# Patient Record
Sex: Male | Born: 1960
Health system: Southern US, Community
[De-identification: ages and names within clinical notes are randomized; demographics above are authoritative.]

## PROBLEM LIST (undated history)

## (undated) DIAGNOSIS — R200 Anesthesia of skin: Secondary | ICD-10-CM

## (undated) DIAGNOSIS — J45909 Unspecified asthma, uncomplicated: Secondary | ICD-10-CM

## (undated) DIAGNOSIS — R202 Paresthesia of skin: Secondary | ICD-10-CM

## (undated) DIAGNOSIS — K219 Gastro-esophageal reflux disease without esophagitis: Secondary | ICD-10-CM

## (undated) DIAGNOSIS — D229 Melanocytic nevi, unspecified: Secondary | ICD-10-CM

## (undated) DIAGNOSIS — K649 Unspecified hemorrhoids: Secondary | ICD-10-CM

## (undated) DIAGNOSIS — E229 Hyperfunction of pituitary gland, unspecified: Secondary | ICD-10-CM

## (undated) DIAGNOSIS — L309 Dermatitis, unspecified: Secondary | ICD-10-CM

## (undated) DIAGNOSIS — T8859XA Other complications of anesthesia, initial encounter: Secondary | ICD-10-CM

## (undated) DIAGNOSIS — G4730 Sleep apnea, unspecified: Secondary | ICD-10-CM

## (undated) DIAGNOSIS — C6290 Malignant neoplasm of unspecified testis, unspecified whether descended or undescended: Secondary | ICD-10-CM

## (undated) DIAGNOSIS — F411 Generalized anxiety disorder: Secondary | ICD-10-CM

## (undated) DIAGNOSIS — B009 Herpesviral infection, unspecified: Secondary | ICD-10-CM

## (undated) DIAGNOSIS — T4145XA Adverse effect of unspecified anesthetic, initial encounter: Secondary | ICD-10-CM

## (undated) HISTORY — DX: Unspecified hemorrhoids: K64.9

## (undated) HISTORY — DX: Malignant neoplasm of unspecified testis, unspecified whether descended or undescended: C62.90

## (undated) HISTORY — DX: Hyperfunction of pituitary gland, unspecified: E22.9

## (undated) HISTORY — PX: KNEE ARTHROPLASTY: SHX992

## (undated) HISTORY — DX: Melanocytic nevi, unspecified: D22.9

## (undated) HISTORY — DX: Dermatitis, unspecified: L30.9

## (undated) HISTORY — DX: Anesthesia of skin: R20.0

## (undated) HISTORY — DX: Unspecified asthma, uncomplicated: J45.909

## (undated) HISTORY — DX: Herpesviral infection, unspecified: B00.9

## (undated) HISTORY — PX: ORCHIECTOMY: SHX2116

## (undated) HISTORY — PX: LASIK: SHX215

## (undated) HISTORY — DX: Sleep apnea, unspecified: G47.30

## (undated) HISTORY — DX: Generalized anxiety disorder: F41.1

## (undated) HISTORY — DX: Paresthesia of skin: R20.2

## (undated) HISTORY — PX: APPENDECTOMY: SHX54

---

## 2003-07-01 ENCOUNTER — Ambulatory Visit (HOSPITAL_COMMUNITY): Admission: RE | Admit: 2003-07-01 | Discharge: 2003-07-01 | Payer: Self-pay | Admitting: Family Medicine

## 2003-07-01 ENCOUNTER — Encounter: Payer: Self-pay | Admitting: Family Medicine

## 2006-10-11 DIAGNOSIS — D229 Melanocytic nevi, unspecified: Secondary | ICD-10-CM

## 2006-10-11 HISTORY — DX: Melanocytic nevi, unspecified: D22.9

## 2007-03-07 ENCOUNTER — Encounter (INDEPENDENT_AMBULATORY_CARE_PROVIDER_SITE_OTHER): Payer: Self-pay | Admitting: *Deleted

## 2007-03-09 ENCOUNTER — Ambulatory Visit: Payer: Self-pay | Admitting: Internal Medicine

## 2008-04-25 ENCOUNTER — Ambulatory Visit: Payer: Self-pay | Admitting: Internal Medicine

## 2008-05-01 ENCOUNTER — Encounter (INDEPENDENT_AMBULATORY_CARE_PROVIDER_SITE_OTHER): Payer: Self-pay | Admitting: *Deleted

## 2008-06-12 ENCOUNTER — Encounter: Payer: Self-pay | Admitting: Internal Medicine

## 2009-02-07 ENCOUNTER — Encounter (INDEPENDENT_AMBULATORY_CARE_PROVIDER_SITE_OTHER): Payer: Self-pay | Admitting: *Deleted

## 2009-08-14 ENCOUNTER — Encounter: Admission: RE | Admit: 2009-08-14 | Discharge: 2009-08-14 | Payer: Self-pay | Admitting: Orthopedic Surgery

## 2009-08-20 ENCOUNTER — Ambulatory Visit (HOSPITAL_COMMUNITY): Admission: RE | Admit: 2009-08-20 | Discharge: 2009-08-20 | Payer: Self-pay | Admitting: Urology

## 2009-08-20 ENCOUNTER — Encounter (INDEPENDENT_AMBULATORY_CARE_PROVIDER_SITE_OTHER): Payer: Self-pay | Admitting: Urology

## 2009-08-25 ENCOUNTER — Ambulatory Visit: Payer: Self-pay | Admitting: Oncology

## 2009-08-26 ENCOUNTER — Ambulatory Visit: Admission: RE | Admit: 2009-08-26 | Discharge: 2009-10-01 | Payer: Self-pay | Admitting: Radiation Oncology

## 2009-12-11 ENCOUNTER — Ambulatory Visit: Payer: Self-pay | Admitting: Family Medicine

## 2009-12-11 DIAGNOSIS — C6290 Malignant neoplasm of unspecified testis, unspecified whether descended or undescended: Secondary | ICD-10-CM | POA: Insufficient documentation

## 2010-01-16 ENCOUNTER — Ambulatory Visit: Payer: Self-pay | Admitting: Internal Medicine

## 2010-01-16 DIAGNOSIS — E229 Hyperfunction of pituitary gland, unspecified: Secondary | ICD-10-CM | POA: Insufficient documentation

## 2010-01-16 LAB — CONVERTED CEMR LAB
FSH: 24.5 milliintl units/mL — ABNORMAL HIGH (ref 1.4–18.1)
TSH: 1.29 microintl units/mL (ref 0.35–5.50)
Testosterone: 346.66 ng/dL — ABNORMAL LOW (ref 350.00–890.00)

## 2010-02-04 ENCOUNTER — Encounter (INDEPENDENT_AMBULATORY_CARE_PROVIDER_SITE_OTHER): Payer: Self-pay | Admitting: *Deleted

## 2010-02-09 ENCOUNTER — Ambulatory Visit: Payer: Self-pay | Admitting: Internal Medicine

## 2010-02-12 ENCOUNTER — Encounter: Payer: Self-pay | Admitting: Family Medicine

## 2010-02-12 ENCOUNTER — Ambulatory Visit: Payer: Self-pay | Admitting: Internal Medicine

## 2010-02-27 ENCOUNTER — Ambulatory Visit: Payer: Self-pay | Admitting: Family Medicine

## 2010-02-27 LAB — CONVERTED CEMR LAB: Testosterone: 271.39 ng/dL — ABNORMAL LOW (ref 350.00–890.00)

## 2010-03-05 ENCOUNTER — Encounter: Payer: Self-pay | Admitting: Family Medicine

## 2010-03-23 ENCOUNTER — Encounter: Payer: Self-pay | Admitting: Family Medicine

## 2010-03-25 ENCOUNTER — Ambulatory Visit: Payer: Self-pay | Admitting: Cardiology

## 2010-04-28 ENCOUNTER — Ambulatory Visit: Payer: Self-pay | Admitting: Family Medicine

## 2010-04-29 LAB — CONVERTED CEMR LAB: Testosterone: 514.68 ng/dL (ref 350.00–890.00)

## 2010-08-11 ENCOUNTER — Telehealth (INDEPENDENT_AMBULATORY_CARE_PROVIDER_SITE_OTHER): Payer: Self-pay | Admitting: *Deleted

## 2010-09-10 ENCOUNTER — Encounter: Payer: Self-pay | Admitting: Family Medicine

## 2010-09-10 ENCOUNTER — Ambulatory Visit: Payer: Self-pay | Admitting: Internal Medicine

## 2010-09-18 ENCOUNTER — Ambulatory Visit: Payer: Self-pay | Admitting: Family Medicine

## 2010-09-21 ENCOUNTER — Encounter: Payer: Self-pay | Admitting: Family Medicine

## 2010-09-21 LAB — CONVERTED CEMR LAB: Testosterone: 554.87 ng/dL (ref 350.00–890.00)

## 2010-09-24 ENCOUNTER — Ambulatory Visit: Payer: Self-pay | Admitting: Internal Medicine

## 2010-10-13 ENCOUNTER — Telehealth (INDEPENDENT_AMBULATORY_CARE_PROVIDER_SITE_OTHER): Payer: Self-pay | Admitting: *Deleted

## 2010-11-01 ENCOUNTER — Encounter: Payer: Self-pay | Admitting: Urology

## 2010-11-10 NOTE — Miscellaneous (Signed)
  Clinical Lists Changes  Orders: Added new Referral order of Radiology Referral (Radiology) - Signed 

## 2010-11-10 NOTE — Miscellaneous (Signed)
Summary: PPD  Clinical Lists Changes  Orders: Added new Service order of TB Skin Test [phone]) - Signed Added new Service order of Admin 1st Vaccine (34742) - Signed Observations: Added new observation of TB PPDRESULT: negative (02/04/2010 9:56) Added new observation of PPD RESULT: < 5mm (02/04/2010 9:56) Added new observation of TB-PPD RDDTE: 02/06/2010 (02/04/2010 9:56) Added new observation of TB-PPD LOT#: [id number] (02/04/2010 9:56) Added new observation of TB-PPD EXP: 02/16/2012 (02/04/2010 9:56) Added new observation of TB-PPD BY: Shary Decamp (02/04/2010 9:56) Added new observation of TB-PPD RTE: ID (02/04/2010 9:56) Added new observation of TB-PPD DSE: 0.1 ml (02/04/2010 9:56) Added new observation of TB-PPD SITE: right forearm (02/04/2010 9:56) Added new observation of TB-PPD: PPD (02/04/2010 9:56)      Immunizations Administered:  PPD Skin Test:    Vaccine Type: PPD    Site: right forearm    Dose: 0.1 ml    Route: ID    Given by: Shary Decamp    Exp. Date: 02/16/2012    Lot #: [id number]  PPD Results    Date of reading: 02/06/2010    Results: < 5mm    Interpretation: negative

## 2010-11-10 NOTE — Miscellaneous (Signed)
Summary: Orders Update  Clinical Lists Changes  Orders: Added new Service order of Venipuncture (16109) - Signed Added new Test order of TLB-Testosterone, Total (84403-TESTO) - Signed

## 2010-11-10 NOTE — Letter (Signed)
Summary: [facility]  [facility]   Imported By: Lanelle Bal 03/16/2010 12:40:44  _____________________________________________________________________  External Attachment:    Type:   Image     Comment:   External Document

## 2010-11-10 NOTE — Assessment & Plan Note (Signed)
Summary: ppd/swh  Nurse Visit

## 2010-11-10 NOTE — Progress Notes (Signed)
Summary: RX Request  Phone Note Refill Request   Refills Requested: Medication #1:  Celebrex 100mg    Notes: 1 po prn #60 Per Dr.Hopper ok to fill med./Chrae [facility] CMA  August 11, 2010 4:13 PM      New/Updated Medications: CELEBREX 100 MG CAPS (CELECOXIB) 1 by mouth as needed Prescriptions: CELEBREX 100 MG CAPS (CELECOXIB) 1 by mouth as needed  #60 x 0   Entered by:   Shonna Chock CMA   Authorized by:   Marga Melnick MD   Signed by:   Shonna Chock CMA on 08/11/2010   Method used:   Electronically to        [facility] [address] (retail)       [address]       [place], [place]  [address]       Ph: [phone] or [phone]       Fax: [phone]   RxID:   [id number]

## 2010-11-10 NOTE — Miscellaneous (Signed)
Summary: Orders Update  Clinical Lists Changes  Orders: Added new Test order of T-2 View CXR (71020TC) - Signed 

## 2010-11-10 NOTE — Miscellaneous (Signed)
Summary: BONE DENSITY  Clinical Lists Changes  Orders: Added new Test order of T-Bone Densitometry (77080) - Signed Added new Test order of T-Lumbar Vertebral Assessment (77082) - Signed 

## 2010-11-10 NOTE — Miscellaneous (Signed)
  Clinical Lists Changes  Problems: Added new problem of HYPERPROLACTINEMIA (ICD-253.1) Orders: Added new Test order of TLB-Prolactin (84146-PROL) - Signed Added new Test order of TLB-TSH (Thyroid Stimulating Hormone) (84443-TSH) - Signed Added new Test order of TLB-FSH (Follicle Stimulating Hormone) (83001-FSH) - Signed Added new Test order of TLB-Testosterone, Total (84403-TESTO) - Signed

## 2010-11-10 NOTE — Miscellaneous (Signed)
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

## 2010-11-12 NOTE — Miscellaneous (Signed)
Summary: Orders Update   Clinical Lists Changes  Orders: Added new Referral order of Radiology Referral (Radiology) - Signed 

## 2010-11-12 NOTE — Letter (Signed)
Summary: [facility]  [facility]   Imported By: Lanelle Bal 09/21/2010 11:24:19  _____________________________________________________________________  External Attachment:    Type:   Image     Comment:   External Document

## 2010-11-12 NOTE — Progress Notes (Signed)
Summary: Refill Request  Phone Note Refill Request Call back at [phone] Message from:  Pharmacy on October 13, 2010 10:28 AM  Refills Requested: Medication #1:  CELEBREX 100 MG CAPS 1 by mouth as needed.   Dosage confirmed as above?Dosage Confirmed   Supply Requested: 60   Last Refilled: 08/11/2010 Karin Golden on [address]   Next Appointment Scheduled: none Initial call taken by: Harold Barban,  October 13, 2010 10:29 AM    Prescriptions: CELEBREX 100 MG CAPS (CELECOXIB) 1 by mouth as needed  #60 x 1   Entered by:   Shonna Chock CMA   Authorized by:   Marga Melnick MD   Signed by:   Shonna Chock CMA on 10/13/2010   Method used:   Electronically to        [facility] [address] (retail)       [address]       [place], [place]  [address]       Ph: [phone] or [phone]       Fax: [phone]   RxID:   [id number]

## 2010-12-11 ENCOUNTER — Encounter: Payer: Self-pay | Admitting: Pulmonary Disease

## 2010-12-11 ENCOUNTER — Institutional Professional Consult (permissible substitution) (INDEPENDENT_AMBULATORY_CARE_PROVIDER_SITE_OTHER): Payer: Commercial Managed Care - PPO | Admitting: Pulmonary Disease

## 2010-12-11 DIAGNOSIS — J45909 Unspecified asthma, uncomplicated: Secondary | ICD-10-CM | POA: Insufficient documentation

## 2010-12-11 DIAGNOSIS — R0609 Other forms of dyspnea: Secondary | ICD-10-CM

## 2010-12-11 DIAGNOSIS — F411 Generalized anxiety disorder: Secondary | ICD-10-CM | POA: Insufficient documentation

## 2010-12-11 DIAGNOSIS — R0989 Other specified symptoms and signs involving the circulatory and respiratory systems: Secondary | ICD-10-CM | POA: Insufficient documentation

## 2010-12-11 DIAGNOSIS — B009 Herpesviral infection, unspecified: Secondary | ICD-10-CM | POA: Insufficient documentation

## 2010-12-15 ENCOUNTER — Encounter: Payer: Self-pay | Admitting: Pulmonary Disease

## 2010-12-16 ENCOUNTER — Institutional Professional Consult (permissible substitution): Payer: Self-pay | Admitting: Pulmonary Disease

## 2010-12-17 NOTE — Assessment & Plan Note (Addendum)
Summary: consult for possible osa   Visit Type:  Initial Consult Copy to:  Loreen Freud Primary Provider/Referring Provider:  Loreen Freud DO  CC:  Sleep Consult.  History of Present Illness: the pt is a 49y/o male who comes in today for evaluation of possible osa.  He has been having worsening snoring according to his wife, has noted an abnormal breathing pattern during sleep.  Sleeps btw 11pm-6am, and is not completely rested in am upon arising.  He awakens a lot during the night primarily with his wife being concerned about his breathing.  He does note sleep pressure during the day on his afternoons off, and with tv/movies in the evening.  He denies any sleepiness with driving.  His weight is neutral over the last 2 years,and his epworth score today is 9.    Current Medications (verified): 1)  Celebrex 100 Mg Caps (Celecoxib) .Marland Kitchen.. 1 By Mouth As Needed 2)  Androgel Pump 20.25 Mg/act (1.62%) Gel (Testosterone) .... 4 Pumps Once Daily 3)  Symbicort 80-4.5 Mcg/act Aero (Budesonide-Formoterol Fumarate) .... 2 Puffs Once Daily 4)  Proair Hfa 108 (90 Base) Mcg/act Aers (Albuterol Sulfate) .... 2 Puffs Qid As Needed 5)  Prozac 20 Mg Caps (Fluoxetine Hcl) .... Take 1 Capsule By Mouth Once A Day 6)  Alprazolam 0.5 Mg Tabs (Alprazolam) .... Take 1 Tab By Mouth At Bedtime As Needed Insomnia  Allergies (verified): 1)  ! 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 no diabetes no coronary artery disease no colon cancer no prostate  cancer Sjogren, mother colon polyps, father dementia, father, onset age 75 laryngeal cancer, father, dx age  ~ 38  melanoma , brother , dx age  ~59  atypical nevus ,niece dx age  ~ 31  Social History: Reviewed history and no changes required. 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  The patient denies shortness of breath with activity, shortness of breath at rest, productive cough, non-productive cough, coughing up blood, chest pain, irregular heartbeats, indigestion, loss of appetite, weight change, abdominal pain, sore throat, tooth/dental problems, nasal congestion/difficulty breathing through nose, sneezing, itching, ear ache, anxiety, depression, hand/feet swelling, joint stiffness or pain, rash, change in color of mucus, and fever.    Vital Signs:  Patient profile:   50 year old male Height:      70 inches Weight:      225.13 pounds BMI:     32.42 O2 Sat:      96 % on Room air Temp:     98.1 degrees F oral Pulse rate:   70 / minute BP sitting:   116 / 78  (left arm) Cuff size:   large  Vitals Entered By: Arman Filter LPN (December 10, 1608 4:21 PM)  O2 Flow:  Room air CC: Sleep Consult Comments Medications reviewed with patient Arman Filter LPN  December 11, 9602 4:21 PM  Physical Exam  General:  ow male in nad  Eyes:  PERRLA and EOMI.   Nose:  narrowed bilat, but patent no purulence or discharge seen Mouth:  mild to moderate elongation of soft palate and uvula noted. Neck:  no LN, tmg Lungs:  clear to auscultation  Heart:  rrr Extremities:  no edema or cyanosis  Neurologic:  awake, but appears mildly sleepy moves all 4    Impression & Recommendations:  Problem # 1:  SNORING (ICD-786.09)  the pt has worsening  snoring and a hx that is suggestive of osa.  He is having some degree of sleep disruption, as well as some sleep pressure during the day with periods of inactivity.  At this point, he would benefit from home sleep testing given his lack of underlying cardiopulmonary disease.  Will call with results once available.    Medications Added to Medication List This Visit: 1)  Androgel Pump 20.25 Mg/act (1.62%) Gel (Testosterone) .... 4 pumps once daily 2)  Symbicort 80-4.5 Mcg/act Aero (Budesonide-formoterol fumarate) .... 2 puffs once daily 3)  Proair Hfa 108 (90 Base) Mcg/act Aers (Albuterol sulfate) .... 2 puffs qid as needed 4)  Prozac 20 Mg Caps (Fluoxetine hcl) .... Take 1 capsule by mouth once a day 5)  Alprazolam 0.5 Mg Tabs (Alprazolam) .... Take 1 tab by mouth at bedtime as needed insomnia  Other Orders: No Charge Patient Arrived (NCPA0) (NCPA0) Misc. Referral (Misc. Ref)  Patient Instructions: 1)  will do home sleep testing to evaluate for sleep disordered breathing. 2)  work on weight loss.  Appended Document: consult for possible osa home sleep testing reveals AHI 22/hr with desat to 77%.  Discussed results with pt, and he wishes to work on weight loss and possibly consider a dental appliance.  He has discussed with his dentist.  He is to call me if he wishes to consider cpap.

## 2010-12-29 NOTE — Procedures (Signed)
Summary: [facility]   Imported By: Lester [place] 12/21/2010 08:32:39  _____________________________________________________________________  External Attachment:    Type:   Image     Comment:   External Document

## 2011-02-02 ENCOUNTER — Other Ambulatory Visit (INDEPENDENT_AMBULATORY_CARE_PROVIDER_SITE_OTHER): Payer: Commercial Managed Care - PPO

## 2011-02-02 DIAGNOSIS — R7989 Other specified abnormal findings of blood chemistry: Secondary | ICD-10-CM

## 2011-02-02 LAB — HEPATIC FUNCTION PANEL
AST: 70 U/L — ABNORMAL HIGH (ref 0–37)
Total Bilirubin: 0.6 mg/dL (ref 0.3–1.2)

## 2011-02-02 LAB — PROLACTIN: Prolactin: 4.6 ng/mL (ref 2.1–17.1)

## 2011-03-18 ENCOUNTER — Ambulatory Visit (INDEPENDENT_AMBULATORY_CARE_PROVIDER_SITE_OTHER)
Admission: RE | Admit: 2011-03-18 | Discharge: 2011-03-18 | Disposition: A | Discharge status: Home / Self Care | Payer: Commercial Managed Care - PPO | Source: Ambulatory Visit | Attending: Internal Medicine | Admitting: Internal Medicine

## 2011-03-18 ENCOUNTER — Other Ambulatory Visit: Payer: Self-pay | Admitting: Internal Medicine

## 2011-03-18 DIAGNOSIS — C6290 Malignant neoplasm of unspecified testis, unspecified whether descended or undescended: Secondary | ICD-10-CM

## 2011-03-31 ENCOUNTER — Encounter: Payer: Self-pay | Admitting: Internal Medicine

## 2011-03-31 ENCOUNTER — Ambulatory Visit (INDEPENDENT_AMBULATORY_CARE_PROVIDER_SITE_OTHER): Payer: Commercial Managed Care - PPO | Admitting: Internal Medicine

## 2011-03-31 VITALS — BP 110/72 | HR 84 | Ht 70.0 in | Wt 223.0 lb

## 2011-03-31 DIAGNOSIS — B354 Tinea corporis: Secondary | ICD-10-CM

## 2011-03-31 DIAGNOSIS — Z8371 Family history of colonic polyps: Secondary | ICD-10-CM

## 2011-03-31 DIAGNOSIS — K602 Anal fissure, unspecified: Secondary | ICD-10-CM

## 2011-03-31 DIAGNOSIS — K219 Gastro-esophageal reflux disease without esophagitis: Secondary | ICD-10-CM

## 2011-03-31 MED ORDER — AMBULATORY NON FORMULARY MEDICATION: Status: DC

## 2011-03-31 NOTE — Progress Notes (Signed)
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 He presents today regarding intermittent rectal discomfort and pruritus. Other issues identified include chronic GERD and elevated hepatic transaminases. First, he reports 3 episodes, over the past several years, of recurrent sharp rectal pain exacerbated by bowel movements. The problem generally lasts 4 weeks, then resolves. Topical steroid and analgesic agents have been used with some relief. He does report chronic constipation. No bleeding with pain. Next, problems with perianal itching and irritation of the skin with some cutaneous bleeding. He has seen a dermatologist for this issue. Different therapies applied, including steroids and antifungals, for variable durations, with uncertain results. He has not noticed that this problem is affected by dietary manipulation. Next, problems with GERD for years. Occasionally severe and requiring on demand PPI use (most recently Dexilant for 4 weeks). No dysphagia. No family history of GI malignancy, though his father had a history of gastric ulcer as well as colon polyps at an advanced age. Finally, laboratories from 02/02/2011 showed elevated hepatic transaminases with an ALT level of 105 and an AST level of 70. The remainder of the liver tests as well as protein and albumin were normal. He has addressed this with his endocrinologist, both thinking that testosterone replacement therapy may be effecting the liver tests. This has been stopped and there are plans for followup blood work in the near future. Of interest, he has had a prior history of milder elevation of liver tests for which he underwent a workup, about 10 years ago. He reports that this included viral studies, and testing for iron and copper storage disorders. These were reported to be unremarkable. Further review of the record, finds a CT scan from December 2011  revealing a normal liver. His weight has fluctuated from a maximum of 235 pounds. Currently 223 pounds and down 8 pounds over the past 2 months- due to diet. No family history of liver disease or excessive alcohol use.  REVIEW OF SYSTEMS:  All non-GI ROS negative.  Past Medical History  Diagnosis Date  . Herpes simplex without mention of complication   . Unspecified asthma   . Anxiety state, unspecified   . Other and unspecified anterior pituitary hyperfunction   . 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     Past Surgical History  Procedure Date  . Lasik   . Appendectomy   . Orchiectomy     Lt. 08/2009    Social History Daniel Lam  reports that he has quit smoking. He has never used smokeless tobacco. He reports that he drinks alcohol. He reports that he does not use illicit drugs.  family history includes Colon polyps in his father; Dementia in his father; Melanoma in his brother; Sjogren's syndrome in his mother; and Throat cancer in an unspecified family member.  There is no history of Colon cancer.  Allergies  Allergen Reactions  . Latex     REACTION: SOB, rash, itching       PHYSICAL EXAMINATION: Vital signs: BP 110/72  Pulse 84  Ht 5\' 10"  (1.778 m)  Wt 223 lb (101.152 kg)  BMI 32.00 kg/m2  Constitutional: generally well-appearing, no acute distress Psychiatric: alert and oriented x3, cooperative Eyes: extraocular movements intact, anicteric, conjunctiva pink Mouth: oral pharynx moist, no lesions Neck: supple no lymphadenopathy Cardiovascular: heart regular rate and rhythm, no murmur Lungs: clear to auscultation bilaterally Abdomen: soft, nontender, nondistended, no  obvious ascites, no peritoneal signs, normal bowel sounds, no organomegaly Rectal: Posterior fissure with some tenderness. Sentinel tag. Normal prostate. Hemoccult negative stool. Extremities: no lower extremity edema bilaterally Skin: . Skin breakdown in the intertriginous region of the  buttock inferiorly. no lesions on visible extremities Neuro: No obvious focal deficits.   ASSESSMENT:  #1. Recurrent problems with rectal pain due to posterior anal fissure. #2. Cutaneous skin breakdown in the intertriginous region of the buttock consistent with fungal infection #3. Chronic GERD without alarm features #4. Elevated hepatic transaminases, likely chronic and possibly due to fatty liver. #5. Family history of colon polyps #6. Chronic constipation reported   PLAN:  #1. 2% diltiazem cream applied to the anal sphincter 4-5 times daily as needed for recurrent fissure #2. Lamisil cream twice a day to affected skin until problem resolved #3. Reflux precautions with attention to weight loss. Continue with on demand PPI use #4. Consider screening upper endoscopy for chronic GERD. We discussed this. #5. Return for reevaluation of elevated hepatic transaminases, if this problem persists despite discontinuation of testosterone product. #6. Regular exercise and gradual sensible weight loss #7. Due for routine screening colonoscopy at age 13

## 2011-03-31 NOTE — Patient Instructions (Signed)
Diltiazem cream 2% 4-5 times to anus daily Use Lamisil cream twice daily to affected skin x 4 weeks. Pad dry area well Call if you have any problems.

## 2011-07-06 ENCOUNTER — Encounter: Payer: Self-pay | Admitting: Internal Medicine

## 2011-07-06 ENCOUNTER — Other Ambulatory Visit: Payer: Self-pay | Admitting: Internal Medicine

## 2011-07-06 ENCOUNTER — Other Ambulatory Visit (INDEPENDENT_AMBULATORY_CARE_PROVIDER_SITE_OTHER): Payer: Commercial Managed Care - PPO

## 2011-07-06 DIAGNOSIS — E291 Testicular hypofunction: Secondary | ICD-10-CM

## 2011-07-06 DIAGNOSIS — R7989 Other specified abnormal findings of blood chemistry: Secondary | ICD-10-CM

## 2011-07-06 DIAGNOSIS — R945 Abnormal results of liver function studies: Secondary | ICD-10-CM

## 2011-07-06 LAB — HEPATIC FUNCTION PANEL
ALT: 68 U/L — ABNORMAL HIGH (ref 0–53)
AST: 44 U/L — ABNORMAL HIGH (ref 0–37)
Albumin: 4.1 g/dL (ref 3.5–5.2)
Alkaline Phosphatase: 87 U/L (ref 39–117)
Total Protein: 6.9 g/dL (ref 6.0–8.3)

## 2011-07-06 LAB — TESTOSTERONE: Testosterone: 268.11 ng/dL — ABNORMAL LOW (ref 350.00–890.00)

## 2011-07-06 NOTE — Progress Notes (Signed)
Labs only

## 2011-07-11 ENCOUNTER — Other Ambulatory Visit: Payer: Self-pay | Admitting: Internal Medicine

## 2011-07-19 ENCOUNTER — Other Ambulatory Visit: Payer: Self-pay | Admitting: Internal Medicine

## 2011-07-19 DIAGNOSIS — C6290 Malignant neoplasm of unspecified testis, unspecified whether descended or undescended: Secondary | ICD-10-CM

## 2011-07-22 ENCOUNTER — Ambulatory Visit (INDEPENDENT_AMBULATORY_CARE_PROVIDER_SITE_OTHER)
Admission: RE | Admit: 2011-07-22 | Discharge: 2011-07-22 | Disposition: A | Discharge status: Home / Self Care | Payer: Commercial Managed Care - PPO | Source: Ambulatory Visit | Attending: Internal Medicine | Admitting: Internal Medicine

## 2011-07-22 DIAGNOSIS — C6290 Malignant neoplasm of unspecified testis, unspecified whether descended or undescended: Secondary | ICD-10-CM

## 2011-07-22 MED ORDER — IOHEXOL 300 MG/ML  SOLN
100.0000 mL | Freq: Once | INTRAMUSCULAR | Status: AC | PRN
  Administered 2011-07-22: 100 mL via INTRAVENOUS

## 2011-08-02 ENCOUNTER — Other Ambulatory Visit: Payer: Self-pay | Admitting: Orthopedic Surgery

## 2011-08-02 DIAGNOSIS — M25562 Pain in left knee: Secondary | ICD-10-CM

## 2011-08-05 ENCOUNTER — Ambulatory Visit
Admission: RE | Admit: 2011-08-05 | Discharge: 2011-08-05 | Disposition: A | Discharge status: Home / Self Care | Payer: Commercial Managed Care - PPO | Source: Ambulatory Visit | Attending: Orthopedic Surgery | Admitting: Orthopedic Surgery

## 2011-08-05 DIAGNOSIS — M25562 Pain in left knee: Secondary | ICD-10-CM

## 2011-08-27 ENCOUNTER — Encounter (HOSPITAL_BASED_OUTPATIENT_CLINIC_OR_DEPARTMENT_OTHER): Payer: Self-pay | Admitting: *Deleted

## 2011-08-27 NOTE — Progress Notes (Signed)
Pt [place] md-will get ekg in office-will be in epic

## 2011-08-30 ENCOUNTER — Other Ambulatory Visit: Payer: Self-pay | Admitting: Internal Medicine

## 2011-08-30 ENCOUNTER — Ambulatory Visit (INDEPENDENT_AMBULATORY_CARE_PROVIDER_SITE_OTHER): Payer: 59 | Admitting: Internal Medicine

## 2011-08-30 DIAGNOSIS — Z01818 Encounter for other preprocedural examination: Secondary | ICD-10-CM

## 2011-08-31 NOTE — H&P (Signed)
PREOPERATIVE H&P  Chief Complaint: l. kne pain w/ meniscal tear  HPI: Daniel Lam is a 50 y.o. male who presents for evaluation of l. Knee pain. It has been present for greater than 6 months and has been worsening. He has failed conservative measures. Pain is rated as moderate.  Past Medical History  Diagnosis Date  . Herpes simplex without mention of complication   . Anxiety state, unspecified   . Other and unspecified anterior pituitary hyperfunction   . Complication of anesthesia     had some difficulty voiding  . Unspecified asthma     controlled  . 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     testicular-radiation post op   Past Surgical History  Procedure Date  . Lasik   . Appendectomy   . Orchiectomy     Lt. 08/2009   History   Social History  . Marital Status: Married    Spouse Name: N/A    Number of Children: 3  . Years of Education: N/A   Occupational History  . physician     internal medicine   Social History Main Topics  . Smoking status: Former Games developer  . Smokeless tobacco: Never Used  . Alcohol Use: Yes  . Drug Use: No  . Sexually Active: None   Other Topics Concern  . None   Social History Narrative  . None   Family History  Problem Relation Age of Onset  . Colon cancer Neg Hx   . Sjogren's syndrome Mother   . Colon polyps Father   . Dementia Father   . Throat cancer      Larynx  . Melanoma Brother    Allergies  Allergen Reactions  . Demerol (Meperidine Hcl)     Difficulty voiding  . Latex     REACTION: SOB, rash, itching  . Morphine And Related     Difficulty voiding   Prior to Admission medications   Medication Sig Start Date End Date Taking? Authorizing Provider  albuterol (PROAIR HFA) 108 (90 BASE) MCG/ACT inhaler Inhale 2 puffs into the lungs every 4 (four) hours as needed.      Historical Provider, MD  ALPRAZolam Prudy Feeler) 0.5 MG tablet Take 0.5 mg by mouth at bedtime as needed.      Historical Provider, MD    AMBULATORY NON FORMULARY MEDICATION Medication Name: Diltiazem Cream 2% Apply 4-5 times daily to anus   [facility] 03/31/11   Yancey Flemings, MD  celecoxib (CELEBREX) 100 MG capsule Take 100 mg by mouth as needed.      Historical Provider, MD  FLUoxetine (PROZAC) 20 MG capsule Take 20 mg by mouth daily.      Historical Provider, MD  valACYclovir (VALTREX) 1000 MG tablet TAKE ONE TABLET BY MOUTH DAILY 07/11/11   Lelon Perla, DO     Positive ROS: none  All other systems have been reviewed and were otherwise negative with the exception of those mentioned in the HPI and as above.  Physical Exam: There were no vitals filed for this visit.  General: Alert, no acute distress Cardiovascular: No pedal edema Respiratory: No cyanosis, no use of accessory musculature GI: No organomegaly, abdomen is soft and non-tender Skin: No lesions in the area of chief complaint Neurologic: Sensation intact distally Psychiatric: Patient is competent for consent with normal mood and affect Lymphatic: No axillary or cervical lymphadenopathy  MUSCULOSKELETAL: l. Knee + mcmurray / neg lochman/ -pivot shift/ + med side knee pain  Assessment/Plan: mmt  lt knee Plan for Procedure(s): ARTHROSCOPY KNEE  The risks benefits and alternatives were discussed with the patient including but not limited to the risks of nonoperative treatment, versus surgical intervention including infection, bleeding, nerve injury, malunion, nonunion, hardware prominence, hardware failure, need for hardware removal, blood clots, cardiopulmonary complications, morbidity, mortality, among others, and they were willing to proceed.  Predicted outcome is good, although there will be at least a six to nine month expected recovery.  Daniel Lam L 08/31/2011 6:10 PM

## 2011-08-31 NOTE — Progress Notes (Signed)
  Subjective:    Patient ID: Daniel Lam, male    DOB: October 14, 1960, 50 y.o.   MRN: [id number]  HPI EKG needed for preop    Review of Systems     Objective:   Physical Exam        Assessment & Plan:  EKG performed

## 2011-09-01 ENCOUNTER — Ambulatory Visit (HOSPITAL_BASED_OUTPATIENT_CLINIC_OR_DEPARTMENT_OTHER)
Admission: RE | Admit: 2011-09-01 | Discharge: 2011-09-01 | Disposition: A | Discharge status: Home Health | Payer: 59 | Source: Ambulatory Visit | Attending: Orthopedic Surgery | Admitting: Orthopedic Surgery

## 2011-09-01 ENCOUNTER — Encounter (HOSPITAL_BASED_OUTPATIENT_CLINIC_OR_DEPARTMENT_OTHER): Payer: Self-pay | Admitting: Anesthesiology

## 2011-09-01 ENCOUNTER — Encounter (HOSPITAL_BASED_OUTPATIENT_CLINIC_OR_DEPARTMENT_OTHER): Payer: Self-pay

## 2011-09-01 ENCOUNTER — Encounter (HOSPITAL_BASED_OUTPATIENT_CLINIC_OR_DEPARTMENT_OTHER)
Admission: RE | Disposition: A | Discharge status: Home Health | Payer: Self-pay | Source: Ambulatory Visit | Attending: Orthopedic Surgery

## 2011-09-01 ENCOUNTER — Encounter (HOSPITAL_BASED_OUTPATIENT_CLINIC_OR_DEPARTMENT_OTHER): Payer: Self-pay | Admitting: *Deleted

## 2011-09-01 ENCOUNTER — Ambulatory Visit (HOSPITAL_BASED_OUTPATIENT_CLINIC_OR_DEPARTMENT_OTHER): Payer: 59 | Admitting: Anesthesiology

## 2011-09-01 DIAGNOSIS — Z01812 Encounter for preprocedural laboratory examination: Secondary | ICD-10-CM | POA: Insufficient documentation

## 2011-09-01 DIAGNOSIS — J45909 Unspecified asthma, uncomplicated: Secondary | ICD-10-CM | POA: Insufficient documentation

## 2011-09-01 DIAGNOSIS — M23305 Other meniscus derangements, unspecified medial meniscus, unspecified knee: Secondary | ICD-10-CM | POA: Insufficient documentation

## 2011-09-01 HISTORY — DX: Other complications of anesthesia, initial encounter: T88.59XA

## 2011-09-01 HISTORY — PX: KNEE ARTHROSCOPY: SHX127

## 2011-09-01 HISTORY — DX: Adverse effect of unspecified anesthetic, initial encounter: T41.45XA

## 2011-09-01 LAB — POCT HEMOGLOBIN-HEMACUE: Hemoglobin: 14.4 g/dL (ref 13.0–17.0)

## 2011-09-01 SURGERY — ARTHROSCOPY, KNEE
Anesthesia: General | Site: Knee | Laterality: Left | Wound class: Clean

## 2011-09-01 MED ORDER — IBUPROFEN 200 MG PO TABS: 200.0000 mg | ORAL_TABLET | Freq: Four times a day (QID) | ORAL | Status: DC | PRN

## 2011-09-01 MED ORDER — CEFAZOLIN SODIUM 1-5 GM-% IV SOLN
1.0000 g | INTRAVENOUS | Status: AC
  Administered 2011-09-01: 2 g via INTRAVENOUS

## 2011-09-01 MED ORDER — FENTANYL CITRATE 0.05 MG/ML IJ SOLN
INTRAMUSCULAR | Status: DC | PRN
  Administered 2011-09-01: 50 ug via INTRAVENOUS
  Administered 2011-09-01 (×2): 25 ug via INTRAVENOUS
  Administered 2011-09-01: 50 ug via INTRAVENOUS

## 2011-09-01 MED ORDER — MIDAZOLAM HCL 5 MG/5ML IJ SOLN
INTRAMUSCULAR | Status: DC | PRN
  Administered 2011-09-01: 1 mg via INTRAVENOUS

## 2011-09-01 MED ORDER — CHLORHEXIDINE GLUCONATE 4 % EX LIQD: 60.0000 mL | Freq: Once | CUTANEOUS | Status: DC

## 2011-09-01 MED ORDER — LACTATED RINGERS IV SOLN: 500.0000 mL | INTRAVENOUS | Status: DC

## 2011-09-01 MED ORDER — OXYMETAZOLINE HCL 0.05 % NA SOLN
2.0000 | Freq: Once | NASAL | Status: DC
Start: 2011-09-01 — End: 2011-09-01

## 2011-09-01 MED ORDER — ONDANSETRON HCL 4 MG/2ML IJ SOLN
INTRAMUSCULAR | Status: DC | PRN
  Administered 2011-09-01: 4 mg via INTRAVENOUS

## 2011-09-01 MED ORDER — METOCLOPRAMIDE HCL 5 MG/ML IJ SOLN
INTRAMUSCULAR | Status: DC | PRN
  Administered 2011-09-01: 10 mg via INTRAVENOUS

## 2011-09-01 MED ORDER — OXYCODONE-ACETAMINOPHEN 5-325 MG PO TABS: 1.0000 | ORAL_TABLET | Freq: Four times a day (QID) | ORAL | Status: AC | PRN

## 2011-09-01 MED ORDER — LACTATED RINGERS IV SOLN: INTRAVENOUS | Status: DC

## 2011-09-01 MED ORDER — SODIUM CHLORIDE 0.9 % IR SOLN
Status: DC | PRN
  Administered 2011-09-01: 3000 mL

## 2011-09-01 MED ORDER — MIDAZOLAM HCL 2 MG/2ML IJ SOLN: 0.5000 mg | INTRAMUSCULAR | Status: DC | PRN

## 2011-09-01 MED ORDER — MIDAZOLAM HCL 2 MG/2ML IJ SOLN: 1.0000 mg | INTRAMUSCULAR | Status: DC | PRN

## 2011-09-01 MED ORDER — DEXAMETHASONE SODIUM PHOSPHATE 4 MG/ML IJ SOLN
INTRAMUSCULAR | Status: DC | PRN
  Administered 2011-09-01: 10 mg via INTRAVENOUS

## 2011-09-01 MED ORDER — LIDOCAINE HCL (CARDIAC) 20 MG/ML IV SOLN
INTRAVENOUS | Status: DC | PRN
  Administered 2011-09-01: 75 mg via INTRAVENOUS

## 2011-09-01 MED ORDER — KETOROLAC TROMETHAMINE 30 MG/ML IJ SOLN
15.0000 mg | Freq: Once | INTRAMUSCULAR | Status: AC | PRN
  Administered 2011-09-01: 30 mg via INTRAVENOUS

## 2011-09-01 MED ORDER — BUPIVACAINE HCL (PF) 0.5 % IJ SOLN
INTRAMUSCULAR | Status: DC | PRN
  Administered 2011-09-01: 20 mL

## 2011-09-01 MED ORDER — OXYCODONE-ACETAMINOPHEN 5-325 MG PO TABS
1.0000 | ORAL_TABLET | Freq: Once | ORAL | Status: AC
  Administered 2011-09-01: 1 via ORAL

## 2011-09-01 MED ORDER — POVIDONE-IODINE 7.5 % EX SOLN: Freq: Once | CUTANEOUS | Status: DC

## 2011-09-01 MED ORDER — LACTATED RINGERS IV SOLN
INTRAVENOUS | Status: DC
  Administered 2011-09-01 (×2): via INTRAVENOUS

## 2011-09-01 MED ORDER — METOCLOPRAMIDE HCL 5 MG/ML IJ SOLN: 10.0000 mg | Freq: Once | INTRAMUSCULAR | Status: DC | PRN

## 2011-09-01 MED ORDER — LIDOCAINE-PRILOCAINE 2.5-2.5 % EX CREA: 1.0000 "application " | TOPICAL_CREAM | Freq: Once | CUTANEOUS | Status: DC

## 2011-09-01 MED ORDER — PROPOFOL 10 MG/ML IV EMUL
INTRAVENOUS | Status: DC | PRN
  Administered 2011-09-01: 300 mg via INTRAVENOUS

## 2011-09-01 SURGICAL SUPPLY — 42 items
BANDAGE ELASTIC 6 VELCRO ST LF (GAUZE/BANDAGES/DRESSINGS) ×2 IMPLANT
BLADE 4.2CUDA (BLADE) IMPLANT
BLADE GREAT WHITE 4.2 (BLADE) ×2 IMPLANT
CANISTER OMNI JUG 16 LITER (MISCELLANEOUS) IMPLANT
CANISTER SUCTION 2500CC (MISCELLANEOUS) IMPLANT
CLOTH BEACON ORANGE TIMEOUT ST (SAFETY) ×2 IMPLANT
CUTTER MENISCUS  4.2MM (BLADE)
CUTTER MENISCUS 4.2MM (BLADE) IMPLANT
DRAPE ARTHROSCOPY W/POUCH 114 (DRAPES) ×2 IMPLANT
DRSG EMULSION OIL 3X3 NADH (GAUZE/BANDAGES/DRESSINGS) ×2 IMPLANT
DURAPREP 26ML APPLICATOR (WOUND CARE) ×2 IMPLANT
ELECT MENISCUS 165MM 90D (ELECTRODE) IMPLANT
ELECT REM PT RETURN 9FT ADLT (ELECTROSURGICAL)
ELECTRODE REM PT RTRN 9FT ADLT (ELECTROSURGICAL) IMPLANT
GLOVE BIOGEL PI IND STRL 7.0 (GLOVE) IMPLANT
GLOVE BIOGEL PI IND STRL 8 (GLOVE) ×2 IMPLANT
GLOVE BIOGEL PI INDICATOR 7.0 (GLOVE) ×1
GLOVE BIOGEL PI INDICATOR 8 (GLOVE) ×2
GLOVE ECLIPSE 7.5 STRL STRAW (GLOVE) ×4 IMPLANT
GLOVE SKINSENSE NS SZ7.5 (GLOVE) ×2
GLOVE SKINSENSE STRL SZ7.5 (GLOVE) IMPLANT
GOWN BRE IMP PREV XXLGXLNG (GOWN DISPOSABLE) ×2 IMPLANT
GOWN PREVENTION PLUS XLARGE (GOWN DISPOSABLE) ×2 IMPLANT
GOWN PREVENTION PLUS XXLARGE (GOWN DISPOSABLE) ×2 IMPLANT
HOLDER KNEE FOAM BLUE (MISCELLANEOUS) ×2 IMPLANT
KNEE WRAP E Z 3 GEL PACK (MISCELLANEOUS) ×2 IMPLANT
NDL SAFETY ECLIPSE 18X1.5 (NEEDLE) ×1 IMPLANT
NEEDLE HYPO 18GX1.5 SHARP (NEEDLE) ×2
PACK ARTHROSCOPY DSU (CUSTOM PROCEDURE TRAY) ×2 IMPLANT
PACK BASIN DAY SURGERY FS (CUSTOM PROCEDURE TRAY) ×2 IMPLANT
PAD CAST 4YDX4 CTTN HI CHSV (CAST SUPPLIES) ×1 IMPLANT
PADDING CAST COTTON 4X4 STRL (CAST SUPPLIES) ×2
PADDING WEBRIL 4 STERILE (GAUZE/BANDAGES/DRESSINGS) ×1 IMPLANT
PENCIL BUTTON HOLSTER BLD 10FT (ELECTRODE) IMPLANT
SET ARTHROSCOPY TUBING (MISCELLANEOUS) ×2
SET ARTHROSCOPY TUBING LN (MISCELLANEOUS) ×1 IMPLANT
SPONGE GAUZE 4X4 12PLY (GAUZE/BANDAGES/DRESSINGS) ×2 IMPLANT
SUT ETHILON 4 0 PS 2 18 (SUTURE) IMPLANT
SYR 5ML LL (SYRINGE) ×2 IMPLANT
TOWEL OR 17X24 6PK STRL BLUE (TOWEL DISPOSABLE) ×2 IMPLANT
TOWEL OR NON WOVEN STRL DISP B (DISPOSABLE) ×2 IMPLANT
WATER STERILE IRR 1000ML POUR (IV SOLUTION) ×2 IMPLANT

## 2011-09-01 NOTE — Transfer of Care (Signed)
Immediate Anesthesia Transfer of Care Note  Patient: Daniel Lam  Procedure(s) Performed:  ARTHROSCOPY KNEE - medial and lateral meniscus tear debridment  and chondroplasty  Patient Location: PACU  Anesthesia Type: General  Level of Consciousness: awake, oriented and patient cooperative  Airway & Oxygen Therapy: Patient Spontanous Breathing and Patient connected to face mask oxygen  Post-op Assessment: Report given to PACU RN, Post -op Vital signs reviewed and stable and Patient moving all extremities X 4  Post vital signs: Reviewed and stable  Complications: No apparent anesthesia complications

## 2011-09-01 NOTE — Op Note (Signed)
Dictated # [id number]

## 2011-09-01 NOTE — Anesthesia Postprocedure Evaluation (Signed)
Anesthesia Post Note  Patient: Daniel Lam  Procedure(s) Performed:  ARTHROSCOPY KNEE - medial and lateral meniscus tear debridment  and chondroplasty  Anesthesia type: General  Patient location: PACU  Post pain: Pain level controlled  Post assessment: Patient's Cardiovascular Status Stable  Last Vitals:  Filed Vitals:   09/01/11 1101  BP: 113/69  Pulse: 73  Temp: 36.7 C  Resp: 16    Post vital signs: Reviewed and stable  Level of consciousness: sedated  Complications: No apparent anesthesia complications

## 2011-09-01 NOTE — Op Note (Signed)
NAME:  Berkheimer, Monty                         ACCOUNT NO.:  MEDICAL RECORD NO.:  [id number]  LOCATION:                                 FACILITY:  PHYSICIAN:  Harvie Junior, M.D.        DATE OF BIRTH:  DATE OF PROCEDURE:  09/01/2011 DATE OF DISCHARGE:                              OPERATIVE REPORT   PREOPERATIVE DIAGNOSIS:  Medial and lateral meniscal tear.  POSTOPERATIVE DIAGNOSES: 1. Medial and lateral meniscal tear. 2. Chondromalacia patella.  PROCEDURES: 1. Partial medial and partial posterior horn medial meniscectomy with     partial anterior horn and lateral meniscectomy corresponding     debridement in the medial compartment. 2. Debridement of chondromalacia patella.  SURGEON:  Harvie Junior, MD  ASSISTANT:  Marshia Ly, PA  ANESTHESIA:  General.  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  We treated him conservatively for almost a year.  He was continued to have pain, catching grabbing pain. MRI was obtained, showed he had a complex posterior horn medial meniscal tear, showed that he had a complex anterior horn lateral meniscal tear. We talked about treatment options and felt that arthroscopy is the most appropriate course of action.  He again attempted conservative care, but after failure of conservative care called and requested surgical intervention.  He was brought to the operating room for this intervention.  PROCEDURE:  The patient was taken to the operating room.  After adequate anesthesia was obtained under general anesthetic, the patient was placed supine on the operating table.  Left leg was prepped and draped in the usual sterile fashion.  Following routine arthroscopic examination, the knee revealed there was an obvious posterior horn medial meniscal tear. This was complex in nature going back to the rim.  This was debrided with a combination of straight-biting forceps and upbiting forceps  on the left-hand side.  Bony portions of remaining meniscal rim was contoured down to a smooth and non-catching section.  Following this, attention was turned to the medial femoral condyle which had minimal degenerative change.  Attention was turned to the ACL, normal. Attention was turned to the lateral side where he was known to have an anterior horn lateral meniscal tear and was hoping to leave this alone, but it really was a complex tear with obvious instability.  This was debrided.  As it was began to be debrided, there was obviously clear indication that the meniscus was diseased and damaged.  This was debrided back to a smooth and stable rim on the anterolateral side. Lateral femoral condyle had minimal degenerative change as well as a lateral tibial plateau.  Attention was turned to the patellofemoral joint with some grade 2 change which was debrided.  At this point, the knee was copiously and thoroughly lavaged and suctioned dry.  The arthroscopic portals were closed with bandage. Sterile compression dressing was applied.  The patient was taken to the recovery room where she was noted to be in satisfactory condition. Estimated blood loss for the procedure was none.     Harvie Junior,  M.D.     JLG/MEDQ  D:  09/01/2011  T:  09/01/2011  Job:  [id number]

## 2011-09-01 NOTE — Anesthesia Procedure Notes (Addendum)
Procedure Name: LMA Insertion Date/Time: 09/01/2011 8:41 AM Performed by: Meyer Russel Pre-anesthesia Checklist: Patient identified, Emergency Drugs available, Suction available, Patient being monitored and Timeout performed Patient Re-evaluated:Patient Re-evaluated prior to inductionOxygen Delivery Method: Circle System Utilized Preoxygenation: Pre-oxygenation with 100% oxygen Intubation Type: IV induction Ventilation: Mask ventilation without difficulty LMA: LMA inserted LMA Size: 5.0 Number of attempts: 1 Airway Equipment and Method: bite block Placement Confirmation: positive ETCO2 and breath sounds checked- equal and bilateral Tube secured with: Tape Dental Injury: Teeth and Oropharynx as per pre-operative assessment

## 2011-09-01 NOTE — Brief Op Note (Signed)
09/01/2011  9:43 AM  PATIENT:  Daniel Lam  50 y.o. male  PRE-OPERATIVE DIAGNOSIS:  medial meniscus tear left knee  POST-OPERATIVE DIAGNOSIS:  medial meniscus tear left knee  PROCEDURE:  Procedure(s): ARTHROSCOPY KNEE  SURGEON:  Surgeon(s): Harvie Junior  PHYSICIAN ASSISTANT:   ASSISTANTS: bethune   ANESTHESIA:   general  EBL:  Total I/O In: 1600 [I.V.:1600] Out: -   BLOOD ADMINISTERED:none  DRAINS: none   LOCAL MEDICATIONS USED:  MARCAINE 30 CC  SPECIMEN:  No Specimen  DISPOSITION OF SPECIMEN:  N/A  COUNTS:  YES  TOURNIQUET:  * No tourniquets in log *  DICTATION: .Other Dictation: Dictation Number [id number]  PLAN OF CARE: Discharge to home after PACU  PATIENT DISPOSITION:  PACU - hemodynamically stable.

## 2011-09-01 NOTE — Anesthesia Preprocedure Evaluation (Addendum)
Anesthesia Evaluation  Patient identified by MRN, date of birth, ID band Patient awake    Reviewed: Allergy & Precautions, H&P , NPO status , Patient's Chart, lab work & pertinent test results, reviewed documented beta blocker date and time   History of Anesthesia Complications (+) Family history of anesthesia reaction  Airway Mallampati: II TM Distance: >3 FB Neck ROM: full    Dental   Pulmonary asthma ,    Pulmonary exam normal       Cardiovascular neg cardio ROS     Neuro/Psych Negative Neurological ROS  Negative Psych ROS   GI/Hepatic negative GI ROS, Neg liver ROS,   Endo/Other  Negative Endocrine ROS  Renal/GU negative Renal ROS  Genitourinary negative   Musculoskeletal   Abdominal   Peds  Hematology negative hematology ROS (+)   Anesthesia Other Findings See surgeon's H&P   Reproductive/Obstetrics negative OB ROS                           Anesthesia Physical Anesthesia Plan  ASA: II  Anesthesia Plan: General   Post-op Pain Management:    Induction:   Airway Management Planned: LMA  Additional Equipment:   Intra-op Plan:   Post-operative Plan: Extubation in OR  Informed Consent: I have reviewed the patients History and Physical, chart, labs and discussed the procedure including the risks, benefits and alternatives for the proposed anesthesia with the patient or authorized representative who has indicated his/her understanding and acceptance.   Dental Advisory Given  Plan Discussed with: CRNA and Surgeon  Anesthesia Plan Comments:       Anesthesia Quick Evaluation

## 2011-09-01 NOTE — Interval H&P Note (Signed)
History and Physical Interval Note:   09/01/2011   8:22 AM   Daniel Lam  has presented today for surgery, with the diagnosis of mmt lt knee  The various methods of treatment have been discussed with the patient and family. After consideration of risks, benefits and other options for treatment, the patient has consented to  Procedure(s): ARTHROSCOPY KNEE as a surgical intervention .  The patients' history has been reviewed, patient examined, no change in status, stable for surgery.  I have reviewed the patients' chart and labs.  Questions were answered to the patient's satisfaction.     Harvie Junior  MD

## 2011-09-10 ENCOUNTER — Encounter (HOSPITAL_BASED_OUTPATIENT_CLINIC_OR_DEPARTMENT_OTHER): Payer: Self-pay | Admitting: Orthopedic Surgery

## 2011-09-23 ENCOUNTER — Telehealth: Payer: Self-pay | Admitting: Pulmonary Disease

## 2011-09-23 NOTE — Telephone Encounter (Signed)
Called and spoke with pt. Offered him other appt dates and time.  Pt stated he would just keep this appt for next Thursday at 11am . Nothing further needed.

## 2011-09-30 ENCOUNTER — Encounter: Payer: Self-pay | Admitting: Pulmonary Disease

## 2011-09-30 ENCOUNTER — Ambulatory Visit (INDEPENDENT_AMBULATORY_CARE_PROVIDER_SITE_OTHER): Payer: 59 | Admitting: Pulmonary Disease

## 2011-09-30 VITALS — BP 110/72 | HR 91 | Temp 97.1°F | Ht 70.0 in | Wt 228.2 lb

## 2011-09-30 DIAGNOSIS — G4733 Obstructive sleep apnea (adult) (pediatric): Secondary | ICD-10-CM | POA: Insufficient documentation

## 2011-09-30 NOTE — Patient Instructions (Signed)
Will start on cpap at moderate level.  Please call if having tolerance issues. Work on weight reduction  followup with me in 6weeks.

## 2011-09-30 NOTE — Assessment & Plan Note (Signed)
The patient has been unable to lose sufficient weight, and has continued to have significant symptoms related to his moderate sleep apnea.  I have recommended more aggressive treatment such as dental appliance or CPAP.  The patient wishes to try CPAP, and will start him on a moderate pressure level to allow for tolerance.  If he does well with this, we can optimize his pressure for him at a later date.

## 2011-09-30 NOTE — Progress Notes (Signed)
  Subjective:    Patient ID: Daniel Lam, male    DOB: 07-22-1961, 50 y.o.   MRN: [id number]  HPI Patient comes in today for follow up of his known moderate sleep apnea.  He had initially tried to work on weight loss alone, but has not been successful since last visit.  He has continued to have loud snoring, nonrestorative sleep, and daytime sleepiness in the late afternoons and early evenings.   Review of Systems  Constitutional: Negative for fever and unexpected weight change.  HENT: Negative for ear pain, nosebleeds, congestion, sore throat, rhinorrhea, sneezing, trouble swallowing, dental problem, postnasal drip and sinus pressure.   Eyes: Negative for redness and itching.  Respiratory: Negative for cough, chest tightness, shortness of breath and wheezing.   Cardiovascular: Negative for palpitations and leg swelling.  Gastrointestinal: Negative for nausea and vomiting.  Genitourinary: Negative for dysuria.  Musculoskeletal: Negative for joint swelling.  Skin: Negative for rash.  Neurological: Negative for headaches.  Hematological: Does not bruise/bleed easily.  Psychiatric/Behavioral: Negative for dysphoric mood. The patient is not nervous/anxious.        Objective:   Physical Exam Well-developed male in no acute distress No significant lower extremity edema or cyanosis Alert, does not appear overly sleepy, moves all 4 extremities       Assessment & Plan:

## 2011-10-06 ENCOUNTER — Encounter: Payer: Self-pay | Admitting: Pulmonary Disease

## 2011-10-13 ENCOUNTER — Other Ambulatory Visit: Payer: Self-pay | Admitting: Internal Medicine

## 2011-10-13 DIAGNOSIS — R401 Stupor: Secondary | ICD-10-CM

## 2011-10-14 ENCOUNTER — Ambulatory Visit (HOSPITAL_BASED_OUTPATIENT_CLINIC_OR_DEPARTMENT_OTHER)
Admission: RE | Admit: 2011-10-14 | Discharge: 2011-10-14 | Disposition: A | Discharge status: Home / Self Care | Payer: 59 | Source: Ambulatory Visit | Attending: Internal Medicine | Admitting: Internal Medicine

## 2011-10-14 DIAGNOSIS — R404 Transient alteration of awareness: Secondary | ICD-10-CM | POA: Insufficient documentation

## 2011-10-14 DIAGNOSIS — R401 Stupor: Secondary | ICD-10-CM

## 2011-10-14 DIAGNOSIS — C6290 Malignant neoplasm of unspecified testis, unspecified whether descended or undescended: Secondary | ICD-10-CM | POA: Insufficient documentation

## 2011-11-11 ENCOUNTER — Encounter: Payer: Self-pay | Admitting: Pulmonary Disease

## 2011-11-11 ENCOUNTER — Ambulatory Visit (INDEPENDENT_AMBULATORY_CARE_PROVIDER_SITE_OTHER): Payer: 59 | Admitting: Pulmonary Disease

## 2011-11-11 VITALS — BP 114/76 | HR 64 | Temp 98.0°F | Ht 70.0 in | Wt 231.4 lb

## 2011-11-11 DIAGNOSIS — G4733 Obstructive sleep apnea (adult) (pediatric): Secondary | ICD-10-CM

## 2011-11-11 NOTE — Assessment & Plan Note (Signed)
The patient is wearing CPAP compliantly, and feels that he is sleeping a little better with increased daytime energy and alertness.  He is having a few mask issues that are fairly minor, and is working through this on his own.  He is having the suggestion of eustachian tube dysfunction with some mild dizziness.  I have asked him to try saline rinses and short-term Sudafed.  We have yet to optimize his CPAP pressure, and we'll use the automatic setting to do this over the next few weeks.  I have also encouraged him to work aggressively on weight loss.

## 2011-11-11 NOTE — Patient Instructions (Signed)
Can try sudafed, nasal saline rinses, and turning up heat on humidifier to help with eustachian tube dysfunction Will get pressure optimized for you on autoset.  Let us know if you think this is more comfortable. Work on weight loss followup with me in 6mos, but call if having issues with tolerance or have questions.

## 2011-11-11 NOTE — Progress Notes (Signed)
  Subjective:    Patient ID: Daniel Lam, male    DOB: 11/20/1960, 51 y.o.   MRN: [id number]  HPI Patient comes in today for followup of his obstructive sleep apnea.  He has been wearing CPAP compliantly, and does see some improvement in his sleep and daytime alertness.  He is having some minor mask issues, and also what sounds like eustachian tube dysfunction.  He has no issues with pressure tolerance.   Review of Systems  Constitutional: Negative for fever and unexpected weight change.  HENT: Negative for ear pain, nosebleeds, congestion, sore throat, rhinorrhea, sneezing, trouble swallowing, dental problem, postnasal drip and sinus pressure.   Eyes: Negative for redness and itching.  Respiratory: Negative for cough, chest tightness, shortness of breath and wheezing.   Cardiovascular: Negative for palpitations and leg swelling.  Gastrointestinal: Negative for nausea and vomiting.  Genitourinary: Negative for dysuria.  Musculoskeletal: Negative for joint swelling.  Skin: Negative for rash.  Neurological: Negative for headaches.  Hematological: Does not bruise/bleed easily.  Psychiatric/Behavioral: Negative for dysphoric mood. The patient is not nervous/anxious.        Objective:   Physical Exam Overweight male in no acute distress No skin breakdown or pressure necrosis from the CPAP mask  alert, does not appear to be sleepy, moves all 4 extremities.       Assessment & Plan:

## 2011-12-02 ENCOUNTER — Other Ambulatory Visit: Payer: Self-pay | Admitting: Pulmonary Disease

## 2011-12-02 DIAGNOSIS — G4733 Obstructive sleep apnea (adult) (pediatric): Secondary | ICD-10-CM

## 2011-12-03 ENCOUNTER — Other Ambulatory Visit: Payer: Self-pay | Admitting: Pulmonary Disease

## 2011-12-03 DIAGNOSIS — G4733 Obstructive sleep apnea (adult) (pediatric): Secondary | ICD-10-CM

## 2011-12-05 ENCOUNTER — Other Ambulatory Visit: Payer: Self-pay | Admitting: Pulmonary Disease

## 2011-12-05 DIAGNOSIS — G4733 Obstructive sleep apnea (adult) (pediatric): Secondary | ICD-10-CM

## 2011-12-09 ENCOUNTER — Telehealth: Payer: Self-pay | Admitting: Pulmonary Disease

## 2011-12-09 NOTE — Telephone Encounter (Signed)
Order was sent for this to [facility] on 12/05/11- nothing done with it yet Per Almyra Free she will go ahead and fax order to [facility] now.  I spoke with pt and notified him of this and he verbalized understanding. Would like this taken care of today, as he states has not been able to use his machine and has not slept in 5 days.  Called and spoke with [place] and notified her that someone needs to go fix this asap. She verbalized understanding and states will have pressure changed today.

## 2011-12-23 ENCOUNTER — Encounter: Payer: Self-pay | Admitting: Pulmonary Disease

## 2012-03-30 ENCOUNTER — Ambulatory Visit (INDEPENDENT_AMBULATORY_CARE_PROVIDER_SITE_OTHER)
Admission: RE | Admit: 2012-03-30 | Discharge: 2012-03-30 | Disposition: A | Discharge status: Home / Self Care | Payer: 59 | Source: Ambulatory Visit | Attending: Internal Medicine | Admitting: Internal Medicine

## 2012-03-30 ENCOUNTER — Other Ambulatory Visit: Payer: Self-pay | Admitting: *Deleted

## 2012-03-30 DIAGNOSIS — C6290 Malignant neoplasm of unspecified testis, unspecified whether descended or undescended: Secondary | ICD-10-CM

## 2012-03-30 MED ORDER — IOHEXOL 300 MG/ML  SOLN
100.0000 mL | Freq: Once | INTRAMUSCULAR | Status: AC | PRN
  Administered 2012-03-30: 100 mL via INTRAVENOUS

## 2012-05-08 ENCOUNTER — Other Ambulatory Visit: Payer: Self-pay | Admitting: *Deleted

## 2012-05-08 DIAGNOSIS — C6290 Malignant neoplasm of unspecified testis, unspecified whether descended or undescended: Secondary | ICD-10-CM

## 2012-05-08 MED ORDER — ALBUTEROL SULFATE HFA 108 (90 BASE) MCG/ACT IN AERS: 2.0000 | INHALATION_SPRAY | Freq: Four times a day (QID) | RESPIRATORY_TRACT | Status: DC

## 2012-05-09 ENCOUNTER — Other Ambulatory Visit: Payer: Self-pay | Admitting: Pulmonary Disease

## 2012-05-09 DIAGNOSIS — G4733 Obstructive sleep apnea (adult) (pediatric): Secondary | ICD-10-CM

## 2012-05-11 ENCOUNTER — Ambulatory Visit (INDEPENDENT_AMBULATORY_CARE_PROVIDER_SITE_OTHER)
Admission: RE | Admit: 2012-05-11 | Discharge: 2012-05-11 | Disposition: A | Discharge status: Home / Self Care | Payer: 59 | Source: Ambulatory Visit | Attending: Internal Medicine | Admitting: Internal Medicine

## 2012-05-11 DIAGNOSIS — C6290 Malignant neoplasm of unspecified testis, unspecified whether descended or undescended: Secondary | ICD-10-CM

## 2012-09-14 ENCOUNTER — Other Ambulatory Visit: Payer: Self-pay | Admitting: Internal Medicine

## 2012-09-14 ENCOUNTER — Telehealth: Payer: Self-pay

## 2012-09-14 MED ORDER — PEG-KCL-NACL-NASULF-NA ASC-C 100 G PO SOLR: 1.0000 | Freq: Once | ORAL | Status: DC

## 2012-09-14 NOTE — Telephone Encounter (Signed)
Pt instructed on EGD prep and paperwork signed.

## 2012-09-14 NOTE — Telephone Encounter (Signed)
Message copied by Chrystie Nose on Thu Sep 14, 2012  3:12 PM ------      Message from: [place], [place] R      Created: Tue Aug 22, 2012  4:58 PM      Regarding: Pre-visit       Pt to come in at 3pm for previsit for EGD

## 2012-09-18 ENCOUNTER — Encounter: Payer: Self-pay | Admitting: Internal Medicine

## 2012-09-18 ENCOUNTER — Ambulatory Visit (AMBULATORY_SURGERY_CENTER): Payer: 59 | Admitting: Internal Medicine

## 2012-09-18 VITALS — BP 125/85 | HR 67 | Temp 97.5°F | Resp 16 | Ht 68.0 in | Wt 210.0 lb

## 2012-09-18 DIAGNOSIS — R1310 Dysphagia, unspecified: Secondary | ICD-10-CM

## 2012-09-18 DIAGNOSIS — K219 Gastro-esophageal reflux disease without esophagitis: Secondary | ICD-10-CM

## 2012-09-18 MED ORDER — SODIUM CHLORIDE 0.9 % IV SOLN: 500.0000 mL | INTRAVENOUS | Status: DC

## 2012-09-18 NOTE — Patient Instructions (Addendum)
A handout was given to your care partner on GERD, anti-reflux regimen.  Per Dr. Marina Goodell continue your acid reducing medication , dexilant.  You may also resume your current medications today as well.  A biopsy was take to check for h. Pylori.  Please call if any questions or concerns.    YOU HAD AN ENDOSCOPIC PROCEDURE TODAY AT THE [facility]: Refer to the procedure report that was given to you for any specific questions about what was found during the examination.  If the procedure report does not answer your questions, please call your gastroenterologist to clarify.  If you requested that your care partner not be given the details of your procedure findings, then the procedure report has been included in a sealed envelope for you to review at your convenience later.  YOU SHOULD EXPECT: Some feelings of bloating in the abdomen. Passage of more gas than usual.  Walking can help get rid of the air that was put into your GI tract during the procedure and reduce the bloating. If you had a lower endoscopy (such as a colonoscopy or flexible sigmoidoscopy) you may notice spotting of blood in your stool or on the toilet paper. If you underwent a bowel prep for your procedure, then you may not have a normal bowel movement for a few days.  DIET: Your first meal following the procedure should be a light meal and then it is ok to progress to your normal diet.  A half-sandwich or bowl of soup is an example of a good first meal.  Heavy or fried foods are harder to digest and may make you feel nauseous or bloated.  Likewise meals heavy in dairy and vegetables can cause extra gas to form and this can also increase the bloating.  Drink plenty of fluids but you should avoid alcoholic beverages for 24 hours.  ACTIVITY: Your care partner should take you home directly after the procedure.  You should plan to take it easy, moving slowly for the rest of the day.  You can resume normal activity the day after the  procedure however you should NOT DRIVE or use heavy machinery for 24 hours (because of the sedation medicines used during the test).    SYMPTOMS TO REPORT IMMEDIATELY: A gastroenterologist can be reached at any hour.  During normal business hours, 8:30 AM to 5:00 PM Monday through Friday, call [phone].  After hours and on weekends, please call the GI answering service at [phone] who will take a message and have the physician on call contact you.     Following upper endoscopy (EGD)  Vomiting of blood or coffee ground material  New chest pain or pain under the shoulder blades  Painful or persistently difficult swallowing  New shortness of breath  Fever of 100F or higher  Black, tarry-looking stools  FOLLOW UP: If any biopsies were taken you will be contacted by phone or by letter within the next 1-3 weeks.  Call your gastroenterologist if you have not heard about the biopsies in 3 weeks.  Our staff will call the home number listed on your records the next business day following your procedure to check on you and address any questions or concerns that you may have at that time regarding the information given to you following your procedure. This is a courtesy call and so if there is no answer at the home number and we have not heard from you through the emergency physician on call, we will  assume that you have returned to your regular daily activities without incident.  SIGNATURES/CONFIDENTIALITY: You and/or your care partner have signed paperwork which will be entered into your electronic medical record.  These signatures attest to the fact that that the information above on your After Visit Summary has been reviewed and is understood.  Full responsibility of the confidentiality of this discharge information lies with you and/or your care-partner.

## 2012-09-18 NOTE — Progress Notes (Signed)
No complaints noted in the recovery room. Maw  Patient did not experience any of the following events: a burn prior to discharge; a fall within the facility; wrong site/side/patient/procedure/implant event; or a hospital transfer or hospital admission upon discharge from the facility. (G8907) Patient did not have preoperative order for IV antibiotic SSI prophylaxis. (G8918)  

## 2012-09-18 NOTE — Op Note (Signed)
[facility] [address] [place], [address]   ENDOSCOPY PROCEDURE REPORT  PATIENT: Daniel, Lam  MR#: [id number] BIRTHDATE: [birth year] , 51  yrs. old GENDER: Male ENDOSCOPIST: Roxy Cedar, MD REFERRED BY:  .  [employer] PROCEDURE DATE:  09/18/2012 PROCEDURE:  EGD w/ biopsy ASA CLASS:     Class II INDICATIONS:  History of esophageal reflux.   Dysphagia. MEDICATIONS: MAC sedation, administered by CRNA and propofol (Diprivan) 200mg  IV TOPICAL ANESTHETIC: Cetacaine Spray  DESCRIPTION OF PROCEDURE: After the risks benefits and alternatives of the procedure were thoroughly explained, informed consent was obtained.  The LB GIF-H180 [id number] endoscope was introduced through the mouth and advanced to the second portion of the duodenum. Without limitations.  The instrument was slowly withdrawn as the mucosa was fully examined.      The upper, middle and distal third of the esophagus were carefully inspected and no abnormalities were noted.  The z-line was well seen at the GEJ.No Barrett's or stricture.  The endoscope was pushed into the fundus which was normal including a retroflexed view.  The antrum, gastric body, first and second part of the duodenum were unremarkable.  Retroflexed views revealed no abnormalities. CLO bx taken.     The scope was then withdrawn from the patient and the procedure completed.  COMPLICATIONS: There were no complications. ENDOSCOPIC IMPRESSION: 1. Normal EGD 2. GERD  RECOMMENDATIONS: 1.  Anti-reflux regimen to be followed 2.  Continue PPI 3.  Follow up H. pylori testing  REPEAT EXAM:  eSigned:  Roxy Cedar, MD 09/18/2012 4:51 PM   MW:UXLK Drue Novel, MD and Lelon Perla, DO

## 2012-09-19 ENCOUNTER — Telehealth: Payer: Self-pay

## 2012-09-19 NOTE — Telephone Encounter (Signed)
  Follow up Call-  Call back number 09/18/2012  Post procedure Call Back phone  # [phone]  Permission to leave phone message Yes     Patient questions:  Do you have a fever, pain , or abdominal swelling? no Pain Score  0 *  Have you tolerated food without any problems? yes  Have you been able to return to your normal activities? yes  Do you have any questions about your discharge instructions: Diet   no Medications  no Follow up visit  no  Do you have questions or concerns about your Care? no  Actions: * If pain score is 4 or above: No action needed, pain <4.

## 2012-09-21 ENCOUNTER — Encounter: Payer: 59 | Admitting: Internal Medicine

## 2012-10-20 ENCOUNTER — Other Ambulatory Visit: Payer: Self-pay | Admitting: Family Medicine

## 2012-10-20 DIAGNOSIS — J40 Bronchitis, not specified as acute or chronic: Secondary | ICD-10-CM

## 2012-10-20 MED ORDER — AZITHROMYCIN 250 MG PO TABS: ORAL_TABLET | ORAL | Status: DC

## 2012-11-28 ENCOUNTER — Ambulatory Visit (HOSPITAL_BASED_OUTPATIENT_CLINIC_OR_DEPARTMENT_OTHER)
Admission: RE | Admit: 2012-11-28 | Discharge: 2012-11-28 | Disposition: A | Discharge status: Home / Self Care | Payer: 59 | Source: Ambulatory Visit | Attending: Internal Medicine | Admitting: Internal Medicine

## 2012-11-28 ENCOUNTER — Other Ambulatory Visit: Payer: Self-pay | Admitting: Internal Medicine

## 2012-11-28 DIAGNOSIS — C6290 Malignant neoplasm of unspecified testis, unspecified whether descended or undescended: Secondary | ICD-10-CM | POA: Insufficient documentation

## 2013-01-12 ENCOUNTER — Other Ambulatory Visit: Payer: Self-pay

## 2013-01-12 MED ORDER — VALACYCLOVIR HCL 1 G PO TABS: 1000.0000 mg | ORAL_TABLET | Freq: Every day | ORAL | Status: DC

## 2013-01-12 NOTE — Telephone Encounter (Signed)
RX sent electronically 

## 2013-01-20 ENCOUNTER — Encounter: Payer: BC Managed Care – PPO | Admitting: Physician Assistant

## 2013-03-13 ENCOUNTER — Encounter: Payer: Self-pay | Admitting: Family Medicine

## 2013-04-11 NOTE — Progress Notes (Signed)
This encounter was created in error - please disregard.

## 2013-05-10 ENCOUNTER — Other Ambulatory Visit: Payer: Self-pay | Admitting: Family Medicine

## 2013-05-10 ENCOUNTER — Ambulatory Visit (INDEPENDENT_AMBULATORY_CARE_PROVIDER_SITE_OTHER)
Admission: RE | Admit: 2013-05-10 | Discharge: 2013-05-10 | Disposition: A | Discharge status: Home / Self Care | Payer: 59 | Source: Ambulatory Visit | Attending: Family Medicine | Admitting: Family Medicine

## 2013-05-10 DIAGNOSIS — M7742 Metatarsalgia, left foot: Secondary | ICD-10-CM

## 2013-05-10 DIAGNOSIS — M7750 Other enthesopathy of unspecified foot: Secondary | ICD-10-CM

## 2013-06-12 ENCOUNTER — Other Ambulatory Visit: Payer: Self-pay | Admitting: Family Medicine

## 2013-06-12 MED ORDER — OFLOXACIN 0.3 % OT SOLN: 10.0000 [drp] | Freq: Every day | OTIC | Status: DC

## 2013-06-12 MED ORDER — CEFUROXIME AXETIL 500 MG PO TABS: 500.0000 mg | ORAL_TABLET | Freq: Two times a day (BID) | ORAL | Status: AC

## 2013-07-01 ENCOUNTER — Other Ambulatory Visit: Payer: Self-pay | Admitting: Family Medicine

## 2013-07-01 DIAGNOSIS — R7989 Other specified abnormal findings of blood chemistry: Secondary | ICD-10-CM

## 2013-07-26 ENCOUNTER — Ambulatory Visit (INDEPENDENT_AMBULATORY_CARE_PROVIDER_SITE_OTHER): Payer: 59 | Admitting: Internal Medicine

## 2013-07-26 ENCOUNTER — Encounter: Payer: Self-pay | Admitting: Internal Medicine

## 2013-07-26 VITALS — BP 114/72 | HR 71 | Temp 98.4°F | Resp 12 | Ht 70.0 in | Wt 227.0 lb

## 2013-07-26 DIAGNOSIS — R5383 Other fatigue: Secondary | ICD-10-CM

## 2013-07-26 DIAGNOSIS — R5381 Other malaise: Secondary | ICD-10-CM

## 2013-07-26 DIAGNOSIS — E291 Testicular hypofunction: Secondary | ICD-10-CM

## 2013-07-26 NOTE — Patient Instructions (Addendum)
If labs are abnormal, please return in 2 months. Please have labs drawn at 8 am, fasting.

## 2013-07-26 NOTE — Progress Notes (Signed)
Patient ID: Daniel Lam, male   DOB: 1961/03/31, 52 y.o.   MRN: [id number]  HPI: Daniel Lam is a 52 y.o.-year-old man, self-referred, for management of hypogonadism.   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 No recurrence. Followed by Dr Isabel Caprice with Urology.  He was subsequently started on testosterone gel (Androgel, 4 pumps), under Dr. Willeen Cass supervision. Due to elevated LFTs, he stopped testosterone with the subsequent slight improvement in his LFTs. Of note, he also has obstructive sleep apnea, moderate, on CPAP - good compliance.  Patient would like to discuss restarting testosterone with close supervision of his LFTs. Of note, he has seen gastroenterology in the past, Dr. Marina Goodell. I reviewed his note from 03/31/2011. Per his notes, patient had mild transaminitis ~2002 years ago. At that time, patient reports that an U/S liver: fatty liver, RPR negative, viral studies were negative, copper and iron disorders were ruled out. There was no excessive alcohol use or family history of liver disease. A CT scan from 2011 was reviewed by Dr. Marina Goodell, and showed a normal liver. It was believed that his mild transaminitis could be from fatty liver disease.   Also has a h/o high prolactin in 08/25/2009: 30.2, ,then 12.4, and on 02/02/2011, 4.6.  His main concerns are fatigue, decreased libido, and lack of stamina - can bike for 1 h but very tired afterwards, also very tired at the end of the day. No difficulty obtaining or maintaining an erection.    No breast discomfort/gynecomastia No height loss - also had a normal DEXA in 2011 No hot flushes No vision problems No severe HAs - except: Severe acute headache 2004 (MRI and MRA of the brain normal) No personal h/o infertility - has 3 children No FH of hypogonadism/infertility  No FH of hemochromatosis or pituitary tumors No excessive weight gain or loss.  No chronic diseases No  chronic pain.  Not on opiates, does not take steroids.  No more than social alcohol use.  No anabolic steroids use No herbal medicines On antidepressants: Prozac - for 6 years, previously Paxil, also on anxiolytic: Xanax Has AI ds in his family, no FH of MS. He does not have family history of early cardiac disease.   ROS: Constitutional: + weight gain and loss, + fatigue, no subjective hyperthermia/hypothermia Eyes: no blurry vision, no xerophthalmia ENT: no sore throat, no nodules palpated in throat, no dysphagia/odynophagia, no hoarseness Cardiovascular: no CP/SOB/palpitations/leg swelling Respiratory: + cough/+SOB/+wheezing Gastrointestinal: no N/V/D/+C Musculoskeletal: no muscle/+ joint aches - from exercise (back pain) Skin: no rashes Neurological: no tremors/numbness/tingling/dizziness Psychiatric: no depression/anxiety Low libido  PMH:  Anxiety  Mild asthma  Atypical mole (~ 2008)  Severe acute headache 2004 ( MRI and MRA of the brain normal)  HSV-2, 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 Lasik  1999  . Appendectomy  2000  . Orchiectomy      Lt. 08/2009  . Knee arthroscopy  09/01/2011    Procedure: ARTHROSCOPY KNEE;  Surgeon: Harvie Junior;  Location: [facility];  Service: Orthopedics;  Laterality: Left;  medial and lateral meniscus tear debridment  and chondroplasty   History   Social History  . Marital Status: Married    Spouse Name: N/A    Number of Children: 3: 18-14-11 y/o   Occupational History  . physician  internal medicine   Social History Main Topics  . Smoking status: Former Smoker Smokes cigars  . Smokeless tobacco: Never Used  . Alcohol Use: Yes - 2 beers - weekends  . Drug Use: No  . Sexual Activity: Yes    Partners: Female   Social History Narrative   Regular  exercise: yes, very active   Caffeine use: 2 cups of coffee and soda's daily   Current Outpatient Prescriptions on File Prior to Visit  Medication Sig Dispense Refill  . ALPRAZolam (XANAX) 0.5 MG tablet Take 0.5 mg by mouth at bedtime as needed.        Marland Kitchen azithromycin (ZITHROMAX Z-PAK) 250 MG tablet As directed  6 each  0  . celecoxib (CELEBREX) 100 MG capsule Take 100 mg by mouth as needed.        Marland Kitchen FLUoxetine (PROZAC) 20 MG capsule Take 20 mg by mouth daily.        . valACYclovir (VALTREX) 1000 MG tablet Take 1 tablet (1,000 mg total) by mouth daily.  90 tablet  2  . albuterol (PROAIR HFA) 108 (90 BASE) MCG/ACT inhaler Inhale 2 puffs into the lungs every 4 (four) hours as needed.        Marland Kitchen albuterol (PROVENTIL HFA;VENTOLIN HFA) 108 (90 BASE) MCG/ACT inhaler Inhale 2 puffs into the lungs 4 (four) times daily.  1 Inhaler  3  . Dexlansoprazole (DEXILANT PO) Take by mouth.      Marland Kitchen ofloxacin (FLOXIN) 0.3 % otic solution Place 10 drops into the left ear daily.  5 mL  0   No current facility-administered medications on file prior to visit.   Allergies  Allergen Reactions  . Demerol [Meperidine Hcl]     Difficulty voiding  . Latex     REACTION: SOB, rash, itching  . Morphine And Related     Difficulty voiding   Family History  Problem Relation Age of Onset  . Colon cancer Neg Hx   . Sjogren's syndrome Mother   . Colon polyps Father   . Dementia Father   . Throat cancer Father   . Throat cancer      Larynx  . Melanoma Brother    PE: BP 114/72  Pulse 71  Temp(Src) 98.4 F (36.9 C) (Oral)  Resp 12  Ht 5\' 10"  (1.778 m)  Wt 227 lb (102.967 kg)  BMI 32.57 kg/m2  SpO2 96% Wt Readings from Last 3 Encounters:  07/26/13 227 lb (102.967 kg)  09/18/12 210 lb (95.255 kg)  11/11/11 231 lb 6.4 oz (104.962 kg)   Constitutional: slightly overweight, in NAD Eyes: PERRLA, EOMI, no exophthalmos ENT: moist mucous membranes, no thyromegaly, no cervical lymphadenopathy Cardiovascular: RRR, No  MRG Respiratory: CTA B Gastrointestinal: abdomen soft, NT, ND, BS+ Musculoskeletal: no deformities, strength intact in all 4 Skin: moist, warm, no rashes Neurological: no tremor with outstretched hands, DTR normal in all 4 Genital exam: deferred  ASSESSMENT: 1. H/o hypogonadotropic hypogonadism Labs from 08/02/2002: - CMP normal, except: - AST 28, ALT 59 - iron: 111 (40-155), TIBC 301 (250-450), iron saturation 36% (15-55) - hepatitis C virus antibody, Hep B surface Ag, Hep B core Ab, total - all negative Labs prior to orchiectomy (08/19/2009): - prolactin 30.2 (2.1-17.7) - testosterone 196 ([phone]) - FSH 8.4, LH 2.4 - beta hCG <1 - LDH 262 (30-225) - BMP normal except glucose 117 - CBC with differential normal Labs 01/16/2010: - Prolactin 12.4 Labs 02/02/2011, on testosterone: - Testosterone 277 (LLN 350) - AST 70,  ALT 105 - prolactin 4.6 Labs 07/06/2011, off testosterone for 5 months: - testosterone 268  (LLN 350) and, LH 9.69 (1.5-9.3), FSH 20.7 (1.4-18.1) - AST 44, ALT 68 Labs 01/04/2013: - Lipid profile: 177/108/53/102 - CBC normal - CMP normal, including glucose 91.  - AST 26, ALT 40 - PSA 0.99 - TSH 1.722  2. History of testicular cancer - 2011 - Seminoma - Pathology:  Seminoma, 4.2 cm, limited left testis Angio-lymphatic invasion present Intratubular germ cell neoplasm present Resection margins clear - s/p left orchiectomy, 08/2009; s/p radiation therapy 09/2009 - Dr. Isabel Caprice, urology  PLAN:  1. Patient with history of testicular cancer, status post left orchiectomy, and with presumed testosterone deficiency secondary to the surgery. 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  At the same lab draw, patient also had a higher prolactin, and 30.2. There is the possibility of the patient having a low SHBG at that time, and his actual  free testosterone to have been normal. In that case, his LH and FSH were adequately normal. However, the degree of the deficiency might argue with this scenario. After the surgery, testosterone remained lower than the lower limit of normal, and his gonadotropins were slightly above the upper limit of normal. It is possible that he developed mild primary hypogonadism after his surgery/radiation therapy. We discussed about the fact that a unilateral orchiectomy does not usually cause primary hypogonadism.  - reviewing his liver function tests and previous investigations, the patient appears to have fatty liver disease, and it is possible that testosterone could have contributed to the elevation in his transaminases, although this is not a common side effect of testosterone. This is mostly seen with 17 alpha alkylated testosterone analogues (which are taken po), and have a first passage effect on liver. Sometimes this is also seen with abuse of anabolic steroids, and very high concentration of serum testosterone. Reviewing the literature, regular testosterone replacement doses appear to be quite safe from this point of view. Therefore, I agree to retry supplementing testosterone, if it is low, with close supervision of the liver function tests. - we will start by checking a testosterone level, including the free testosterone. We did discuss about the fact that if the testosterone is low, we can either start testosterone or try clomiphene, which is a central SERM, blocking the estrogen receptors at the site of the pituitary and hypothalamus.  I will add an estradiol level not to check for testicular cancer, but to see if this is on the high side, as in this case patient might benefit from clomiphene.  - This patient had an indication of secondary hypogonadism in the past, and also a higher prolactin, I would like to check his pituitary hormones along with the above labs. In case we need to start testosterone, I added  a PSA and a CBC. I did discuss with the patient that if we are to start testosterone, he will need to get the rectal exam by his urologist. Also, we discussed that if we start testosterone, we would start at a low dose and advance up slowly.  - since he complains of fatigue, I will also add a hemoglobin A1c, vitamin B12 (especially with his family history of autoimmune disorders) and also vitamin D. - patient agrees with the plan and he will return to have labs at 8 AM, fasting  Component     Latest Ref  Rng 07/27/2013  Testosterone     300 - 890 ng/dL 161  Sex Hormone Binding     13 - 71 nmol/L 34  Testosterone Free     47.0 - 244.0 pg/mL 65.0  Testosterone-% Freee.     1.6 - 2.9 % 2.0  Estradiol, Free      0.45  Estradiol      19  Results received      08/03/13  LH     1.50 - 9.30 mIU/mL 6.47  FSH     1.4 - 18.1 mIU/ML 15.1  Cortisol, Plasma      9.6  TSH     0.35 - 5.50 uIU/mL 1.11  T3, Free     2.3 - 4.2 pg/mL 2.6  Free T4     0.60 - 1.60 ng/dL 0.96  PSA     0.45 - 4.09 ng/mL 0.67  Prolactin     2.1 - 17.1 ng/mL 6.4  Somatomedin (IGF-I)     55 - 213 ng/mL 99  Growth Hormone     0.00 - 3.00 ng/mL <0.10  C206 ACTH     10 - 46 pg/mL 17   Component     Latest Ref Rng 07/27/2013 07/27/2013         8:09 AM  8:32 AM  Sodium     135 - 145 mEq/L 140 139  Potassium     3.5 - 5.3 mEq/L 3.8 4.0  Chloride     96 - 112 mEq/L 104 104  CO2     19 - 32 mEq/L 29 27  Glucose     70 - 99 mg/dL 811 (H) 914 (H)  BUN     6 - 23 mg/dL 17 17  Creatinine     7.82 - 1.35 mg/dL 1.1 9.56  Total Bilirubin     0.3 - 1.2 mg/dL 0.6 0.4  Alkaline Phosphatase     39 - 117 U/L 64 69  AST     0 - 37 U/L 40 (H) 35  ALT     0 - 53 U/L 54 (H) 51  Total Protein     6.0 - 8.3 g/dL 7.0 6.4  Albumin     3.5 - 5.2 g/dL 4.2 4.2  Calcium     8.4 - 10.5 mg/dL 9.0 8.7  GFR, Est African American       >89  GFR, Est Non African American       86  GFR     >60.00 mL/min 76.77   WBC      4.5 - 10.5 K/uL 4.5   RBC     4.22 - 5.81 Mil/uL 4.41   Platelets     150.0 - 400.0 K/uL 204.0   Hemoglobin     13.0 - 17.0 g/dL 21.3   HCT     08.6 - 57.8 % 41.7   MCV     78.0 - 100.0 fl 94.7   MCHC     30.0 - 36.0 g/dL 46.9   RDW     62.9 - 52.8 % 12.8   Hemoglobin A1C     4.6 - 6.5 % 5.7   Vitamin B-12     211 - 911 pg/mL 292   Vit D, 25-Hydroxy     30 - 89 ng/mL  22 (L)   Pituitary workup, including LH, FSH, testosterone level, are normal. Growth hormone is low, however the IGF-I is normal, therefore, no isolated GH deficiency. No elevated  prolactin. Estradiol normal. Thyroid tests normal. I discussed with the patient about the fact that his vitamin D and B12 are low, and will need supplementation. He is to discuss with his PCP about the best form of supplementation. I am not sure why he had to [facility] sets reported. It does look like blood was drawn twice... Mild transaminitis in the the blood sample drawn first. This is consistent with his previous history of transaminitis, and it is not new. CBC normal, no anemia, despite low B12 vitamin.

## 2013-07-27 ENCOUNTER — Other Ambulatory Visit: Payer: 59

## 2013-07-27 ENCOUNTER — Other Ambulatory Visit: Payer: Self-pay | Admitting: Family Medicine

## 2013-07-27 DIAGNOSIS — M549 Dorsalgia, unspecified: Secondary | ICD-10-CM

## 2013-07-27 DIAGNOSIS — R5381 Other malaise: Secondary | ICD-10-CM

## 2013-07-27 DIAGNOSIS — E291 Testicular hypofunction: Secondary | ICD-10-CM

## 2013-07-27 LAB — CORTISOL: Cortisol, Plasma: 9.6 ug/dL

## 2013-07-27 LAB — COMPREHENSIVE METABOLIC PANEL
ALT: 54 U/L — ABNORMAL HIGH (ref 0–53)
AST: 40 U/L — ABNORMAL HIGH (ref 0–37)
Alkaline Phosphatase: 64 U/L (ref 39–117)
Chloride: 104 mEq/L (ref 96–112)
Creatinine, Ser: 1.1 mg/dL (ref 0.4–1.5)
Total Bilirubin: 0.6 mg/dL (ref 0.3–1.2)

## 2013-07-27 LAB — COMPLETE METABOLIC PANEL WITH GFR
ALT: 51 U/L (ref 0–53)
AST: 35 U/L (ref 0–37)
Albumin: 4.2 g/dL (ref 3.5–5.2)
BUN: 17 mg/dL (ref 6–23)
CO2: 27 mEq/L (ref 19–32)
Calcium: 8.7 mg/dL (ref 8.4–10.5)
Chloride: 104 mEq/L (ref 96–112)
Creat: 1.01 mg/dL (ref 0.50–1.35)
GFR, Est African American: >89 mL/min
Total Bilirubin: 0.4 mg/dL (ref 0.3–1.2)

## 2013-07-27 LAB — CBC
HCT: 41.7 % (ref 39.0–52.0)
Hemoglobin: 14.3 g/dL (ref 13.0–17.0)
MCHC: 34.4 g/dL (ref 30.0–36.0)
MCV: 94.7 fl (ref 78.0–100.0)
RDW: 12.8 % (ref 11.5–14.6)

## 2013-07-27 LAB — PSA: PSA: 0.67 ng/mL (ref 0.10–4.00)

## 2013-07-27 LAB — PROLACTIN: Prolactin: 6.4 ng/mL (ref 2.1–17.1)

## 2013-07-27 LAB — VITAMIN B12: Vitamin B-12: 292 pg/mL (ref 211–911)

## 2013-07-27 LAB — HEMOGLOBIN A1C: Hgb A1c MFr Bld: 5.7 % (ref 4.6–6.5)

## 2013-07-27 LAB — T4, FREE: Free T4: 0.84 ng/dL (ref 0.60–1.60)

## 2013-07-27 MED ORDER — PREDNISONE 10 MG PO TABS: ORAL_TABLET | ORAL | Status: DC

## 2013-07-28 LAB — VITAMIN D 25 HYDROXY (VIT D DEFICIENCY, FRACTURES): Vit D, 25-Hydroxy: 22 ng/mL — ABNORMAL LOW (ref 30–89)

## 2013-07-30 LAB — GROWTH HORMONE: Growth Hormone: <0.1 ng/mL (ref 0.00–3.00)

## 2013-07-30 LAB — INSULIN-LIKE GROWTH FACTOR: Somatomedin (IGF-I): 99 ng/mL (ref 55–213)

## 2013-08-03 LAB — ESTRADIOL, FREE

## 2013-08-04 ENCOUNTER — Encounter: Payer: Self-pay | Admitting: Internal Medicine

## 2013-08-06 ENCOUNTER — Other Ambulatory Visit: Payer: Self-pay | Admitting: Internal Medicine

## 2013-08-06 DIAGNOSIS — E559 Vitamin D deficiency, unspecified: Secondary | ICD-10-CM

## 2013-08-06 MED ORDER — VITAMIN D (ERGOCALCIFEROL) 1.25 MG (50000 UNIT) PO CAPS: 50000.0000 [IU] | ORAL_CAPSULE | ORAL | Status: DC

## 2013-08-16 ENCOUNTER — Other Ambulatory Visit: Payer: Self-pay

## 2013-09-13 ENCOUNTER — Ambulatory Visit (INDEPENDENT_AMBULATORY_CARE_PROVIDER_SITE_OTHER): Payer: 59 | Admitting: Family Medicine

## 2013-09-13 ENCOUNTER — Encounter: Payer: Self-pay | Admitting: Family Medicine

## 2013-09-13 DIAGNOSIS — M7750 Other enthesopathy of unspecified foot: Secondary | ICD-10-CM

## 2013-09-13 DIAGNOSIS — M7742 Metatarsalgia, left foot: Secondary | ICD-10-CM | POA: Insufficient documentation

## 2013-09-13 NOTE — Assessment & Plan Note (Signed)
While patient ambulated and did notice the patient does have some weakness of the hip abductor's. Patient was given exercises that could be beneficial and will help his foot pain as well. Custom orthotics were made including a he'll wedge on the left side. This did help his leg length discrepancy as well. Patient is to increase wearing this slowly over the course of the next week Home exercises given Discuss can come back any time for any changes to need to be necessary Will followup on an as-needed basis. If he continues to have pain I would like to do an ultrasound to make sure there is no true stress fracture as well as potentially a Morton's neuroma.

## 2013-09-13 NOTE — Progress Notes (Signed)
  CC: foot pain  HPI: Patient is coming in with left foot pain. Patient states that he has had this for multiple months. Patient can notice it more when he was playing tennis and now is noticing that during the regular day. Patient points to pain mostly been over the lateral forefoot of the left foot. Patient denies any true injury. Patient states he seen another provider and his orthopedic surgeon who did state that he had more of a metatarsalgia. Patient did have x-rays were reviewed by me today. Patient's x-rays show patient is a pedis cavus but otherwise no bony abnormality. Patient describes the pain is more of a dull aching pain. Patient is able to stimulate without any significant discomfort. Patient denies any nighttime awakening. Patient is here to see if custom orthotics could be of benefit.   Past medical, surgical, family and social history reviewed. Medications reviewed all in the electronic medical record.   Review of Systems: No headache, visual changes, nausea, vomiting, diarrhea, constipation, dizziness, abdominal pain, skin rash, fevers, chills, night sweats, weight loss, swollen lymph nodes, body aches, joint swelling, muscle aches, chest pain, shortness of breath, mood changes.   Objective:    Vitals reviewed   General: No apparent distress alert and oriented x3 mood and affect normal, dressed appropriately.  HEENT: Pupils equal, extraocular movements intact Respiratory: Patient's speak in full sentences and does not appear short of breath Cardiovascular: No lower extremity edema, non tender, no erythema Skin: Warm dry intact with no signs of infection or rash on extremities or on axial skeleton. Abdomen: Soft nontender Neuro: Cranial nerves II through XII are intact, neurovascularly intact in all extremities with 2+ DTRs and 2+ pulses. Lymph: No lymphadenopathy of posterior or anterior cervical chain or axillae bilaterally.  Gait normal with good balance and coordination.   MSK: Non tender with full range of motion and good stability and symmetric strength and tone of shoulders, elbows, wrist, hip, knee and ankles bilaterally.  Foot exam: Shows patient does have severe post cavus bilaterally left greater than right. Patient also has a leg length discrepancy of quarter of an inch on the left leg shorter. Patient does have breakdown of the transverse arch causing more stress over the second and third metatarsals which patient does have a callus on the bottom of the foot. Neurovascularly intact distally.  Procedure Note Patient was fitted for a : standard, cushioned, semi-rigid orthotic. The orthotic was heated and afterward the patient stood on the orthotic blank positioned on the orthotic stand. The patient was positioned in subtalar neutral position and 10 degrees of ankle dorsiflexion in a weight bearing stance. After completion of molding, a stable base was applied to the orthotic blank. The blank was ground to a stable position for weight bearing. Size:10 Base: [facility] and Padding: None The patient ambulated these, and they were very comfortable.  I spent 60 minutes with this patient, greater than 50% was face-to-face time counseling regarding the below diagnosis.   Impression and Recommendations:

## 2013-09-25 ENCOUNTER — Ambulatory Visit: Payer: 59 | Admitting: Internal Medicine

## 2013-11-22 ENCOUNTER — Telehealth: Payer: Self-pay

## 2013-11-22 ENCOUNTER — Other Ambulatory Visit: Payer: Self-pay | Admitting: Family Medicine

## 2013-11-22 DIAGNOSIS — R21 Rash and other nonspecific skin eruption: Secondary | ICD-10-CM

## 2013-11-22 MED ORDER — FLUTICASONE PROPIONATE 0.05 % EX CREA: TOPICAL_CREAM | Freq: Two times a day (BID) | CUTANEOUS | Status: DC

## 2013-11-22 MED ORDER — FLUTICASONE PROPIONATE 0.005 % EX OINT: 1.0000 "application " | TOPICAL_OINTMENT | Freq: Two times a day (BID) | CUTANEOUS | Status: DC

## 2013-11-22 NOTE — Telephone Encounter (Signed)
Message copied by Ewing Schlein on Thu Nov 22, 2013  3:09 PM ------      Message from: Kathlene November E      Created: Thu Nov 22, 2013  3:01 PM      Regarding: RE: Kendrick Fries could you send this rx?  :)       Thx!!            ----- Message -----         From: Ewing Schlein, CMA         Sent: 11/22/2013   2:48 PM           To: Colon Branch, MD      Subject: RE: Kendrick Fries could you send this rx?  :)                   It has been sent       KP      ----- Message -----         From: Colon Branch, MD         Sent: 11/22/2013   2:28 PM           To: Rosalita Chessman, DO, Ewing Schlein, CMA      Subject: Kendrick Fries could you send this rx?  :)                       Could you ok a rx for me? (Med center in [place])      Fluticasone propionate ointment 0.005%      Apply bid prn 30 mg , 3RF             ------

## 2013-11-22 NOTE — Telephone Encounter (Signed)
Rx was sent for the cream instead of the ointment. Rx resent     KP

## 2014-01-24 ENCOUNTER — Other Ambulatory Visit: Payer: Self-pay | Admitting: Family Medicine

## 2014-01-24 DIAGNOSIS — C6290 Malignant neoplasm of unspecified testis, unspecified whether descended or undescended: Secondary | ICD-10-CM

## 2014-01-31 ENCOUNTER — Ambulatory Visit (INDEPENDENT_AMBULATORY_CARE_PROVIDER_SITE_OTHER)
Admission: RE | Admit: 2014-01-31 | Discharge: 2014-01-31 | Disposition: A | Discharge status: Home / Self Care | Payer: 59 | Source: Ambulatory Visit | Attending: Family Medicine | Admitting: Family Medicine

## 2014-01-31 DIAGNOSIS — C6290 Malignant neoplasm of unspecified testis, unspecified whether descended or undescended: Secondary | ICD-10-CM

## 2014-01-31 MED ORDER — IOHEXOL 300 MG/ML  SOLN
100.0000 mL | Freq: Once | INTRAMUSCULAR | Status: AC | PRN
  Administered 2014-01-31: 100 mL via INTRAVENOUS

## 2014-02-02 ENCOUNTER — Encounter: Payer: Self-pay | Admitting: Family Medicine

## 2014-02-07 ENCOUNTER — Inpatient Hospital Stay: Admission: RE | Admit: 2014-02-07 | Payer: 59 | Source: Ambulatory Visit

## 2014-02-07 ENCOUNTER — Ambulatory Visit (INDEPENDENT_AMBULATORY_CARE_PROVIDER_SITE_OTHER)
Admission: RE | Admit: 2014-02-07 | Discharge: 2014-02-07 | Disposition: A | Discharge status: Home / Self Care | Payer: 59 | Source: Ambulatory Visit | Attending: Family Medicine | Admitting: Family Medicine

## 2014-02-07 DIAGNOSIS — C6290 Malignant neoplasm of unspecified testis, unspecified whether descended or undescended: Secondary | ICD-10-CM

## 2014-02-15 ENCOUNTER — Telehealth: Payer: Self-pay

## 2014-02-15 MED ORDER — DILTIAZEM GEL 2 %: 1.0000 "application " | Freq: Two times a day (BID) | CUTANEOUS | Status: DC

## 2014-02-15 NOTE — Telephone Encounter (Signed)
Message copied by Algernon Huxley on Fri Feb 15, 2014 11:12 AM ------      Message from: Irene Shipper      Created: Fri Feb 15, 2014 11:10 AM      Regarding: RE: diltiazem       Jos, happy to refill. I will copy my nurse Vaughan Basta. Hold all is well otherwise. [name]      ----- Message -----         From: Colon Branch, MD         Sent: 02/15/2014  10:39 AM           To: Irene Shipper, MD      Subject: diltiazem                                                Hi [name],      I was wonder if you could refill a diltiazem ointment you prescribed to me few years ago [facility]) , I'm having again problems w/ hemorrhoids and probably  a fissure .      I'm planning to schedule a Cscope with you within few months as I'm 53 y/o.      If you prefer to see me please let me know !      Thanks, Daniel Lam              ------

## 2014-02-15 NOTE — Telephone Encounter (Signed)
Script sent to pharmacy.

## 2014-02-28 ENCOUNTER — Other Ambulatory Visit: Payer: Self-pay | Admitting: Internal Medicine

## 2014-02-28 MED ORDER — VALACYCLOVIR HCL 1 G PO TABS: 1000.0000 mg | ORAL_TABLET | Freq: Every day | ORAL | Status: DC

## 2014-03-12 ENCOUNTER — Ambulatory Visit: Payer: 59

## 2014-03-12 ENCOUNTER — Other Ambulatory Visit: Payer: Self-pay | Admitting: Family Medicine

## 2014-03-12 DIAGNOSIS — R42 Dizziness and giddiness: Secondary | ICD-10-CM

## 2014-03-21 ENCOUNTER — Other Ambulatory Visit: Payer: Self-pay

## 2014-03-21 MED ORDER — ALBUTEROL SULFATE HFA 108 (90 BASE) MCG/ACT IN AERS: 2.0000 | INHALATION_SPRAY | Freq: Four times a day (QID) | RESPIRATORY_TRACT | Status: DC

## 2014-03-21 NOTE — Telephone Encounter (Signed)
Rx sent to the pharmacy.     KP 

## 2014-07-24 ENCOUNTER — Encounter: Payer: Self-pay | Admitting: Internal Medicine

## 2014-07-24 ENCOUNTER — Other Ambulatory Visit: Payer: Self-pay

## 2014-07-24 DIAGNOSIS — Z1211 Encounter for screening for malignant neoplasm of colon: Secondary | ICD-10-CM

## 2014-08-05 ENCOUNTER — Other Ambulatory Visit: Payer: Self-pay | Admitting: Family Medicine

## 2014-08-05 ENCOUNTER — Telehealth: Payer: Self-pay

## 2014-08-05 ENCOUNTER — Ambulatory Visit (HOSPITAL_BASED_OUTPATIENT_CLINIC_OR_DEPARTMENT_OTHER)
Admission: RE | Admit: 2014-08-05 | Discharge: 2014-08-05 | Disposition: A | Discharge status: Home / Self Care | Payer: 59 | Source: Ambulatory Visit | Attending: Family Medicine | Admitting: Family Medicine

## 2014-08-05 DIAGNOSIS — M533 Sacrococcygeal disorders, not elsewhere classified: Secondary | ICD-10-CM

## 2014-08-05 DIAGNOSIS — S3992XA Unspecified injury of lower back, initial encounter: Secondary | ICD-10-CM | POA: Diagnosis not present

## 2014-08-05 DIAGNOSIS — Y9355 Activity, bike riding: Secondary | ICD-10-CM | POA: Insufficient documentation

## 2014-08-05 DIAGNOSIS — X58XXXA Exposure to other specified factors, initial encounter: Secondary | ICD-10-CM | POA: Diagnosis not present

## 2014-08-05 NOTE — Telephone Encounter (Signed)
Colon Branch, MD    Ewing Schlein, CMA Cc: Colon Branch, MD             With the ok of Dr Etter Sjogren, could you enter a order for a  Sacro Coccyx XR -- Dx fall, r/o sacral fracture   Is for me J Howden DOB July 30, 2061   THanks!!     Order has been placed.     KP

## 2014-08-15 ENCOUNTER — Ambulatory Visit (AMBULATORY_SURGERY_CENTER): Payer: Self-pay | Admitting: *Deleted

## 2014-08-15 VITALS — Ht 70.0 in | Wt 207.4 lb

## 2014-08-15 DIAGNOSIS — Z1211 Encounter for screening for malignant neoplasm of colon: Secondary | ICD-10-CM

## 2014-08-15 MED ORDER — MOVIPREP 100 G PO SOLR: 1.0000 | Freq: Once | ORAL | Status: DC

## 2014-08-15 NOTE — Progress Notes (Signed)
Denies allergies to eggs or soy products. Denies complications with sedation or anesthesia. Denies O2 use. Denies use of diet or weight loss medications.  Emmi instructions given for colonoscopy.  

## 2014-09-02 ENCOUNTER — Other Ambulatory Visit: Payer: Self-pay | Admitting: Internal Medicine

## 2014-09-02 ENCOUNTER — Ambulatory Visit (AMBULATORY_SURGERY_CENTER): Payer: 59 | Admitting: Internal Medicine

## 2014-09-02 ENCOUNTER — Encounter: Payer: Self-pay | Admitting: Internal Medicine

## 2014-09-02 VITALS — BP 117/72 | HR 56 | Temp 97.8°F | Resp 14 | Ht 70.0 in | Wt 207.0 lb

## 2014-09-02 DIAGNOSIS — D126 Benign neoplasm of colon, unspecified: Secondary | ICD-10-CM

## 2014-09-02 DIAGNOSIS — D124 Benign neoplasm of descending colon: Secondary | ICD-10-CM

## 2014-09-02 DIAGNOSIS — Z1211 Encounter for screening for malignant neoplasm of colon: Secondary | ICD-10-CM

## 2014-09-02 MED ORDER — SODIUM CHLORIDE 0.9 % IV SOLN: 500.0000 mL | INTRAVENOUS | Status: DC

## 2014-09-02 NOTE — Progress Notes (Signed)
Called to room to assist during endoscopic procedure.  Patient ID and intended procedure confirmed with present staff. Received instructions for my participation in the procedure from the performing physician.  

## 2014-09-02 NOTE — Progress Notes (Signed)
A/ox3 pleased with MAC, report to Suzanne RN 

## 2014-09-02 NOTE — Op Note (Signed)
[facility]  [address]. [place], [id number]   COLONOSCOPY PROCEDURE REPORT  PATIENT: Daniel Lam, Daniel Lam  MR#: [id number] BIRTHDATE: 10/28/1960 , 53  yrs. old GENDER: male ENDOSCOPIST: Eustace Quail, MD REFERRED BY:.Direct Self PROCEDURE DATE:  09/02/2014 PROCEDURE:   Colonoscopy with snare polypectomy x 3 First Screening Colonoscopy - Avg.  risk and is 50 yrs.  old or older Yes.  Prior Negative Screening - Now for repeat screening. N/A  History of Adenoma - Now for follow-up colonoscopy & has been > or = to 3 yrs.  N/A  Polyps Removed Today? Yes. ASA CLASS:   Class II INDICATIONS:average risk for colorectal cancer. MEDICATIONS: Monitored anesthesia care and Propofol 300 mg IV  DESCRIPTION OF PROCEDURE:   After the risks benefits and alternatives of the procedure were thoroughly explained, informed consent was obtained.  The digital rectal exam revealed no abnormalities of the rectum.   The [id number]  endoscope was introduced through the anus and advanced to the cecum, which was identified by both the appendix and ileocecal valve. No adverse events experienced.   The quality of the prep was excellent, using MoviPrep  The instrument was then slowly withdrawn as the colon was fully examined.   COLON FINDINGS: Three hyperplastic-appearing polyps measuring 2-3 mm in size were found in the descending colon.  A polypectomy was performed with a cold snare.  The resection was complete, the polyp tissue was completely retrieved and sent to histology.   The examination was otherwise normal.  Retroflexed views revealed internal hemorrhoids. The time to cecum=2 minutes 52 seconds. Withdrawal time=16 minutes 01 seconds.  The scope was withdrawn and the procedure completed. COMPLICATIONS: There were no immediate complications.  ENDOSCOPIC IMPRESSION: 1.   Three tiny polyps were found in the descending colon; polypectomy was performed with a cold snare 2.   The  examination was otherwise normal  RECOMMENDATIONS: 1. Repeat colonoscopy in 5 years if polyp adenomatous; otherwise 10 years  eSigned:  Eustace Quail, MD 09/02/2014 4:33 PM   cc: Kathlene November, MD and Rosalita Chessman, DO

## 2014-09-02 NOTE — Patient Instructions (Signed)
YOU HAD AN ENDOSCOPIC PROCEDURE TODAY AT THE [facility]: Refer to the procedure report that was given to you for any specific questions about what was found during the examination.  If the procedure report does not answer your questions, please call your gastroenterologist to clarify.  If you requested that your care partner not be given the details of your procedure findings, then the procedure report has been included in a sealed envelope for you to review at your convenience later.  YOU SHOULD EXPECT: Some feelings of bloating in the abdomen. Passage of more gas than usual.  Walking can help get rid of the air that was put into your GI tract during the procedure and reduce the bloating. If you had a lower endoscopy (such as a colonoscopy or flexible sigmoidoscopy) you may notice spotting of blood in your stool or on the toilet paper. If you underwent a bowel prep for your procedure, then you may not have a normal bowel movement for a few days.  DIET: Your first meal following the procedure should be a light meal and then it is ok to progress to your normal diet.  A half-sandwich or bowl of soup is an example of a good first meal.  Heavy or fried foods are harder to digest and may make you feel nauseous or bloated.  Likewise meals heavy in dairy and vegetables can cause extra gas to form and this can also increase the bloating.  Drink plenty of fluids but you should avoid alcoholic beverages for 24 hours.  ACTIVITY: Your care partner should take you home directly after the procedure.  You should plan to take it easy, moving slowly for the rest of the day.  You can resume normal activity the day after the procedure however you should NOT DRIVE or use heavy machinery for 24 hours (because of the sedation medicines used during the test).    SYMPTOMS TO REPORT IMMEDIATELY: A gastroenterologist can be reached at any hour.  During normal business hours, 8:30 AM to 5:00 PM Monday through Friday,  call [phone].  After hours and on weekends, please call the GI answering service at [phone] who will take a message and have the physician on call contact you.   Following lower endoscopy (colonoscopy or flexible sigmoidoscopy):  Excessive amounts of blood in the stool  Significant tenderness or worsening of abdominal pains  Swelling of the abdomen that is new, acute  Fever of 100F or higher    FOLLOW UP: If any biopsies were taken you will be contacted by phone or by letter within the next 1-3 weeks.  Call your gastroenterologist if you have not heard about the biopsies in 3 weeks.  Our staff will call the home number listed on your records the next business day following your procedure to check on you and address any questions or concerns that you may have at that time regarding the information given to you following your procedure. This is a courtesy call and so if there is no answer at the home number and we have not heard from you through the emergency physician on call, we will assume that you have returned to your regular daily activities without incident.  SIGNATURES/CONFIDENTIALITY: You and/or your care partner have signed paperwork which will be entered into your electronic medical record.  These signatures attest to the fact that that the information above on your After Visit Summary has been reviewed and is understood.  Full responsibility of the confidentiality   of this discharge information lies with you and/or your care-partner.     

## 2014-09-03 ENCOUNTER — Telehealth: Payer: Self-pay | Admitting: *Deleted

## 2014-09-03 NOTE — Telephone Encounter (Signed)
Left message that we called for f/u 

## 2014-09-04 ENCOUNTER — Encounter: Payer: 59 | Admitting: Internal Medicine

## 2014-09-10 ENCOUNTER — Encounter: Payer: Self-pay | Admitting: Internal Medicine

## 2014-10-15 ENCOUNTER — Other Ambulatory Visit: Payer: Self-pay | Admitting: Family Medicine

## 2014-10-15 MED ORDER — LIDOCAINE 5 % EX OINT: 1.0000 "application " | TOPICAL_OINTMENT | CUTANEOUS | Status: DC | PRN

## 2014-12-24 ENCOUNTER — Other Ambulatory Visit: Payer: Self-pay

## 2014-12-24 MED ORDER — LIDOCAINE 5 % EX OINT: 1.0000 "application " | TOPICAL_OINTMENT | CUTANEOUS | Status: DC | PRN

## 2015-07-24 ENCOUNTER — Other Ambulatory Visit: Payer: Self-pay | Admitting: Dermatology

## 2015-07-31 ENCOUNTER — Other Ambulatory Visit: Payer: Self-pay

## 2015-07-31 MED ORDER — PSEUDOEPHEDRINE HCL 30 MG PO TABS: 30.0000 mg | ORAL_TABLET | Freq: Four times a day (QID) | ORAL | Status: DC | PRN

## 2015-09-26 ENCOUNTER — Other Ambulatory Visit: Payer: Self-pay

## 2015-09-26 ENCOUNTER — Other Ambulatory Visit: Payer: Self-pay | Admitting: Family Medicine

## 2015-09-26 MED ORDER — FLUTICASONE PROPIONATE 0.005 % EX OINT: 1.0000 "application " | TOPICAL_OINTMENT | Freq: Two times a day (BID) | CUTANEOUS | Status: DC

## 2015-09-26 MED ORDER — VALACYCLOVIR HCL 1 G PO TABS: 1000.0000 mg | ORAL_TABLET | Freq: Every day | ORAL | Status: DC

## 2015-11-26 ENCOUNTER — Other Ambulatory Visit: Payer: Self-pay | Admitting: Family Medicine

## 2015-11-26 DIAGNOSIS — Z8547 Personal history of malignant neoplasm of testis: Secondary | ICD-10-CM

## 2015-11-27 ENCOUNTER — Other Ambulatory Visit (INDEPENDENT_AMBULATORY_CARE_PROVIDER_SITE_OTHER): Payer: 59

## 2015-11-27 DIAGNOSIS — Z8547 Personal history of malignant neoplasm of testis: Secondary | ICD-10-CM | POA: Diagnosis not present

## 2015-11-27 LAB — LDL CHOLESTEROL, DIRECT: Direct LDL: 119 mg/dL

## 2015-11-27 LAB — HCG, QUANTITATIVE, PREGNANCY: QUANTITATIVE HCG: 0.27 m[IU]/mL

## 2015-11-28 LAB — TESTOSTERONE TOTAL,FREE,BIO, MALES
ALBUMIN: 4.3 g/dL (ref 3.6–5.1)
SEX HORMONE BINDING: 49 nmol/L (ref 10–50)
TESTOSTERONE FREE: 30 pg/mL — AB (ref 47.0–244.0)
Testosterone, Bioavailable: 59 ng/dL — ABNORMAL LOW (ref 130.5–681.7)
Testosterone: 325 ng/dL (ref 250–827)

## 2015-11-28 LAB — AFP TUMOR MARKER: AFP TUMOR MARKER: 3 ng/mL (ref <6.1)

## 2015-12-01 ENCOUNTER — Ambulatory Visit (HOSPITAL_BASED_OUTPATIENT_CLINIC_OR_DEPARTMENT_OTHER)
Admission: RE | Admit: 2015-12-01 | Discharge: 2015-12-01 | Disposition: A | Discharge status: Home / Self Care | Payer: 59 | Source: Ambulatory Visit | Attending: Family Medicine | Admitting: Family Medicine

## 2015-12-01 ENCOUNTER — Other Ambulatory Visit: Payer: 59

## 2015-12-01 ENCOUNTER — Other Ambulatory Visit: Payer: Self-pay

## 2015-12-01 ENCOUNTER — Other Ambulatory Visit: Payer: Self-pay | Admitting: Family Medicine

## 2015-12-01 ENCOUNTER — Other Ambulatory Visit: Payer: Self-pay | Admitting: Internal Medicine

## 2015-12-01 DIAGNOSIS — J45909 Unspecified asthma, uncomplicated: Secondary | ICD-10-CM | POA: Insufficient documentation

## 2015-12-01 DIAGNOSIS — Z8547 Personal history of malignant neoplasm of testis: Secondary | ICD-10-CM

## 2015-12-01 DIAGNOSIS — R7989 Other specified abnormal findings of blood chemistry: Secondary | ICD-10-CM

## 2015-12-01 DIAGNOSIS — F17210 Nicotine dependence, cigarettes, uncomplicated: Secondary | ICD-10-CM | POA: Diagnosis not present

## 2015-12-01 DIAGNOSIS — F17200 Nicotine dependence, unspecified, uncomplicated: Secondary | ICD-10-CM | POA: Diagnosis not present

## 2015-12-01 LAB — LACTATE DEHYDROGENASE: LDH: 204 U/L (ref 94–250)

## 2015-12-01 MED ORDER — ALBUTEROL SULFATE HFA 108 (90 BASE) MCG/ACT IN AERS: 2.0000 | INHALATION_SPRAY | Freq: Four times a day (QID) | RESPIRATORY_TRACT | Status: DC

## 2015-12-01 MED FILL — VENTOLIN HFA 90 MCG INHALER: 108 (90 BAS | 30 days supply | Qty: 18 | Fill #0

## 2015-12-04 ENCOUNTER — Other Ambulatory Visit: Payer: Self-pay

## 2015-12-04 DIAGNOSIS — R945 Abnormal results of liver function studies: Principal | ICD-10-CM

## 2015-12-04 DIAGNOSIS — R7989 Other specified abnormal findings of blood chemistry: Secondary | ICD-10-CM

## 2015-12-05 ENCOUNTER — Other Ambulatory Visit (INDEPENDENT_AMBULATORY_CARE_PROVIDER_SITE_OTHER): Payer: 59

## 2015-12-05 DIAGNOSIS — E291 Testicular hypofunction: Secondary | ICD-10-CM

## 2015-12-05 DIAGNOSIS — R7989 Other specified abnormal findings of blood chemistry: Secondary | ICD-10-CM

## 2015-12-05 DIAGNOSIS — R945 Abnormal results of liver function studies: Secondary | ICD-10-CM

## 2015-12-05 LAB — T4, FREE: FREE T4: 0.98 ng/dL (ref 0.60–1.60)

## 2015-12-05 LAB — COMPREHENSIVE METABOLIC PANEL
ALT: 31 U/L (ref 0–53)
AST: 22 U/L (ref 0–37)
Albumin: 4.2 g/dL (ref 3.5–5.2)
Alkaline Phosphatase: 62 U/L (ref 39–117)
BILIRUBIN TOTAL: 0.5 mg/dL (ref 0.2–1.2)
BUN: 15 mg/dL (ref 6–23)
CO2: 29 meq/L (ref 19–32)
CREATININE: 1.02 mg/dL (ref 0.40–1.50)
Calcium: 9.1 mg/dL (ref 8.4–10.5)
Chloride: 105 mEq/L (ref 96–112)
GFR: 81.25 mL/min (ref >60.00)
GLUCOSE: 86 mg/dL (ref 70–99)
Potassium: 4 mEq/L (ref 3.5–5.1)
SODIUM: 138 meq/L (ref 135–145)
Total Protein: 6.6 g/dL (ref 6.0–8.3)

## 2015-12-05 LAB — TSH: TSH: 1.08 u[IU]/mL (ref 0.35–4.50)

## 2015-12-05 LAB — CORTISOL: CORTISOL PLASMA: 8.2 ug/dL

## 2015-12-05 LAB — LUTEINIZING HORMONE: LH: 4.83 m[IU]/mL (ref 1.50–9.30)

## 2015-12-05 LAB — PROLACTIN: Prolactin: 6.7 ng/mL (ref 2.0–18.0)

## 2015-12-05 LAB — FOLLICLE STIMULATING HORMONE: FSH: 17.9 m[IU]/mL (ref 1.4–18.1)

## 2015-12-05 LAB — T3, FREE: T3 FREE: 3.4 pg/mL (ref 2.3–4.2)

## 2015-12-08 LAB — ACTH: C206 ACTH: 10 pg/mL (ref 6–50)

## 2015-12-08 LAB — TESTOS,TOTAL,FREE AND SHBG (FEMALE)
SEX HORMONE BINDING GLOB.: 34 nmol/L (ref 10–50)
TESTOSTERONE,FREE: 65.7 pg/mL (ref 35.0–155.0)
TESTOSTERONE,TOTAL,LC/MS/MS: 483 ng/dL (ref 250–1100)

## 2015-12-10 LAB — ESTRADIOL, FREE
ESTRADIOL FREE: 0.32 pg/mL (ref <=0.45)
ESTRADIOL: 16 pg/mL (ref <=29)

## 2015-12-10 LAB — INSULIN-LIKE GROWTH FACTOR
IGF-I, LC/MS: 143 ng/mL (ref 50–317)
Z-SCORE (MALE): 0 {STDV} (ref ?–2.0)

## 2015-12-16 ENCOUNTER — Ambulatory Visit (INDEPENDENT_AMBULATORY_CARE_PROVIDER_SITE_OTHER): Payer: 59 | Admitting: Internal Medicine

## 2015-12-16 ENCOUNTER — Encounter: Payer: Self-pay | Admitting: Internal Medicine

## 2015-12-16 DIAGNOSIS — E291 Testicular hypofunction: Secondary | ICD-10-CM | POA: Diagnosis not present

## 2015-12-16 DIAGNOSIS — R7989 Other specified abnormal findings of blood chemistry: Secondary | ICD-10-CM | POA: Insufficient documentation

## 2015-12-16 NOTE — Patient Instructions (Signed)
Please come back as needed.  Please ask your PCP to check a B12 and D vitamin.

## 2015-12-16 NOTE — Progress Notes (Signed)
Patient ID: Daniel Lam, male   DOB: [birth year], 55 y.o.   MRN: [id number]  HPI: Daniel Lam is a 55 y.o.-year-old man, self-referred, for management of hypogonadism.  Last visit was more than 2 years ago.  He contacted me approximately 2 weeks ago about decreased libido and the fact that he obtained a morning testosterone level that returned low. At that point, I ordered a repeat testosterone level, by LC/MS/MS and advised him to come between 8 and 9 in the morning, fasting, to repeat the level. I also added additional pituitary workup. All the workup returned normal, including the total and free testosterone levels. He is here today to discuss the results.  He tells me that he started to have decreased libido in the last few months, despite the fact that he otherwise feels great. He denies problems with erections. He did not have any changes in his medications, lost weight, and is exercising consistently: running, and mountain biking.   He has had 2 days of prednisone 20 mg treatment in February of this year. However he took the tablet after the first testosterone level was drawn.  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 No recurrence. Followed by Dr Risa Grill with Urology. He was subsequently started on testosterone gel (Androgel, 4 pumps), under Dr. Almetta Lovely supervision. Due to elevated LFTs, he stopped testosterone with the subsequent slight improvement in his LFTs. He has seen gastroenterology in the past, Dr. Henrene Pastor: U/S liver has shown a fatty liver, RPR was negative, viral studies were negative, copper and iron disorders were ruled out. There was no excessive alcohol use or family history of liver disease. A CT scan from 2011 was reviewed by Dr. Henrene Pastor, and showed a normal liver. It was believed that his mild transaminitis could be from fatty liver disease.   Of note, he also has obstructive sleep apnea, moderate, on CPAP -  good compliance.  Patient also has a h/o high prolactin in 08/25/2009: 30.2,  Which has the normalized in 2012 and has remained normal since.   I reviewed his latest labs along with the patient:  Component     Latest Ref Rng 11/27/2015 12/01/2015 12/05/2015  Sodium     135 - 145 mEq/L   138  Potassium     3.5 - 5.1 mEq/L   4.0  Chloride     96 - 112 mEq/L   105  CO2     19 - 32 mEq/L   29  Glucose     70 - 99 mg/dL   86  BUN     6 - 23 mg/dL   15  Creatinine     0.40 - 1.50 mg/dL   1.02  Total Bilirubin     0.2 - 1.2 mg/dL   0.5  Alkaline Phosphatase     39 - 117 U/L   62  AST     0 - 37 U/L   22  ALT     0 - 53 U/L   31  Total Protein     6.0 - 8.3 g/dL   6.6  Albumin     3.5 - 5.2 g/dL 4.3  4.2  Calcium     8.4 - 10.5 mg/dL   9.1  GFR     >60.00 mL/min   81.25  Testosterone     250 - 827 ng/dL 325    Sex Horm Binding Glob, Serum  10 - 50 nmol/L 49    Testosterone Free     47.0 - 244.0 pg/mL 30.0 (L)    Testosterone, Bioavailable     130.5 - 681.7 ng/dL 59.0 (L)    Testosterone,Total,LC/MS/MS     250 - 1100 ng/dL   483  Testosterone, Free     35.0 - 155.0 pg/mL   65.7  Sex Hormone Binding Glob.     10 - 50 nmol/L   34  IGF-I, LC/MS     50 - 317 ng/mL   143  Z-Score (Male)     -2.0-+2.0 SD   0.0  Estradiol, Free     ADULTS: < OR = 0.45 pg/mL   0.32  Estradiol     ADULTS: < OR = 29 pg/mL   16  AFP Tumor Marker     <6.1 ng/mL 3.0    Quantitative HCG      0.27    LDH     94 - 250 U/L  204   TSH     0.35 - 4.50 uIU/mL   1.08  T4,Free(Direct)     0.60 - 1.60 ng/dL   0.98  Triiodothyronine,Free,Serum     2.3 - 4.2 pg/mL   3.4  FSH     1.4 - 18.1 mIU/ML   17.9  LH     1.50 - 9.30 mIU/mL   4.83  Prolactin     2.0 - 18.0 ng/mL   6.7  Cortisol, Plasma        8.2  C206 ACTH     6 - 50 pg/mL   10   Of note, at last visit, with diagnosed vitamin D deficiency and I suggested  To start supplementation. He is now off her vitamin D supplement.  His  vitamin B12 was also on the low side  So I suggested that he started B12  Supplement. He has started this, however, he felt more hungry, so he stopped.  He did not have a repeat of his B12 and vitamin D levels since 2014 when we checked  them last.  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 Lasik  1999  . Appendectomy  2000  . Orchiectomy      Lt. 08/2009  . Knee arthroscopy  09/01/2011    Procedure: ARTHROSCOPY KNEE;  Surgeon: Alta Corning;  Location: [facility];  Service: Orthopedics;  Laterality: Left;  medial and lateral meniscus tear debridment  and chondroplasty   History   Social History  . Marital Status: Married    Spouse Name: N/A    Number of Children: 3: 18-14-11 y/o   Occupational History  . physician     internal medicine   Social History Main Topics  . Smoking status: Former Smoker Smokes cigars  . Smokeless tobacco: Never Used  . Alcohol Use: Yes - 2 beers - weekends  . Drug Use: No  .  Sexual Activity: Yes    Partners: Female   Social History Narrative   Regular exercise: yes, very active   Caffeine use: 2 cups of coffee and soda's daily   Current Outpatient Prescriptions on File Prior to Visit  Medication Sig Dispense  Refill  . acetaminophen (TYLENOL) 500 MG tablet Take 500 mg by mouth every 6 (six) hours as needed.    Marland Kitchen albuterol (PROAIR HFA) 108 (90 BASE) MCG/ACT inhaler Inhale 2 puffs into the lungs every 4 (four) hours as needed.      Marland Kitchen albuterol (PROVENTIL HFA;VENTOLIN HFA) 108 (90 Base) MCG/ACT inhaler Inhale 2 puffs into the lungs 4 (four) times daily. 1 Inhaler 11  . ALPRAZolam (XANAX) 0.5 MG tablet Take 0.5 mg by mouth at bedtime as needed.      . diltiazem 2 % GEL Apply 1 application topically 2 (two) times daily. 30 g 0  . FLUoxetine (PROZAC) 20 MG capsule Take 20 mg by mouth daily.      . fluticasone (CUTIVATE) 0.005 % ointment Apply 1 application topically 2 (two) times daily. 30 g 5  . glycerin SUPP Place 1 Chip rectally as needed for moderate constipation.    Marland Kitchen ibuprofen (ADVIL,MOTRIN) 200 MG tablet Take 200 mg by mouth every 6 (six) hours as needed.    . lidocaine (XYLOCAINE) 5 % ointment Apply 1 application topically as needed. 35.44 g 3  . valACYclovir (VALTREX) 1000 MG tablet Take 1 tablet (1,000 mg total) by mouth daily. 90 tablet 3   No current facility-administered medications on file prior to visit.   Allergies  Allergen Reactions  . Demerol [Meperidine Hcl]     Difficulty voiding  . Latex     REACTION: SOB, rash, itching  . Morphine And Related     Difficulty voiding   Family History  Problem Relation Age of Onset  . Colon cancer Neg Hx   . Esophageal cancer Neg Hx   . Stomach cancer Neg Hx   . Rectal cancer Neg Hx   . Sjogren's syndrome Mother   . Colon polyps Father   . Dementia Father   . Throat cancer Father   . Throat cancer      Larynx  . Melanoma Brother    PE: BP 114/68 mmHg  Pulse 65  Temp(Src) 98.1 F (36.7 C) (Oral)  Resp 12  Wt 213 lb (96.616 kg)  SpO2 96% Body mass index is 30.56 kg/(m^2). Wt Readings from Last 3 Encounters:  12/16/15 213 lb (96.616 kg)  09/02/14 207 lb (93.895 kg)  08/15/14 207 lb 6.4 oz (94.076 kg)   Constitutional: slightly  overweight, in NAD Eyes: PERRLA, EOMI, no exophthalmos ENT: moist mucous membranes, no thyromegaly, no cervical lymphadenopathy Cardiovascular: RRR, No MRG Respiratory: CTA B Gastrointestinal: abdomen soft, NT, ND, BS+ Musculoskeletal: no deformities, strength intact in all 4 Skin: moist, warm, no rashes Neurological: no tremor with outstretched hands, DTR normal in all 4 Genital exam: deferred  ASSESSMENT: 1. H/o hypogonadotropic hypogonadism Labs from 08/02/2002: - CMP normal, except: - AST 28, ALT 59 - iron: 111 (40-155), TIBC 301 (250-450), iron saturation 36% (15-55) - hepatitis C virus antibody, Hep B surface Ag, Hep B core Ab, total - all negative Labs prior to orchiectomy (08/19/2009): - prolactin 30.2 (2.1-17.7) - testosterone 196 ([id number]) - FSH 8.4, LH 2.4 - beta hCG <1 - LDH 262 (30-225) - BMP normal except glucose 117 - CBC with differential normal Labs 01/16/2010: - Prolactin 12.4 Labs 02/02/2011, on testosterone: -  Testosterone 277 (LLN 350) - AST 70, ALT 105 - prolactin 4.6 Labs 07/06/2011, off testosterone for 5 months: - testosterone 268  (LLN 350) and, LH 9.69 (1.5-9.3), FSH 20.7 (1.4-18.1) - AST 44, ALT 68 Labs 01/04/2013: - Lipid profile: 177/108/53/102 - CBC normal - CMP normal, including glucose 91.  - AST 26, ALT 40 - PSA 0.99 - TSH 1.722  2. History of testicular cancer - 2011 - Seminoma - Pathology:  Seminoma, 4.2 cm, limited left testis Angio-lymphatic invasion present Intratubular germ cell neoplasm present Resection margins clear - s/p left orchiectomy, 08/2009; s/p radiation therapy 09/2009 - Dr. Risa Grill, urology  3. Vit D deficiency  4. Low vitamin B12  PLAN:  1. Patient with history of testicular cancer, status post left orchiectomy, and with presumed testosterone deficiency secondary to the surgery.  - we reviewed together his recent labs and the ones from last visit. His latest testosterone level (12/05/2015), obtained in am  (after a light b'fast) was in the normal range. This is in contrast with the previous level (11/27/2015), which was low (also in am,  But after a regular b'fast). I explained that the LC/MS/MS assay is more sensitive and is usually used for women and also to confirm an abnormal finding. The fact that this is normal is very reassuring. I explained situations in which testosterone levels will fluctuate. I also explained how the normal range is determined for testoster levels (20-30 y/o healthy men, fasting, at 8-9 am). As of now, I do not feel he needs supplementation with testosterone, but we will repeat levels maybe in 6-12 mo. At that time, I also need to order the LC/MS/MS testosterone assay [Testosterone,Total,LC/MS/MS (Male) - [facility]] and an LH level.  3. And 4.  - vitamin D and B12 were low, and he is off supplementation >> advised to discuss with his PCP about rechecking the labs to see if he needs further supplementation. A low B12 level can definitely contribute to fatigue and possibly also to low libido.

## 2015-12-17 ENCOUNTER — Other Ambulatory Visit: Payer: Self-pay | Admitting: Family Medicine

## 2015-12-17 DIAGNOSIS — E291 Testicular hypofunction: Secondary | ICD-10-CM

## 2015-12-22 MED FILL — FLUoxetine HCL 20 MG CAPS: 20 | 90 days supply | Qty: 90 | Fill #1

## 2016-02-19 DIAGNOSIS — F411 Generalized anxiety disorder: Secondary | ICD-10-CM | POA: Diagnosis not present

## 2016-03-25 MED FILL — valACYclovir HCL 1 GM TABS: 1 | 90 days supply | Qty: 90 | Fill #1

## 2016-03-25 MED FILL — ALPRAZolam 0.5 MG TABS: 0.5 | 90 days supply | Qty: 270 | Fill #0

## 2016-03-25 MED FILL — FLUoxetine HCL 20 MG CAPS: 20 | 90 days supply | Qty: 90 | Fill #0

## 2016-04-01 ENCOUNTER — Telehealth: Payer: Self-pay

## 2016-04-01 MED ORDER — NONFORMULARY OR COMPOUNDED ITEM
Status: DC
Start: 2016-04-01 — End: 2023-06-30

## 2016-04-01 NOTE — Telephone Encounter (Signed)
Rx printed and given to the patient per PCP advice.    KP

## 2016-04-07 DIAGNOSIS — G4733 Obstructive sleep apnea (adult) (pediatric): Secondary | ICD-10-CM | POA: Diagnosis not present

## 2016-04-08 MED FILL — FLUTICASONE PROP 0.005% OIN: 0.005 | 15 days supply | Qty: 30 | Fill #1

## 2016-04-22 ENCOUNTER — Other Ambulatory Visit (INDEPENDENT_AMBULATORY_CARE_PROVIDER_SITE_OTHER): Payer: 59

## 2016-04-22 ENCOUNTER — Other Ambulatory Visit: Payer: Self-pay

## 2016-04-22 DIAGNOSIS — E538 Deficiency of other specified B group vitamins: Secondary | ICD-10-CM

## 2016-04-22 DIAGNOSIS — E559 Vitamin D deficiency, unspecified: Secondary | ICD-10-CM | POA: Diagnosis not present

## 2016-04-22 LAB — FOLATE: FOLATE: 15.1 ng/mL (ref >5.9)

## 2016-04-22 LAB — VITAMIN D 25 HYDROXY (VIT D DEFICIENCY, FRACTURES): VITD: 18.85 ng/mL — AB (ref 30.00–100.00)

## 2016-04-22 LAB — VITAMIN B12: Vitamin B-12: 382 pg/mL (ref 211–911)

## 2016-04-23 ENCOUNTER — Other Ambulatory Visit: Payer: Self-pay

## 2016-04-23 MED ORDER — VITAMIN D (ERGOCALCIFEROL) 1.25 MG (50000 UNIT) PO CAPS
50000.0000 [IU] | ORAL_CAPSULE | ORAL | Status: DC
Start: 2016-04-23 — End: 2019-07-26

## 2016-04-23 MED FILL — VIT D2 1.25 MG (50,000 UNIT: 1.25 MG | 28 days supply | Qty: 4 | Fill #0

## 2016-06-01 MED FILL — VIT D2 1.25 MG (50,000 UNIT: 1.25 MG | 28 days supply | Qty: 4 | Fill #1

## 2016-06-28 MED FILL — VIT D2 1.25 MG (50,000 UNIT: 1.25 MG | 28 days supply | Qty: 4 | Fill #2

## 2016-06-28 MED FILL — FLUoxetine HCL 20 MG CAPS: 20 | 90 days supply | Qty: 90 | Fill #1

## 2016-07-22 DIAGNOSIS — D225 Melanocytic nevi of trunk: Secondary | ICD-10-CM | POA: Diagnosis not present

## 2016-07-22 DIAGNOSIS — D485 Neoplasm of uncertain behavior of skin: Secondary | ICD-10-CM | POA: Diagnosis not present

## 2016-07-22 DIAGNOSIS — L814 Other melanin hyperpigmentation: Secondary | ICD-10-CM | POA: Diagnosis not present

## 2016-07-22 DIAGNOSIS — D235 Other benign neoplasm of skin of trunk: Secondary | ICD-10-CM | POA: Diagnosis not present

## 2016-08-16 ENCOUNTER — Other Ambulatory Visit: Payer: Self-pay | Admitting: Family Medicine

## 2016-08-16 DIAGNOSIS — Z0000 Encounter for general adult medical examination without abnormal findings: Secondary | ICD-10-CM

## 2016-08-16 DIAGNOSIS — R202 Paresthesia of skin: Secondary | ICD-10-CM

## 2016-08-16 DIAGNOSIS — R200 Anesthesia of skin: Secondary | ICD-10-CM

## 2016-08-16 MED FILL — valACYclovir HCL 1 GM TABS: 1 | 90 days supply | Qty: 90 | Fill #2

## 2016-08-16 MED FILL — ALPRAZolam 0.5 MG TABS: 0.5 | 90 days supply | Qty: 270 | Fill #1

## 2016-08-17 ENCOUNTER — Other Ambulatory Visit (INDEPENDENT_AMBULATORY_CARE_PROVIDER_SITE_OTHER): Payer: 59

## 2016-08-17 DIAGNOSIS — Z0000 Encounter for general adult medical examination without abnormal findings: Secondary | ICD-10-CM

## 2016-08-17 DIAGNOSIS — R200 Anesthesia of skin: Secondary | ICD-10-CM

## 2016-08-17 DIAGNOSIS — R202 Paresthesia of skin: Secondary | ICD-10-CM | POA: Diagnosis not present

## 2016-08-17 LAB — COMPREHENSIVE METABOLIC PANEL
ALT: 43 U/L (ref 0–53)
AST: 24 U/L (ref 0–37)
Albumin: 4.3 g/dL (ref 3.5–5.2)
Alkaline Phosphatase: 58 U/L (ref 39–117)
BUN: 17 mg/dL (ref 6–23)
CHLORIDE: 104 meq/L (ref 96–112)
CO2: 30 meq/L (ref 19–32)
Calcium: 9.4 mg/dL (ref 8.4–10.5)
Creatinine, Ser: 0.99 mg/dL (ref 0.40–1.50)
GFR: 83.87 mL/min (ref >60.00)
GLUCOSE: 104 mg/dL — AB (ref 70–99)
POTASSIUM: 4.2 meq/L (ref 3.5–5.1)
SODIUM: 139 meq/L (ref 135–145)
Total Bilirubin: 0.6 mg/dL (ref 0.2–1.2)
Total Protein: 6.5 g/dL (ref 6.0–8.3)

## 2016-08-17 LAB — VITAMIN B12: Vitamin B-12: 373 pg/mL (ref 211–911)

## 2016-08-17 LAB — LIPID PANEL
Cholesterol: 192 mg/dL (ref 0–200)
HDL: 60.5 mg/dL (ref >39.00)
LDL CALC: 111 mg/dL — AB (ref 0–99)
NONHDL: 131.71
Total CHOL/HDL Ratio: 3
Triglycerides: 104 mg/dL (ref 0.0–149.0)
VLDL: 20.8 mg/dL (ref 0.0–40.0)

## 2016-08-17 LAB — CBC WITH DIFFERENTIAL/PLATELET
BASOS PCT: 0.9 % (ref 0.0–3.0)
Basophils Absolute: 0 10*3/uL (ref 0.0–0.1)
EOS PCT: 4.7 % (ref 0.0–5.0)
Eosinophils Absolute: 0.2 10*3/uL (ref 0.0–0.7)
HCT: 43.8 % (ref 39.0–52.0)
Hemoglobin: 15.1 g/dL (ref 13.0–17.0)
LYMPHS ABS: 0.8 10*3/uL (ref 0.7–4.0)
Lymphocytes Relative: 20.3 % (ref 12.0–46.0)
MCHC: 34.4 g/dL (ref 30.0–36.0)
MCV: 95 fl (ref 78.0–100.0)
MONO ABS: 0.3 10*3/uL (ref 0.1–1.0)
Monocytes Relative: 6.1 % (ref 3.0–12.0)
NEUTROS PCT: 68 % (ref 43.0–77.0)
Neutro Abs: 2.8 10*3/uL (ref 1.4–7.7)
Platelets: 208 10*3/uL (ref 150.0–400.0)
RBC: 4.61 Mil/uL (ref 4.22–5.81)
RDW: 12.7 % (ref 11.5–15.5)
WBC: 4.2 10*3/uL (ref 4.0–10.5)

## 2016-08-17 LAB — VITAMIN D 25 HYDROXY (VIT D DEFICIENCY, FRACTURES): VITD: 28.01 ng/mL — ABNORMAL LOW (ref 30.00–100.00)

## 2016-08-17 LAB — PSA: PSA: 0.52 ng/mL (ref 0.10–4.00)

## 2016-08-17 LAB — TSH: TSH: 1.19 u[IU]/mL (ref 0.35–4.50)

## 2016-08-17 LAB — HEMOGLOBIN A1C: Hgb A1c MFr Bld: 5.5 % (ref 4.6–6.5)

## 2016-08-17 LAB — SEDIMENTATION RATE: Sed Rate: 2 mm/hr (ref 0–20)

## 2016-08-18 ENCOUNTER — Encounter: Payer: Self-pay | Admitting: Neurology

## 2016-08-18 ENCOUNTER — Ambulatory Visit (INDEPENDENT_AMBULATORY_CARE_PROVIDER_SITE_OTHER): Payer: 59 | Admitting: Neurology

## 2016-08-18 VITALS — BP 119/74 | HR 61 | Ht 70.0 in | Wt 213.5 lb

## 2016-08-18 DIAGNOSIS — R202 Paresthesia of skin: Secondary | ICD-10-CM | POA: Diagnosis not present

## 2016-08-18 DIAGNOSIS — B009 Herpesviral infection, unspecified: Secondary | ICD-10-CM

## 2016-08-18 NOTE — Progress Notes (Signed)
PATIENT: Daniel Lam DOB: 11-Sep-1961  Chief Complaint  Patient presents with  . Numbness    Reports numbness/tingling in his bilateral feet and hands that has gradually become worse over years.  His symptoms started after having radiation therapy in 2010.    Marland Kitchen PCP    Ann Held, DO     Daniel Lam is a 55 years old right-handed male, seen in refer by his primary care physician Dr. Cheri Rous for evaluation of recurrent bilateral lower extremity paresthesia, initially evaluation was on August 19 2016.    He was seen by Dr. love in the past for similar complaints, I was able to review and summarize Dr. Tressia Danas note in 2011, he had a history of genital herpes simplex virus type II since age 51, treated with valtrex 1 g twice daily for 3 days, on average every 6 months for recurrent symptoms. Symptoms was usually consisted of burning along his inner thigh, or buttock region, initially was on either left or right, if he did not take Valtrex, blister will appear, intermittent dosing of Valtrex has been a successful therapy until 2010.  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  Since that time he had increase in the frequency of HSV-2 outbreaks, as often as every 2 months per record, he had one episode in March 2011, he noticed burning at bilateral inner thigh extending to his knee level even to his ankles without rash. Paresthesia also involving bilateral buttocks, lower abdomen pelvic region, he noticed blisters at bilateral lower abdominal wall, up to periumbilical region, he denied bowel and bladder incontinence. He was diagnosed with myelitis, herpes simplex type II, was treated with his usual higher dose of Valtrex for a short course with significant improvement.  However, since that episode in March 2011, he  noticed that if he continues to down Valtrex, such as stop it totally, he would get bilateral lower extremity paresthesia, so over the past almost 6 years, he has been taking daily Valtrex 500 mg daily, every few months, he would have recurrent bilateral lower extremity paresthesia, but no longer noticed any significant blisters, he would be treated with higher dose of Valtrex 500mg  2 tabs bid for 1 week, his symptoms would quickly improved.  Since 2017, he had more frequent similar recurrence, also occasionally bilateral fingertips paresthesia  He continued to be active, hiking without any difficulty, no bowel and bladder incontinence. But he does not like the idea of staying on antivirals treatment, and recent frequent recurrence. He hoped to rule out other etiology that potentially account for his recurrent symptoms.   REVIEW OF SYSTEMS: Full 14 system review of systems performed and notable only for As above   ALLERGIES: Allergies  Allergen Reactions  . Demerol [Meperidine Hcl]     Difficulty voiding  . Latex     REACTION: SOB, rash, itching  . Morphine And Related     Difficulty voiding    HOME MEDICATIONS: Current Outpatient Prescriptions  Medication Sig Dispense Refill  . acetaminophen (TYLENOL) 500 MG tablet Take 500 mg by mouth every 6 (six) hours as needed.    Marland Kitchen albuterol (PROVENTIL HFA;VENTOLIN HFA) 108 (90 Base) MCG/ACT inhaler Inhale 2 puffs into the lungs 4 (four) times daily. 1 Inhaler 11  . ALPRAZolam (XANAX) 0.5 MG tablet Take 0.5 mg  by mouth at bedtime as needed.      . diltiazem 2 % GEL Apply 1 application topically 2 (two) times daily. 30 g 0  . FLUoxetine (PROZAC) 20 MG capsule Take 20 mg by mouth daily.      . fluticasone (CUTIVATE) 0.005 % ointment Apply 1 application topically 2 (two) times daily. (Patient taking differently: Apply 1 application topically as needed. ) 30 g 5  . glycerin SUPP Place 1 Chip rectally as needed for moderate constipation.    Marland Kitchen ibuprofen  (ADVIL,MOTRIN) 200 MG tablet Take 200 mg by mouth every 6 (six) hours as needed.    . lidocaine (XYLOCAINE) 5 % ointment Apply 1 application topically as needed. 35.44 g 3  . NONFORMULARY OR COMPOUNDED ITEM RESMED Standard Air Tubing for CPAP 1 each 11  . NONFORMULARY OR COMPOUNDED ITEM ResMed Swift FX nasal pillows mask (Medium) 1 each 11  . valACYclovir (VALTREX) 1000 MG tablet Take 1 tablet (1,000 mg total) by mouth daily. 90 tablet 3  . Vitamin D, Ergocalciferol, (DRISDOL) 50000 units CAPS capsule Take 1 capsule (50,000 Units total) by mouth every 7 (seven) days. 4 capsule 2   No current facility-administered medications for this visit.     PAST MEDICAL HISTORY: Past Medical History:  Diagnosis Date  . Anxiety state, unspecified   . Atypical mole 2008  . Complication of anesthesia    had some difficulty voiding  . Dermatitis    anal dermatitis  . Hemorrhoids   . Herpes simplex without mention of complication   . 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 Numbness and tingling   . Other and unspecified anterior pituitary hyperfunction   . Sleep apnea   . Unspecified asthma(493.90)    controlled    PAST SURGICAL HISTORY: Past Surgical History:  Procedure Laterality Date  . APPENDECTOMY    . KNEE ARTHROSCOPY  09/01/2011   Procedure: ARTHROSCOPY KNEE;  Surgeon: Alta Corning;  Location: [facility];  Service: Orthopedics;  Laterality: Left;  medial and lateral meniscus tear debridment  and chondroplasty  . LASIK    . ORCHIECTOMY     Lt. 08/2009    FAMILY HISTORY: Family History  Problem Relation Age of Onset  . Sjogren's syndrome Mother   . COPD Mother   . Colon polyps Father   . Dementia Father   . Throat cancer Father   . Melanoma Brother   . Throat cancer      Larynx  . Colon cancer Neg Hx   . Esophageal cancer Neg Hx   . Stomach cancer Neg Hx   . Rectal cancer Neg Hx     SOCIAL HISTORY:  Social  History   Social History  . Marital status: Married    Spouse name: N/A  . Number of children: 3  . Years of education: MD   Occupational History  . physician     internal medicine   Social History Main Topics  . Smoking status: Current Some Day Smoker    Packs/day: 0.25    Types: Cigarettes, Cigars  . Smokeless tobacco: Never Used     Comment: Occasional cigar  . Alcohol use 0.0 oz/week     Comment: occ  . Drug use: No  . Sexual activity: Yes    Partners: Female   Other Topics Concern  . Not on file   Social History Narrative   Regular exercise: yes, very active  Caffeine use: 2 cups of coffee and soda's daily.   Right-handed.        PHYSICAL EXAM   Vitals:   08/18/16 1028  BP: 119/74  Pulse: 61  Weight: 213 lb 8 oz (96.8 kg)  Height: 5\' 10"  (1.778 m)    Not recorded      Body mass index is 30.63 kg/m.  PHYSICAL EXAMNIATION:  Gen: NAD, conversant, well nourised, obese, well groomed                     Cardiovascular: Regular rate rhythm, no peripheral edema, warm, nontender. Eyes: Conjunctivae clear without exudates or hemorrhage Neck: Supple, no carotid bruits. Pulmonary: Clear to auscultation bilaterally   NEUROLOGICAL EXAM:  MENTAL STATUS: Speech:    Speech is normal; fluent and spontaneous with normal comprehension.  Cognition:     Orientation to time, place and person     Normal recent and remote memory     Normal Attention span and concentration     Normal Language, naming, repeating,spontaneous speech     Fund of knowledge   CRANIAL NERVES: CN II: Visual fields are full to confrontation. Fundoscopic exam is normal with sharp discs and no vascular changes. Pupils are round equal and briskly reactive to light. CN III, IV, VI: extraocular movement are normal. No ptosis. CN V: Facial sensation is intact to pinprick in all 3 divisions bilaterally. Corneal responses are intact.  CN VII: Face is symmetric with normal eye closure and  smile. CN VIII: Hearing is normal to rubbing fingers CN IX, X: Palate elevates symmetrically. Phonation is normal. CN XI: Head turning and shoulder shrug are intact CN XII: Tongue is midline with normal movements and no atrophy.  MOTOR: There is no pronator drift of out-stretched arms. Muscle bulk and tone are normal. Muscle strength is normal.  REFLEXES: Reflexes are 2+ and symmetric at the biceps, triceps, knees, and ankles. Plantar responses are flexor.  SENSORY: Intact to light touch, pinprick, positional sensation and vibratory sensation are intact in fingers and toes.  COORDINATION: Rapid alternating movements and fine finger movements are intact. There is no dysmetria on finger-to-nose and heel-knee-shin.    GAIT/STANCE: Posture is normal. Gait is steady with normal steps, base, arm swing, and turning. Heel and toe walking are normal. Tandem gait is normal.  Romberg is absent.   DIAGNOSTIC DATA (LABS, IMAGING, TESTING) - I reviewed patient records, labs, notes, testing and imaging myself where available.   ASSESSMENT AND PLAN  Daniel Lam is a 55 y.o. male    History of genital herpes simplex type II Recurrent lower extremity paresthesia, to lower thoracic level, suggestive of lower thoracic myelitis Also intermittent bilateral upper extremity paresthesia  Need to rule out cervical spine involvement, proceed with MRI of cervical spine  Rule out other possible etiology such as nutritional deficiency, paraneoplastic syndrome, inflammatory process, lumbar roots pathology.  Proceed with MRI of lumbar spine with without contrast to rule out lumbar roots enhancement  Laboratory evaluations  Also advised him stop daily Valtrex use, if he has recurrent symptoms, may consider lumbar puncture to look for HSV PCR, or even CMV PCR   Marcial Pacas, M.D. Ph.D.  [facility] [address], [place], [place] [address] Ph: [phone] Fax: [phone]  CC:  Referring Provider

## 2016-08-19 LAB — FOLATE: Folate: 18.9 ng/mL (ref >5.9)

## 2016-08-20 ENCOUNTER — Telehealth: Payer: Self-pay | Admitting: Neurology

## 2016-08-20 LAB — METHYLMALONIC ACID, SERUM: METHYLMALONIC ACID, QUANT: 133 nmol/L (ref 87–318)

## 2016-08-20 NOTE — Telephone Encounter (Signed)
Error

## 2016-08-20 NOTE — Progress Notes (Signed)
Printed and sent letter with results to patient. LB 

## 2016-08-21 LAB — IMMUNOFIXATION ELECTROPHORESIS
IgA/Immunoglobulin A, Serum: 141 mg/dL (ref 90–386)
IgG (Immunoglobin G), Serum: 1046 mg/dL (ref 700–1600)
IgM (Immunoglobulin M), Srm: 83 mg/dL (ref 20–172)
Total Protein: 6.7 g/dL (ref 6.0–8.5)

## 2016-08-21 LAB — LYME, WESTERN BLOT, SERUM (REFLEXED)
IGG P28 AB.: ABSENT
IGG P39 AB.: ABSENT
IGG P45 AB.: ABSENT
IGG P93 AB.: ABSENT
IGM P41 AB.: ABSENT
IgG P18 Ab.: ABSENT
IgG P30 Ab.: ABSENT
IgG P58 Ab.: ABSENT
IgM P23 Ab.: ABSENT
IgM P39 Ab.: ABSENT
LYME IGG WB: NEGATIVE
LYME IGM WB: NEGATIVE

## 2016-08-21 LAB — HEPATITIS PANEL, ACUTE
HEP A IGM: NEGATIVE
HEP B C IGM: NEGATIVE
HEP B S AG: NEGATIVE

## 2016-08-21 LAB — ANGIOTENSIN CONVERTING ENZYME: Angio Convert Enzyme: 25 U/L (ref 14–82)

## 2016-08-21 LAB — HIV ANTIBODY (ROUTINE TESTING W REFLEX): HIV SCREEN 4TH GENERATION: NONREACTIVE

## 2016-08-21 LAB — CK: Total CK: 103 U/L (ref 24–204)

## 2016-08-21 LAB — B. BURGDORFI ANTIBODIES: LYME IGG/IGM AB: 2.88 {ISR} — AB (ref 0.00–0.90)

## 2016-08-21 LAB — RPR: RPR: NONREACTIVE

## 2016-08-21 LAB — COPPER, SERUM: Copper: 88 ug/dL (ref 72–166)

## 2016-08-21 LAB — ANA W/REFLEX IF POSITIVE: ANA: NEGATIVE

## 2016-08-23 ENCOUNTER — Telehealth: Payer: Self-pay | Admitting: Neurology

## 2016-08-23 DIAGNOSIS — G4733 Obstructive sleep apnea (adult) (pediatric): Secondary | ICD-10-CM | POA: Diagnosis not present

## 2016-08-23 NOTE — Telephone Encounter (Signed)
I called Dr. Larose Kells, the only abnormality on extensive laboratory evaluations are positive Lyme antibody, was in blocks showed positive IgG, P 21 44, 66  MRIs are pendings.

## 2016-08-23 NOTE — Telephone Encounter (Signed)
Patient is returning your call.  

## 2016-08-24 ENCOUNTER — Telehealth: Payer: Self-pay | Admitting: Family

## 2016-08-24 NOTE — Telephone Encounter (Signed)
Spoke with Dr. Larose Kells and informed him that his MRI orders have been sent to [facility] due to him having to go into a 3 Telsa machine per Dr. Krista Blue.

## 2016-08-24 NOTE — Telephone Encounter (Signed)
Pt called in to schedule MRI appt. Please call [phone]

## 2016-08-24 NOTE — Telephone Encounter (Signed)
Spoke with Dr. Larose Kells and informed him since Dr. Krista Blue wants him to be in a 3 Tesla machine I sent his order to GI.. I also gave him their phone number [phone] if he doesn't hear something them this afternoon.Daniel Lam

## 2016-08-24 NOTE — Telephone Encounter (Signed)
Patient requested copy of lab work, has not yet received mailed results.  Copies provided at patient request.

## 2016-08-27 ENCOUNTER — Other Ambulatory Visit: Payer: 59

## 2016-08-27 NOTE — Telephone Encounter (Signed)
error 

## 2016-09-08 ENCOUNTER — Telehealth: Payer: Self-pay | Admitting: Neurology

## 2016-09-08 NOTE — Telephone Encounter (Signed)
I called left message.  His MRI is scheduled for Dec 1, has followup appt on Nov 30, please try to contact him again, may schedule follow up after MRIs.

## 2016-09-08 NOTE — Telephone Encounter (Signed)
Spoke to Dr. Larose Kells - he has already called and changed his appt to 11/04/16.  He would like to keep this pending time.  I invited him to call back, if he would like to be seen sooner.  Says he is feeling "great".

## 2016-09-09 ENCOUNTER — Ambulatory Visit: Payer: 59 | Admitting: Neurology

## 2016-09-10 ENCOUNTER — Ambulatory Visit
Admission: RE | Admit: 2016-09-10 | Discharge: 2016-09-10 | Disposition: A | Discharge status: Home / Self Care | Payer: 59 | Source: Ambulatory Visit | Attending: Neurology | Admitting: Neurology

## 2016-09-10 DIAGNOSIS — B009 Herpesviral infection, unspecified: Secondary | ICD-10-CM

## 2016-09-10 DIAGNOSIS — R202 Paresthesia of skin: Secondary | ICD-10-CM

## 2016-09-10 DIAGNOSIS — M50221 Other cervical disc displacement at C4-C5 level: Secondary | ICD-10-CM | POA: Diagnosis not present

## 2016-09-10 DIAGNOSIS — M5126 Other intervertebral disc displacement, lumbar region: Secondary | ICD-10-CM | POA: Diagnosis not present

## 2016-09-10 MED ORDER — GADOBENATE DIMEGLUMINE 529 MG/ML IV SOLN
20.0000 mL | Freq: Once | INTRAVENOUS | Status: AC | PRN
  Administered 2016-09-10: 20 mL via INTRAVENOUS

## 2016-09-23 ENCOUNTER — Other Ambulatory Visit: Payer: Self-pay | Admitting: Family Medicine

## 2016-09-23 MED ORDER — LIDOCAINE 5 % EX OINT: 1.0000 "application " | TOPICAL_OINTMENT | CUTANEOUS | 0 refills | Status: DC | PRN

## 2016-09-23 MED FILL — LIDOCAINE 5% OINTMENT: 5 | 15 days supply | Qty: 30 | Fill #0

## 2016-09-23 MED FILL — FLUoxetine HCL 20 MG CAPS: 20 | 90 days supply | Qty: 90 | Fill #2

## 2016-09-23 NOTE — Addendum Note (Signed)
Addended by: Sharen Counter D on: 09/23/2016 03:30 PM   Modules accepted: Orders

## 2016-09-24 ENCOUNTER — Encounter: Payer: Self-pay | Admitting: Neurology

## 2016-10-21 ENCOUNTER — Encounter: Payer: Self-pay | Admitting: Neurology

## 2016-10-27 ENCOUNTER — Telehealth: Payer: Self-pay | Admitting: *Deleted

## 2016-10-27 NOTE — Telephone Encounter (Signed)
Left message on Dr. Ethel Rana home and mobile numbers to let him know our office will be closed on 10/28/16 due to snow.  Requested him to return my call so that his appt can be rescheduled, at a convenient time, with Dr. Krista Blue.

## 2016-10-28 ENCOUNTER — Ambulatory Visit: Payer: 59 | Admitting: Neurology

## 2016-11-04 ENCOUNTER — Ambulatory Visit: Payer: 59 | Admitting: Neurology

## 2016-11-26 ENCOUNTER — Other Ambulatory Visit: Payer: Self-pay | Admitting: Family Medicine

## 2016-11-26 MED ORDER — FLUTICASONE PROPIONATE 0.005 % EX OINT: 1.0000 "application " | TOPICAL_OINTMENT | Freq: Two times a day (BID) | CUTANEOUS | 5 refills | Status: DC

## 2016-11-26 MED FILL — VENTOLIN HFA 90 MCG INHALER: 108 (90 BAS | 30 days supply | Qty: 18 | Fill #1

## 2016-11-26 MED FILL — FLUTICASONE PROP 0.005% OIN: 0.005 | 15 days supply | Qty: 30 | Fill #0

## 2016-12-24 MED FILL — FLUoxetine HCL 20 MG CAPS: 20 | 90 days supply | Qty: 90 | Fill #3

## 2017-01-20 ENCOUNTER — Ambulatory Visit (INDEPENDENT_AMBULATORY_CARE_PROVIDER_SITE_OTHER): Payer: 59 | Admitting: Neurology

## 2017-01-20 ENCOUNTER — Encounter: Payer: Self-pay | Admitting: Neurology

## 2017-01-20 VITALS — BP 110/62 | HR 64 | Ht 70.0 in | Wt 217.0 lb

## 2017-01-20 DIAGNOSIS — B009 Herpesviral infection, unspecified: Secondary | ICD-10-CM

## 2017-01-20 DIAGNOSIS — M5416 Radiculopathy, lumbar region: Secondary | ICD-10-CM | POA: Diagnosis not present

## 2017-01-20 NOTE — Progress Notes (Signed)
PATIENT: Daniel Lam DOB: Mar 18, 1961  Chief Complaint  Patient presents with  . Numbness    Reports improvement in symptoms. He has recently had to restart Valtrex 1000mg  daily.     [place] is a 56 years old right-handed male, seen in refer by his primary care physician Dr. Cheri Rous for evaluation of recurrent bilateral lower extremity paresthesia, initially evaluation was on August 19 2016.    He was seen by Dr. love in the past for similar complaints, I was able to review and summarize Dr. Tressia Danas note in 2011, he had a history of genital herpes simplex virus type II since age 40, treated with valtrex 1 g twice daily for 3 days, on average every 6 months for recurrent symptoms. Symptoms was usually consisted of burning along his inner thigh, or buttock region, initially was on either left or right, if he did not take Valtrex, blister will appear, intermittent dosing of Valtrex has been a successful therapy until 2010.  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  Since that time he had increase in the frequency of HSV-2 outbreaks, as often as every 2 months per record, he had one episode in March 2011, he noticed burning at bilateral inner thigh extending to his knee level even to his ankles without rash. Paresthesia also involving bilateral buttocks, lower abdomen pelvic region, he noticed blisters at bilateral lower abdominal wall, up to periumbilical region, he denied bowel and bladder incontinence. He was diagnosed with myelitis, herpes simplex type II, was treated with his usual higher dose of Valtrex for a short course with significant improvement.  However, since that episode in March 2011, he noticed that if he continues to down Valtrex, such as stop it totally, he would get bilateral lower extremity  paresthesia, so over the past almost 6 years, he has been taking daily Valtrex 500 mg daily, every few months, he would have recurrent bilateral lower extremity paresthesia, but no longer noticed any significant blisters, he would be treated with higher dose of Valtrex 500mg  2 tabs bid for 1 week, his symptoms would quickly improved.  Since 2017, he had more frequent similar recurrence, also occasionally bilateral fingertips paresthesia  He continued to be active, hiking without any difficulty, no bowel and bladder incontinence. But he does not like the idea of staying on antivirals treatment, and recent frequent recurrence. He hoped to rule out other etiology that potentially account for his recurrent symptoms.   UPDATE January 20 2017: We have personally reviewed MRI lumbar spine on September 10 2016 with without contrast, there are evidence of multilevel mild degenerative disc disease, but there was no evidence of significant foraminal canal stenosis, but after IV contrast there is enhancement along the L3 nerve roots, which could represent a radicular pain versus enhanced lower lumbar nerve roots, MRI of cervical spine showed evidence of mild degenerative disc disease, thickened posterior longitudinal ligament, but no significant canal or foraminal stenosis.  He temporarily stopped his daily dose of Valtrex for a while, then developed genital herpes, and a few days later, he had significant peri-saddle area paresthesia, he has to take higher dose of Valtrex 1000 mg twice a day for about a week, which did help his symptoms,  In addition he reported that he likes to mountain biking 7-8 miles each time, afterwards,  get bilateral distal feet paresthesia, lasting for few hours to days, and there is no significant limitation on his daily activity, he denies significant low back pain, no bowel bladder incontinence.  Today on examination, he was noted to have mild left first toe extension/flexion weakness, he  did have a history of left testicular cancer, following his left orchiectomy, he had left periaortic lymph node radiation, since then, he had this frequent HSV-2 virus infection, associated paresthesia. But intermittent bilateral lower extremity paresthesia seems to be related to his biking activity.  We have reviewed laboratory evaluation, normal and active B12, copper, HIV, RPR, CMP, angiotensin-converting enzyme, Lyme was then brought showed positive IgG P 66, P 41, P 23, not enough evidence to make a diagnosis of chronic Lyme infection   REVIEW OF SYSTEMS: Full 14 system review of systems performed and notable only for As above   ALLERGIES: Allergies  Allergen Reactions  . Demerol [Meperidine Hcl]     Difficulty voiding  . Latex     REACTION: SOB, rash, itching  . Morphine And Related     Difficulty voiding    HOME MEDICATIONS: Current Outpatient Prescriptions  Medication Sig Dispense Refill  . acetaminophen (TYLENOL) 500 MG tablet Take 500 mg by mouth every 6 (six) hours as needed.    . ALPRAZolam (XANAX) 0.5 MG tablet Take 0.5 mg by mouth at bedtime as needed.      . diltiazem 2 % GEL Apply 1 application topically 2 (two) times daily. 30 g 0  . FLUoxetine (PROZAC) 20 MG capsule Take 20 mg by mouth daily.      . fluticasone (CUTIVATE) 0.005 % ointment Apply 1 application topically 2 (two) times daily. (Patient taking differently: Apply 1 application topically as needed. ) 30 g 5  . ibuprofen (ADVIL,MOTRIN) 200 MG tablet Take 200 mg by mouth every 6 (six) hours as needed.    . lidocaine (XYLOCAINE) 5 % ointment Apply 1 application topically as needed. 30 g 0  . Minoxidil (ROGAINE MENS EX) Apply topically 2 (two) times daily.    . NONFORMULARY OR COMPOUNDED ITEM RESMED Standard Air Tubing for CPAP 1 each 11  . NONFORMULARY OR COMPOUNDED ITEM ResMed Swift FX nasal pillows mask (Medium) 1 each 11  . valACYclovir (VALTREX) 1000 MG tablet Take 1 tablet (1,000 mg total) by mouth daily.  90 tablet 3  . Vitamin D, Ergocalciferol, (DRISDOL) 50000 units CAPS capsule Take 1 capsule (50,000 Units total) by mouth every 7 (seven) days. 4 capsule 2  . albuterol (PROVENTIL HFA;VENTOLIN HFA) 108 (90 Base) MCG/ACT inhaler Inhale 2 puffs into the lungs 4 (four) times daily. 1 Inhaler 11   No current facility-administered medications for this visit.     PAST MEDICAL HISTORY: Past Medical History:  Diagnosis Date  . Anxiety state, unspecified   . Atypical mole 2008  . Complication of anesthesia    had some difficulty voiding  . Dermatitis    anal dermatitis  . Hemorrhoids   . Herpes simplex without mention of complication   . 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 Numbness and tingling   . Other and unspecified anterior pituitary hyperfunction   . Sleep apnea   . Unspecified asthma(493.90)    controlled    PAST SURGICAL HISTORY: Past Surgical History:  Procedure Laterality Date  . APPENDECTOMY    . KNEE ARTHROSCOPY  09/01/2011   Procedure: ARTHROSCOPY KNEE;  Surgeon: Alta Corning;  Location:  [place];  Service: Orthopedics;  Laterality: Left;  medial and lateral meniscus tear debridment  and chondroplasty  . LASIK    . ORCHIECTOMY     Lt. 08/2009    FAMILY HISTORY: Family History  Problem Relation Age of Onset  . Sjogren's syndrome Mother   . COPD Mother   . Colon polyps Father   . Dementia Father   . Throat cancer Father   . Melanoma Brother   . Throat cancer      Larynx  . Colon cancer Neg Hx   . Esophageal cancer Neg Hx   . Stomach cancer Neg Hx   . Rectal cancer Neg Hx     SOCIAL HISTORY:  Social History   Social History  . Marital status: Married    Spouse name: N/A  . Number of children: 3  . Years of education: MD   Occupational History  . physician     internal medicine   Social History Main Topics  . Smoking status: Current Some Day Smoker    Packs/day: 0.25    Types:  Cigarettes, Cigars  . Smokeless tobacco: Never Used     Comment: Occasional cigar  . Alcohol use 0.0 oz/week     Comment: occ  . Drug use: No  . Sexual activity: Yes    Partners: Female   Other Topics Concern  . Not on file   Social History Narrative   Regular exercise: yes, very active   Caffeine use: 2 cups of coffee and soda's daily.   Right-handed.        PHYSICAL EXAM   Vitals:   01/20/17 1444  BP: 110/62  Pulse: 64  Weight: 217 lb (98.4 kg)  Height: 5\' 10"  (1.778 m)    Not recorded      Body mass index is 31.14 kg/m.  PHYSICAL EXAMNIATION:  Gen: NAD, conversant, well nourised, obese, well groomed                     Cardiovascular: Regular rate rhythm, no peripheral edema, warm, nontender. Eyes: Conjunctivae clear without exudates or hemorrhage Neck: Supple, no carotid bruits. Pulmonary: Clear to auscultation bilaterally   NEUROLOGICAL EXAM:  MENTAL STATUS: Speech:    Speech is normal; fluent and spontaneous with normal comprehension.  Cognition:     Orientation to time, place and person     Normal recent and remote memory     Normal Attention span and concentration     Normal Language, naming, repeating,spontaneous speech     Fund of knowledge   CRANIAL NERVES: CN II: Visual fields are full to confrontation. Fundoscopic exam is normal with sharp discs and no vascular changes. Pupils are round equal and briskly reactive to light. CN III, IV, VI: extraocular movement are normal. No ptosis. CN V: Facial sensation is intact to pinprick in all 3 divisions bilaterally. Corneal responses are intact.  CN VII: Face is symmetric with normal eye closure and smile. CN VIII: Hearing is normal to rubbing fingers CN IX, X: Palate elevates symmetrically. Phonation is normal. CN XI: Head turning and shoulder shrug are intact CN XII: Tongue is midline with normal movements and no atrophy.  MOTOR: There is no pronator drift of out-stretched arms. Muscle bulk and  tone are normal. Muscle strength is normal. With exception of mild left toe extension flexion weakness  REFLEXES: Reflexes are 2+ and symmetric at the biceps, triceps, knees, and ankles. Plantar responses are flexor.  SENSORY:  Intact to light touch, pinprick, positional sensation and vibratory sensation are intact in fingers and toes.  COORDINATION: Rapid alternating movements and fine finger movements are intact. There is no dysmetria on finger-to-nose and heel-knee-shin.    GAIT/STANCE: Posture is normal. Gait is steady with normal steps, base, arm swing, and turning. Heel and toe walking are normal. Tandem gait is normal.  Romberg is absent.   DIAGNOSTIC DATA (LABS, IMAGING, TESTING) - I reviewed patient records, labs, notes, testing and imaging myself where available.   ASSESSMENT AND PLAN  SABER DICKERMAN is a 56 y.o. male    History of recurrent genital herpes simplex type II  MRI of lumbar showed possible lower lumbar root enhancement  I have suggested may consider ID consultation if he has worsening symptoms,  Also intermittent bilateral upper extremity and distal feet paresthesia  Which is most likely related to his biking activity,  There is no evidence of large fiber peripheral neuropathy, left lumbosacral radiculopathy.   Marcial Pacas, M.D. Ph.D.  [facility] [address], [place], [place] [address] Ph: [phone] Fax: [phone]  CC: Referring Provider

## 2017-02-17 DIAGNOSIS — F411 Generalized anxiety disorder: Secondary | ICD-10-CM | POA: Diagnosis not present

## 2017-03-14 ENCOUNTER — Other Ambulatory Visit: Payer: Self-pay | Admitting: Family Medicine

## 2017-03-14 MED FILL — FLUTICASONE PROP 0.005% OIN: 0.005 | 15 days supply | Qty: 30 | Fill #0

## 2017-03-14 MED FILL — VENTOLIN HFA 90 MCG INHALER: 108 (90 BAS | 30 days supply | Qty: 18 | Fill #0

## 2017-03-14 MED FILL — valACYclovir HCL 1 GM TABS: 1 | 90 days supply | Qty: 90 | Fill #0

## 2017-03-15 ENCOUNTER — Telehealth: Payer: Self-pay

## 2017-03-15 DIAGNOSIS — G4733 Obstructive sleep apnea (adult) (pediatric): Secondary | ICD-10-CM | POA: Diagnosis not present

## 2017-03-15 NOTE — Telephone Encounter (Signed)
PA initiated via Covermymeds; KEY: [id number]. Awaiting determination.

## 2017-03-17 ENCOUNTER — Other Ambulatory Visit: Payer: Self-pay | Admitting: Family Medicine

## 2017-03-17 DIAGNOSIS — M5416 Radiculopathy, lumbar region: Secondary | ICD-10-CM

## 2017-03-17 MED ORDER — LIDOCAINE HCL 3 % EX CREA: 1.0000 "application " | TOPICAL_CREAM | CUTANEOUS | 3 refills | Status: DC | PRN

## 2017-03-17 MED FILL — ALPRAZolam 0.5 MG TABS: 0.5 | 90 days supply | Qty: 270 | Fill #0

## 2017-03-17 MED FILL — LIDOCAINE 3% CREAM: 3 | 30 days supply | Qty: 85 | Fill #0

## 2017-03-17 NOTE — Telephone Encounter (Signed)
Received PA denial for lidocaine 5 %. Pt must have trial of lidocaine 3 %, Rx sent to [facility] by Dr. Etter Sjogren. Pt informed or can use OTC Salonpas 4 %.

## 2017-03-22 MED FILL — TRINTELLIX 10 MG TABLET: 10 | 90 days supply | Qty: 90 | Fill #0

## 2017-05-25 MED FILL — FLUoxetine HCL 20 MG CAPS: 20 | 90 days supply | Qty: 90 | Fill #0

## 2017-07-13 DIAGNOSIS — Z8547 Personal history of malignant neoplasm of testis: Secondary | ICD-10-CM | POA: Diagnosis not present

## 2017-07-13 DIAGNOSIS — E349 Endocrine disorder, unspecified: Secondary | ICD-10-CM | POA: Diagnosis not present

## 2017-08-02 ENCOUNTER — Other Ambulatory Visit: Payer: Self-pay | Admitting: Family Medicine

## 2017-08-02 ENCOUNTER — Other Ambulatory Visit: Payer: Self-pay

## 2017-08-02 DIAGNOSIS — L509 Urticaria, unspecified: Secondary | ICD-10-CM

## 2017-08-17 MED FILL — FLUTICASONE PROP 0.005% OIN: 0.005 | 15 days supply | Qty: 30 | Fill #1

## 2017-08-17 MED FILL — ALPRAZolam 0.5 MG TABS: 0.5 | 90 days supply | Qty: 270 | Fill #1

## 2017-08-17 MED FILL — FLUoxetine HCL 20 MG CAPS: 20 | 90 days supply | Qty: 90 | Fill #1

## 2017-08-17 MED FILL — valACYclovir HCL 1 GM TABS: 1 | 90 days supply | Qty: 90 | Fill #1

## 2017-08-17 NOTE — Progress Notes (Signed)
[facility] [name], [place] [address] Phone: [phone] Subjective:    I'm seeing this patient by the request  of:    CC: Knee pain   [id number]  Daniel Lam is a 56 y.o. male coming in with complaint of left knee pain. He was playing tennis when he hurt his knee.   Onset- 4 weeks ago  Location- inside and in the back Duration- Throughohut the day Character- Sore Aggravating factors- Playing tennis and sitting for long periods of time then getting up, hiking, tennis  Reliving factors-  Ibuprofen Therapies tried- advil avoid sport Severity- 4      Past Medical History:  Diagnosis Date  . Anxiety state, unspecified   . Atypical mole 2008  . Complication of anesthesia    had some difficulty voiding  . Dermatitis    anal dermatitis  . Hemorrhoids   . Herpes simplex without mention of complication   . 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 Numbness and tingling   . Other and unspecified anterior pituitary hyperfunction   . Sleep apnea   . Unspecified asthma(493.90)    controlled   Past Surgical History:  Procedure Laterality Date  . APPENDECTOMY    . LASIK    . ORCHIECTOMY     Lt. 08/2009   Social History   Socioeconomic History  . Marital status: Married    Spouse name: Not on file  . Number of children: 3  . Years of education: MD  . Highest education level: Not on file  Social Needs  . Financial resource strain: Not on file  . Food insecurity - worry: Not on file  . Food insecurity - inability: Not on file  . Transportation needs - medical: Not on file  . Transportation needs - non-medical: Not on file  Occupational History  . Occupation: physician    Comment: internal medicine  Tobacco Use  . Smoking status: Current Some Day Smoker    Packs/day: 0.25    Types: Cigarettes, Cigars  . Smokeless tobacco: Never Used  . Tobacco comment: Occasional cigar    Substance and Sexual Activity  . Alcohol use: Yes    Alcohol/week: 0.0 oz    Comment: occ  . Drug use: No  . Sexual activity: Yes    Partners: Female  Other Topics Concern  . Not on file  Social History Narrative   Regular exercise: yes, very active   Caffeine use: 2 cups of coffee and soda's daily.   Right-handed.   Allergies  Allergen Reactions  . Demerol [Meperidine Hcl]     Difficulty voiding  . Latex     REACTION: SOB, rash, itching  . Morphine And Related     Difficulty voiding   Family History  Problem Relation Age of Onset  . Sjogren's syndrome Mother   . COPD Mother   . Colon polyps Father   . Dementia Father   . Throat cancer Father   . Melanoma Brother   . Throat cancer Unknown        Larynx  . Colon cancer Neg Hx   . Esophageal cancer Neg Hx   . Stomach cancer Neg Hx   . Rectal cancer Neg Hx      Past medical history, social, surgical and family history all reviewed in electronic medical record.  No pertanent information unless stated regarding to the chief complaint.   Review of  Systems:Review of systems updated and as accurate as of 08/18/17  No headache, visual changes, nausea, vomiting, diarrhea, constipation, dizziness, abdominal pain, skin rash, fevers, chills, night sweats, weight loss, swollen lymph nodes, body aches, joint swelling, muscle aches, chest pain, shortness of breath, mood changes.   Objective  Blood pressure 104/70, pulse 69, height 5\' 10"  (1.778 m), weight 221 lb (100.2 kg), SpO2 95 %. Systems examined below as of 08/18/17   General: No apparent distress alert and oriented x3 mood and affect normal, dressed appropriately.  HEENT: Pupils equal, extraocular movements intact  Respiratory: Patient's speak in full sentences and does not appear short of breath  Cardiovascular: No lower extremity edema, non tender, no erythema  Skin: Warm dry intact with no signs of infection or rash on extremities or on axial skeleton.  Abdomen: Soft  nontender  Neuro: Cranial nerves II through XII are intact, neurovascularly intact in all extremities with 2+ DTRs and 2+ pulses.  Lymph: No lymphadenopathy of posterior or anterior cervical chain or axillae bilaterally.  Gait normal with good balance and coordination.  MSK:  Non tender with full range of motion and good stability and symmetric strength and tone of shoulders, elbows, wrist, hip and ankles bilaterally.  Knee:left  Normal to inspection with no erythema or effusion or obvious bony abnormalities. Palpation normal with no warmth, joint line tenderness, patellar tenderness, or condyle tenderness. ROM full in flexion and extension and lower leg rotation. Positive laxity of LCL but does have end point.  Positive Mcmurray's, Apley's, and Thessalonian tests. Non painful patellar compression. Patellar glide without crepitus. Patellar and quadriceps tendons unremarkable. Hamstring and quadriceps strength is normal. Conlateral knee unremarkable  MSK US performed of: left  This study was ordered, performed, and interpreted by Charlann Boxer D.O.  Knee: Left knee show the patient's LCL does have a partial tear noted.  Approximately 25% of the tendon is involved more on the femoral aspect.  Hypoechoic changes and increasing Doppler flow.  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      Note: This dictation was prepared with Dragon dictation along with smaller phrase technology. Any transcriptional errors that result from this process are unintentional.

## 2017-08-18 ENCOUNTER — Ambulatory Visit: Payer: Self-pay

## 2017-08-18 ENCOUNTER — Ambulatory Visit (INDEPENDENT_AMBULATORY_CARE_PROVIDER_SITE_OTHER): Payer: 59 | Admitting: Family Medicine

## 2017-08-18 ENCOUNTER — Encounter: Payer: Self-pay | Admitting: Family Medicine

## 2017-08-18 VITALS — BP 104/70 | HR 69 | Ht 70.0 in | Wt 221.0 lb

## 2017-08-18 DIAGNOSIS — G8929 Other chronic pain: Secondary | ICD-10-CM | POA: Diagnosis not present

## 2017-08-18 DIAGNOSIS — S83422A Sprain of lateral collateral ligament of left knee, initial encounter: Secondary | ICD-10-CM | POA: Diagnosis not present

## 2017-08-18 DIAGNOSIS — M25561 Pain in right knee: Secondary | ICD-10-CM

## 2017-08-18 NOTE — Assessment & Plan Note (Signed)
Partial tear.  Given bracing, home exercise, which activities to do which wants to avoid.  We discussed icing regimen, patient will avoid any twisting motions.  Follow-up again in 4 weeks.

## 2017-08-29 MED FILL — LIDOCAINE 3% CREAM: 3 | 30 days supply | Qty: 85 | Fill #1

## 2017-08-29 MED FILL — VENTOLIN HFA 90 MCG INHALER: 108 (90 BAS | 30 days supply | Qty: 18 | Fill #1

## 2017-08-30 ENCOUNTER — Ambulatory Visit: Payer: 59 | Admitting: Allergy and Immunology

## 2017-09-06 ENCOUNTER — Ambulatory Visit: Payer: 59 | Admitting: Allergy and Immunology

## 2017-09-06 ENCOUNTER — Encounter: Payer: Self-pay | Admitting: Allergy and Immunology

## 2017-09-06 VITALS — BP 110/78 | HR 69 | Temp 98.1°F | Resp 18 | Ht 70.0 in | Wt 221.6 lb

## 2017-09-06 DIAGNOSIS — J4540 Moderate persistent asthma, uncomplicated: Secondary | ICD-10-CM

## 2017-09-06 DIAGNOSIS — J3089 Other allergic rhinitis: Secondary | ICD-10-CM | POA: Diagnosis not present

## 2017-09-06 DIAGNOSIS — L501 Idiopathic urticaria: Secondary | ICD-10-CM | POA: Diagnosis not present

## 2017-09-06 MED ORDER — MONTELUKAST SODIUM 10 MG PO TABS: 10.0000 mg | ORAL_TABLET | Freq: Every day | ORAL | 5 refills | Status: DC

## 2017-09-06 MED ORDER — EPINEPHRINE 0.3 MG/0.3ML IJ SOAJ: 0.3000 mg | Freq: Once | INTRAMUSCULAR | 3 refills | Status: DC

## 2017-09-06 MED FILL — MONTELUKAST SOD 10 MG TAB: 10 | 30 days supply | Qty: 30 | Fill #0

## 2017-09-06 NOTE — Progress Notes (Signed)
Dear Dr. Carollee Herter,  Thank you for referring Colon Branch to the [facility] on 09/06/2017.   Below is a summation of this patient's evaluation and recommendations.  Thank you for your referral. I will keep you informed about this patient's response to treatment.   If you have any questions please do not hesitate to contact me.   Sincerely,  Jiles Prows, MD Allergy / Immunology [facility]   ______________________________________________________________________    NEW PATIENT NOTE  Referring Provider: Ann Held, * Primary Provider: Carollee Herter, Alferd Apa, DO Date of office visit: 09/06/2017    Subjective:   Chief Complaint:  Daniel Lam (DOB: September 16, 1961) is a 56 y.o. male who presents to the clinic on 09/06/2017 with a chief complaint of Urticaria .     HPI: [name] presents to this clinic in evaluation of urticaria.  His history dates back to May 2018 at which point in time he started to develop a pruritic disorder involving his legs believed to be secondary to dirt exposure while mountain biking.  He took some prednisone and this ended up becoming less of an issue within several days.  However, since that event he has been having recurrent pruritic episodes and has had global urticaria that has occurred on 4 different occasions.  His initial bout of global urticaria occurred while in [place] in June 2018 for which he took prednisone and then he has had at least 3 other additional episodes of global urticaria usually occurring at nighttime for which he has taken a single dose of prednisone with several of those episodes.  His lesions never healed with scar or hyperpigmentation.  In between episodes he does not have any urticaria but continues with a pruritic disorder.  There is no associated systemic or constitutional symptoms with this issue.  He does not have associated respiratory  or GI symptoms.  There is no obvious provoking factor giving rise to this issue.  He has not really had a significant environmental change other than the fact that he started topical Rogaine for hair loss in March 2018.  In addition, as noted above, he did visit [place] in June 2018.  He has a history of atopic disease for which I have seen him in this clinic approximately 10 years ago which is under very good control with allergen avoidance measures at this point.  He also appears to have intermittent asthma which flared up in May 2018 after sustaining a viral respiratory tract infection and he has been using Symbicort 1 time per day since that point and rarely uses a short acting bronchodilator except for prior to exercise.  He does smoke 1-2 cigarettes several times per week for stress reduction.  In 2017 he apparently had paresthesias and had evaluation with blood tests identifying no significant abnormality.  His Lyme test was equivocal and upon consultation with infectious disease it was determined that he did not have Lyme's disease.  Past Medical History:  Diagnosis Date  . Anxiety state, unspecified   . Atypical mole 2008  . Complication of anesthesia    had some difficulty voiding  . Dermatitis    anal dermatitis  . Hemorrhoids   . Herpes simplex without mention of complication   . 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 Numbness and tingling   . Other and unspecified anterior  pituitary hyperfunction   . Sleep apnea   . Unspecified asthma(493.90)    controlled    Past Surgical History:  Procedure Laterality Date  . APPENDECTOMY    . KNEE ARTHROSCOPY  09/01/2011   Procedure: ARTHROSCOPY KNEE;  Surgeon: Alta Corning;  Location: [facility];  Service: Orthopedics;  Laterality: Left;  medial and lateral meniscus tear debridment  and chondroplasty  . LASIK    . ORCHIECTOMY     Lt. 08/2009    Allergies as of 09/06/2017       Reactions   Demerol [meperidine Hcl]    Difficulty voiding   Latex    REACTION: SOB, rash, itching   Morphine And Related    Difficulty voiding      Medication List      acetaminophen 500 MG tablet Commonly known as:  TYLENOL Take 500 mg by mouth every 6 (six) hours as needed.   ALPRAZolam 0.5 MG tablet Commonly known as:  XANAX Take 0.5 mg by mouth at bedtime as needed.   diltiazem 2 % Gel Apply 1 application topically 2 (two) times daily.   FLUoxetine 20 MG capsule Commonly known as:  PROZAC Take 20 mg by mouth daily.   fluticasone 0.005 % ointment Commonly known as:  CUTIVATE Apply 1 application topically 2 (two) times daily.   ibuprofen 200 MG tablet Commonly known as:  ADVIL,MOTRIN Take 200 mg by mouth every 6 (six) hours as needed.   lidocaine 3 % Crea cream Commonly known as:  LINDAMANTLE Apply 1 application topically as needed.   lidocaine 5 % ointment Commonly known as:  XYLOCAINE Apply 1 application topically as needed.   NONFORMULARY OR COMPOUNDED ITEM RESMED Standard Air Tubing for CPAP   NONFORMULARY OR COMPOUNDED ITEM ResMed Swift FX nasal pillows mask (Medium)   ROGAINE MENS EX Apply topically 2 (two) times daily.   valACYclovir 1000 MG tablet Commonly known as:  VALTREX TAKE 1 TABLET (1,000 MG TOTAL) BY MOUTH DAILY.   VENTOLIN HFA 108 (90 Base) MCG/ACT inhaler Generic drug:  albuterol INHALE 2 PUFFS BY MOUTH INTO THE LUNGS 4 (FOUR) TIMES DAILY.   Vitamin D (Ergocalciferol) 50000 units Caps capsule Commonly known as:  DRISDOL Take 1 capsule (50,000 Units total) by mouth every 7 (seven) days.       Review of systems negative except as noted in HPI / PMHx or noted below:  Review of Systems  Constitutional: Negative.   HENT: Negative.   Eyes: Negative.   Respiratory: Negative.   Cardiovascular: Negative.   Gastrointestinal: Negative.   Genitourinary: Negative.   Musculoskeletal: Negative.   Skin: Negative.     Neurological: Negative.   Endo/Heme/Allergies: Negative.   Psychiatric/Behavioral: Negative.     Family History  Problem Relation Age of Onset  . Sjogren's syndrome Mother   . COPD Mother   . Colon polyps Father   . Dementia Father   . Throat cancer Father   . Melanoma Brother   . Throat cancer Unknown        Larynx  . Colon cancer Neg Hx   . Esophageal cancer Neg Hx   . Stomach cancer Neg Hx   . Rectal cancer Neg Hx     Social History   Socioeconomic History  . Marital status: Married    Spouse name: Not on file  . Number of children: 3  . Years of education: MD  . Highest education level: Not on file  Social Needs  . Financial  resource strain: Not on file  . Food insecurity - worry: Not on file  . Food insecurity - inability: Not on file  . Transportation needs - medical: Not on file  . Transportation needs - non-medical: Not on file  Occupational History  . Occupation: physician    Comment: internal medicine  Tobacco Use  . Smoking status: Current Some Day Smoker    Packs/day: 0.25    Types: Cigarettes, Cigars  . Smokeless tobacco: Never Used  . Tobacco comment: Occasional cigar  Substance and Sexual Activity  . Alcohol use: Yes    Alcohol/week: 0.0 oz    Comment: occ  . Drug use: No  . Sexual activity: Yes    Partners: Female  Other Topics Concern  . Not on file  Social History Narrative   Regular exercise: yes, very active   Caffeine use: 2 cups of coffee and soda's daily.   Right-handed.    Environmental and Social history  Lives in a house with a dry environment, 2 dogs and 2 [place] pigs located inside the household, carpet in the bedroom, no plastic on the bed, no plastic on the pillow, intermittently smoking tobacco products.  He is a physician.  Objective:   Vitals:   09/06/17 1431  BP: 110/78  Pulse: 69  Resp: 18  Temp: 98.1 F (36.7 C)  SpO2: 95%   Height: 5\' 10"  (177.8 cm) Weight: 221 lb 9.6 oz (100.5 kg)  Physical Exam   Constitutional: He is well-developed, well-nourished, and in no distress.  HENT:  Head: Normocephalic. Head is without right periorbital erythema and without left periorbital erythema.  Right Ear: Tympanic membrane, external ear and ear canal normal.  Left Ear: Tympanic membrane, external ear and ear canal normal.  Nose: Nose normal. No mucosal edema or rhinorrhea.  Mouth/Throat: Oropharynx is clear and moist and mucous membranes are normal. No oropharyngeal exudate.  Eyes: Conjunctivae and lids are normal. Pupils are equal, round, and reactive to light.  Neck: Trachea normal. No tracheal deviation present. No thyromegaly present.  Cardiovascular: Normal rate, regular rhythm, S1 normal, S2 normal and normal heart sounds.  No murmur heard. Pulmonary/Chest: Effort normal. No stridor. No tachypnea. No respiratory distress. He has no wheezes. He has no rales. He exhibits no tenderness.  Abdominal: Soft. He exhibits no distension and no mass. There is no hepatosplenomegaly. There is no tenderness. There is no rebound and no guarding.  Musculoskeletal: He exhibits no edema or tenderness.  Lymphadenopathy:       Head (right side): No tonsillar adenopathy present.       Head (left side): No tonsillar adenopathy present.    He has no cervical adenopathy.    He has no axillary adenopathy.  Neurological: He is alert. Gait normal.  Skin: No rash (1 cm diameter erythematous nontender nodule epigastric region) noted. He is not diaphoretic. No erythema. No pallor. Nails show no clubbing.  Psychiatric: Mood and affect normal.    Diagnostics: Allergy skin tests were performed.  He demonstrated hypersensitivity to shellfish members.  Review blood tests obtained 17 August 2016 identified normal hepatic and renal function, white blood cell count 4.2, hemoglobin 15.1, absolute eosinophil 200, absolute lymphocyte 2800, absolute platelet 208.  Assessment and Plan:    1. Idiopathic urticaria   2. Asthma,  moderate persistent, well-controlled   3. Other allergic rhinitis     1.  Allergen avoidance measures?  2.  Blood - CBC w/diff, CMP, TSH, T4, TP, alpha gal panel  3.  Use a combination of xyzal 2.5 - 5.0 mg plus montelukast 10 mg daily  4. Auvi-Q 0.3, benadryl, MD/ER evaluation for allergic reaction  5.  Continue Symbicort and albuterol MDI  6.  Always consider a nicotine replacement for tobacco smoke exposure  7.  Eosinophilia?  Ivermectin treatment?  8.  Xolair?  9.  Rogaine?  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  The cause of his pruritic disorder and intermittent urticaria is unknown and we will investigate for a systemic disease and alpha gal syndrome with the blood tests noted above and I made the recommendation that he consider an antihistamine and a leukotriene modifier on a regular basis in a preventative mode.  Given the fact that his initial episode of urticaria occurred in [place] it is quite possible that he is infected with a parasite and if he does have documented eosinophilia then it would be worthwhile to empirically treat him with ivermectin to clear out any parasite load.  He would be a candidate for Xolair administration if he continues to have problems and we cannot find the etiologic factor responsible for his immunological hyperreactivity.  I also had a discussion with him today about the fact that Rogaine is a new medication that has been introduced this spring and may be a possible trigger for his immunological hyperreactivity and he may need to consider discontinuing this agent in the future.  I am not going to address his asthma today other than the fact that I did have a discussion with him about the need to avoid tobacco smoke exposure and find a nicotine replacement to deal with his intermittent nicotine requirement.  I will contact him with the results of  his blood tests once they are available for review.  Jiles Prows, MD Allergy / Immunology [facility]

## 2017-09-06 NOTE — Patient Instructions (Addendum)
  1.  Allergen avoidance measures?  2.  Blood - CBC w/diff, CMP, TSH, T4, TP, alpha gal panel  3.  Use a combination of xyzal 2.5 - 5.0 plus montelukast 10 mg daily  4. Auvi-Q 0.3, benadryl, MD/ER evaluation for allergic reaction  5.  Continue Symbicort and albuterol MDI  6.  Always consider a nicotine replacement for tobacco smoke exposure  7.  Eosinophilia?  Ivermectin treatment?  8.  Xolair?  9.  Rogaine?

## 2017-09-07 ENCOUNTER — Encounter: Payer: Self-pay | Admitting: Allergy and Immunology

## 2017-09-09 LAB — CBC WITH DIFFERENTIAL/PLATELET
BASOS: 0 %
Basophils Absolute: 0 10*3/uL (ref 0.0–0.2)
EOS (ABSOLUTE): 0.2 10*3/uL (ref 0.0–0.4)
Eos: 4 %
HEMATOCRIT: 40 % (ref 37.5–51.0)
Hemoglobin: 13.9 g/dL (ref 13.0–17.7)
IMMATURE GRANULOCYTES: 0 %
Immature Grans (Abs): 0 10*3/uL (ref 0.0–0.1)
LYMPHS ABS: 1.2 10*3/uL (ref 0.7–3.1)
LYMPHS: 24 %
MCH: 32.4 pg (ref 26.6–33.0)
MCHC: 34.8 g/dL (ref 31.5–35.7)
MCV: 93 fL (ref 79–97)
MONOCYTES: 7 %
Monocytes Absolute: 0.3 10*3/uL (ref 0.1–0.9)
NEUTROS ABS: 3.1 10*3/uL (ref 1.4–7.0)
Neutrophils: 65 %
Platelets: 203 10*3/uL (ref 150–379)
RBC: 4.29 x10E6/uL (ref 4.14–5.80)
RDW: 13.1 % (ref 12.3–15.4)
WBC: 4.8 10*3/uL (ref 3.4–10.8)

## 2017-09-09 LAB — COMPREHENSIVE METABOLIC PANEL
A/G RATIO: 2 (ref 1.2–2.2)
ALT: 55 IU/L — ABNORMAL HIGH (ref 0–44)
AST: 32 IU/L (ref 0–40)
Albumin: 4.3 g/dL (ref 3.5–5.5)
Alkaline Phosphatase: 67 IU/L (ref 39–117)
BILIRUBIN TOTAL: 0.3 mg/dL (ref 0.0–1.2)
BUN/Creatinine Ratio: 15 (ref 9–20)
BUN: 15 mg/dL (ref 6–24)
CALCIUM: 9.1 mg/dL (ref 8.7–10.2)
CO2: 24 mmol/L (ref 20–29)
Chloride: 103 mmol/L (ref 96–106)
Creatinine, Ser: 0.98 mg/dL (ref 0.76–1.27)
GFR calc Af Amer: 101 mL/min/{1.73_m2} (ref >59)
GFR, EST NON AFRICAN AMERICAN: 87 mL/min/{1.73_m2} (ref >59)
GLOBULIN, TOTAL: 2.1 g/dL (ref 1.5–4.5)
Glucose: 104 mg/dL — ABNORMAL HIGH (ref 65–99)
POTASSIUM: 4 mmol/L (ref 3.5–5.2)
SODIUM: 140 mmol/L (ref 134–144)
Total Protein: 6.4 g/dL (ref 6.0–8.5)

## 2017-09-09 LAB — THYROID PEROXIDASE ANTIBODY: Thyroperoxidase Ab SerPl-aCnc: 8 IU/mL (ref 0–34)

## 2017-09-09 LAB — TSH: TSH: 1.11 u[IU]/mL (ref 0.450–4.500)

## 2017-09-09 LAB — ALPHA-GAL PANEL
ALPHA GAL IGE: 74.1 kU/L — AB (ref <0.10)
Beef (Bos spp) IgE: 25.6 kU/L — ABNORMAL HIGH (ref <0.35)
Class Interpretation: 3
Class Interpretation: 4
LAMB/MUTTON (OVIS SPP) IGE: 4.77 kU/L — AB (ref <0.35)
PORK CLASS INTERPRETATION: 3
Pork (Sus spp) IgE: 8.8 kU/L — ABNORMAL HIGH (ref <0.35)

## 2017-09-09 LAB — T4: T4 TOTAL: 6.7 ug/dL (ref 4.5–12.0)

## 2017-09-12 ENCOUNTER — Other Ambulatory Visit: Payer: Self-pay

## 2017-09-12 MED ORDER — EPINEPHRINE 0.3 MG/0.3ML IJ SOAJ: 0.3000 mg | Freq: Once | INTRAMUSCULAR | 3 refills | Status: AC

## 2017-09-14 NOTE — Progress Notes (Signed)
[name] [place] [place], [place] [address] Phone: [phone] Subjective:    CC: Knee pain follow-up  [id number]  Daniel Lam is a 56 y.o. male coming in with complaint of right knee pain.  Patient was found to have a partial tear of the LCL.  Patient was given a brace, topical anti-inflammatories, icing range of motion and home exercises.  Patient states that he is not doing any better. He has been hiking and biking but has not played tennis yet.  Patient would state that is about 75% better.  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        Past Medical History:  Diagnosis Date  . Anxiety state, unspecified   . Atypical mole 2008  . Complication of anesthesia    had some difficulty voiding  . Dermatitis    anal dermatitis  . Hemorrhoids   . Herpes simplex without mention of complication   . 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 Numbness and tingling   . Other and unspecified anterior pituitary hyperfunction   . Sleep apnea   . Unspecified asthma(493.90)    controlled   Past Surgical History:  Procedure Laterality Date  . APPENDECTOMY    . KNEE ARTHROSCOPY  09/01/2011   Procedure: ARTHROSCOPY KNEE;  Surgeon: Alta Corning;  Location: [facility];  Service: Orthopedics;  Laterality: Left;  medial and lateral meniscus tear debridment  and chondroplasty  . LASIK    . ORCHIECTOMY     Lt. 08/2009   Social History   Socioeconomic History  . Marital status: Married    Spouse name: Not on file  . Number of children: 3  . Years of education: MD  . Highest education level: Not on file  Social Needs  . Financial resource strain: Not on file  . Food insecurity - worry: Not on file  . Food insecurity - inability: Not on file  . Transportation needs - medical: Not on file  . Transportation needs - non-medical: Not on file    Occupational History  . Occupation: physician    Comment: internal medicine  Tobacco Use  . Smoking status: Current Some Day Smoker    Packs/day: 0.25    Types: Cigarettes, Cigars  . Smokeless tobacco: Never Used  . Tobacco comment: Occasional cigar  Substance and Sexual Activity  . Alcohol use: Yes    Alcohol/week: 0.0 oz    Comment: occ  . Drug use: No  . Sexual activity: Yes    Partners: Female  Other Topics Concern  . Not on file  Social History Narrative   Regular exercise: yes, very active   Caffeine use: 2 cups of coffee and soda's daily.   Right-handed.   Allergies  Allergen Reactions  . Demerol [Meperidine Hcl]     Difficulty voiding  . Latex     REACTION: SOB, rash, itching  . Morphine And Related     Difficulty voiding   Family History  Problem Relation Age of Onset  . Sjogren's syndrome Mother   . COPD Mother   . Colon polyps Father   . Dementia Father   . Throat cancer Father   . Melanoma Brother   . Throat cancer Unknown        Larynx  . Colon cancer Neg Hx   . Esophageal cancer Neg Hx   .  Stomach cancer Neg Hx   . Rectal cancer Neg Hx      Past medical history, social, surgical and family history all reviewed in electronic medical record.  No pertanent information unless stated regarding to the chief complaint.   Review of Systems:Review of systems updated and as accurate as of 09/14/17  No headache, visual changes, nausea, vomiting, diarrhea, constipation, dizziness, abdominal pain, skin rash, fevers, chills, night sweats, weight loss, swollen lymph nodes, body aches, joint swelling, muscle aches, chest pain, shortness of breath, mood changes.   Objective  There were no vitals taken for this visit. Systems examined below as of 09/14/17   General: No apparent distress alert and oriented x3 mood and affect normal, dressed appropriately.  HEENT: Pupils equal, extraocular movements intact  Respiratory: Patient's speak in full sentences and  does not appear short of breath  Cardiovascular: No lower extremity edema, non tender, no erythema  Skin: Warm dry intact with no signs of infection or rash on extremities or on axial skeleton.  Abdomen: Soft nontender  Neuro: Cranial nerves II through XII are intact, neurovascularly intact in all extremities with 2+ DTRs and 2+ pulses.  Lymph: No lymphadenopathy of posterior or anterior cervical chain or axillae bilaterally.  Gait normal with good balance and coordination.  MSK:  Non tender with full range of motion and good stability and symmetric strength and tone of shoulders, elbows, wrist, hip, and ankles bilaterally.  Knee: Left Normal to inspection with no erythema or effusion or obvious bony abnormalities. Palpation normal with no warmth, joint line tenderness, patellar tenderness, or condyle tenderness. ROM full in flexion and extension and lower leg rotation. Still some mild laxity noted of the LCL but does have a positive endpoint.  Seems to be improved from previous exam.  Other ligaments appear to be intact Negative Mcmurray's, Apley's, and Thessalonian tests. Non painful patellar compression. Patellar glide without crepitus. Patellar and quadriceps tendons unremarkable. Hamstring and quadriceps strength is normal. Contralateral knee unremarkable  Limited musculoskeletal ultrasound was performed and interpreted by Lyndal Pulley  Limited ultrasound of the LCL shows the patient does have interval healing noted.  Still has some mild hypoechoic changes in the surrounding area.  Does have some increasing neovascularization.   Impression and Recommendations:     This case required medical decision making of moderate complexity.      Note: This dictation was prepared with Dragon dictation along with smaller phrase technology. Any transcriptional errors that result from this process are unintentional.

## 2017-09-15 ENCOUNTER — Ambulatory Visit (INDEPENDENT_AMBULATORY_CARE_PROVIDER_SITE_OTHER): Payer: 59 | Admitting: Family Medicine

## 2017-09-15 ENCOUNTER — Ambulatory Visit: Payer: Self-pay

## 2017-09-15 ENCOUNTER — Encounter: Payer: Self-pay | Admitting: Family Medicine

## 2017-09-15 VITALS — BP 118/82 | HR 66

## 2017-09-15 DIAGNOSIS — S83422A Sprain of lateral collateral ligament of left knee, initial encounter: Secondary | ICD-10-CM

## 2017-09-15 DIAGNOSIS — M25561 Pain in right knee: Principal | ICD-10-CM

## 2017-09-15 DIAGNOSIS — G8929 Other chronic pain: Secondary | ICD-10-CM

## 2017-09-15 NOTE — Assessment & Plan Note (Signed)
Shows interval healing.  Patient still having some pain.  We discussed the possibility of advanced imaging to further evaluate if there is any other reason patient continues to have some discomfort and is unable to run or lateral translation.  I believe the patient will do relatively well overall.  Patient encouraged by this and will continue with conservative therapy and follow-up with me again in 2 months if not completely resolved

## 2017-09-21 DIAGNOSIS — G4733 Obstructive sleep apnea (adult) (pediatric): Secondary | ICD-10-CM | POA: Diagnosis not present

## 2017-10-12 MED FILL — MONTELUKAST SOD 10 MG TAB: 10 | 30 days supply | Qty: 30 | Fill #1

## 2017-10-27 DIAGNOSIS — H524 Presbyopia: Secondary | ICD-10-CM | POA: Diagnosis not present

## 2017-11-14 ENCOUNTER — Other Ambulatory Visit: Payer: Self-pay | Admitting: *Deleted

## 2017-11-14 DIAGNOSIS — M25562 Pain in left knee: Secondary | ICD-10-CM

## 2017-11-15 ENCOUNTER — Ambulatory Visit: Payer: 59 | Admitting: Allergy and Immunology

## 2017-11-15 ENCOUNTER — Encounter: Payer: Self-pay | Admitting: Allergy and Immunology

## 2017-11-15 VITALS — BP 116/74 | HR 68 | Resp 16

## 2017-11-15 DIAGNOSIS — J3089 Other allergic rhinitis: Secondary | ICD-10-CM

## 2017-11-15 DIAGNOSIS — T7800XD Anaphylactic reaction due to unspecified food, subsequent encounter: Secondary | ICD-10-CM

## 2017-11-15 DIAGNOSIS — J4540 Moderate persistent asthma, uncomplicated: Secondary | ICD-10-CM

## 2017-11-15 DIAGNOSIS — F17200 Nicotine dependence, unspecified, uncomplicated: Secondary | ICD-10-CM | POA: Diagnosis not present

## 2017-11-15 NOTE — Progress Notes (Signed)
Follow-up Note  Referring Provider: Ann Held, * Primary Provider: Carollee Herter, Alferd Apa, DO Date of Office Visit: 11/15/2017  Subjective:   Daniel Lam (DOB: 01/24/1961) is a 57 y.o. male who returns to the [facility] on 11/15/2017 in re-evaluation of the following:  HPI: Dillion return to this clinic in reevaluation of his recurrent allergic reactions and his history of allergic rhinitis and asthma.  I have not seen him in his clinic since his initial evaluation of 06 September 2017.  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  When he goes out to restaurants he will take a Xyzal and montelukast but otherwise does not use these medications on a regular basis.  He does have an injectable epinephrine device.    As well, he has been avoiding shellfish members and would like to further define the exact shellfish member that he can or cannot eat.  On previous skin testing he identified hypersensitivity to shellfish and crab and lobster.  His airway is doing quite well at this point and he does not need to activate his plan where he utilizes Symbicort and albuterol.  Unfortunately he still continues to smoke 1-2 cigarette a few times per week for stress reduction.  His nose has been doing relatively well.  Allergies as of 11/15/2017      Reactions   Demerol [meperidine Hcl]    Difficulty voiding   Latex    REACTION: SOB, rash, itching   Morphine And Related    Difficulty voiding      Medication List      acetaminophen 500 MG tablet Commonly known as:  TYLENOL Take 500 mg by mouth every 6 (six) hours as needed.   ALPRAZolam 0.5 MG tablet Commonly known as:  XANAX Take 0.5 mg by mouth at bedtime as needed.   AUVI-Q 0.3 mg/0.3 mL Soaj  injection Generic drug:  EPINEPHrine   FLUoxetine 20 MG capsule Commonly known as:  PROZAC Take 20 mg by mouth daily.   fluticasone 0.005 % ointment Commonly known as:  CUTIVATE Apply 1 application topically 2 (two) times daily.   ibuprofen 200 MG tablet Commonly known as:  ADVIL,MOTRIN Take 200 mg by mouth every 6 (six) hours as needed.   lidocaine 3 % Crea cream Commonly known as:  LINDAMANTLE Apply 1 application topically as needed.   lidocaine 5 % ointment Commonly known as:  XYLOCAINE Apply 1 application topically as needed.   montelukast 10 MG tablet Commonly known as:  SINGULAIR Take 1 tablet (10 mg total) by mouth at bedtime.   NONFORMULARY OR COMPOUNDED ITEM RESMED Standard Air Tubing for CPAP   NONFORMULARY OR COMPOUNDED ITEM ResMed Swift FX nasal pillows mask (Medium)   ROGAINE MENS EX Apply topically 2 (two) times daily.   valACYclovir 1000 MG tablet Commonly known as:  VALTREX TAKE 1 TABLET (1,000 MG TOTAL) BY MOUTH DAILY.   VENTOLIN HFA 108 (90 Base) MCG/ACT inhaler Generic drug:  albuterol INHALE 2 PUFFS BY MOUTH INTO THE LUNGS 4 (FOUR) TIMES DAILY.   Vitamin D (Ergocalciferol) 50000 units Caps capsule Commonly known as:  DRISDOL Take 1 capsule (50,000 Units total) by mouth every 7 (seven) days.       Past Medical History:  Diagnosis Date  .  Anxiety state, unspecified   . Atypical mole 2008  . Complication of anesthesia    had some difficulty voiding  . Dermatitis    anal dermatitis  . Hemorrhoids   . Herpes simplex without mention of complication   . 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 Numbness and tingling   . Other and unspecified anterior pituitary hyperfunction   . Sleep apnea   . Unspecified asthma(493.90)    controlled    Past Surgical History:  Procedure Laterality Date  . APPENDECTOMY    . KNEE ARTHROSCOPY  09/01/2011   Procedure: ARTHROSCOPY KNEE;  Surgeon: Alta Corning;  Location: [facility];  Service: Orthopedics;  Laterality: Left;  medial and lateral meniscus tear debridment  and chondroplasty  . LASIK    . ORCHIECTOMY     Lt. 08/2009    Review of systems negative except as noted in HPI / PMHx or noted below:  Review of Systems  Constitutional: Negative.   HENT: Negative.   Eyes: Negative.   Respiratory: Negative.   Cardiovascular: Negative.   Gastrointestinal: Negative.   Genitourinary: Negative.   Musculoskeletal: Negative.   Skin: Negative.   Neurological: Negative.   Endo/Heme/Allergies: Negative.   Psychiatric/Behavioral: Negative.      Objective:   Vitals:   11/15/17 1800  BP: 116/74  Pulse: 68  Resp: 16          Physical Exam  Constitutional: He is well-developed, well-nourished, and in no distress.  HENT:  Head: Normocephalic.  Right Ear: Tympanic membrane, external ear and ear canal normal.  Left Ear: Tympanic membrane, external ear and ear canal normal.  Nose: Nose normal. No mucosal edema or rhinorrhea.  Mouth/Throat: Uvula is midline, oropharynx is clear and moist and mucous membranes are normal. No oropharyngeal exudate.  Eyes: Conjunctivae are normal.  Neck: Trachea normal. No tracheal tenderness present. No tracheal deviation present. No thyromegaly present.  Cardiovascular: Normal rate, regular rhythm, S1 normal, S2 normal and normal heart sounds.  No murmur heard. Pulmonary/Chest: Breath sounds normal. No stridor. No respiratory distress. He has no wheezes. He has no rales.  Musculoskeletal: He exhibits no edema.  Lymphadenopathy:       Head (right side): No tonsillar adenopathy present.       Head (left side): No tonsillar adenopathy present.    He has no cervical adenopathy.  Neurological: He is alert. Gait normal.  Skin: No rash noted. He is not diaphoretic. No erythema. Nails show no clubbing.  Psychiatric: Mood and affect normal.    Diagnostics:    Spirometry was performed  and demonstrated an FEV1 of 3.91 at 102 % of predicted.  The patient had an Asthma Control Test with the following results: ACT Total Score: 22.    Results of blood tests obtained 06 September 2017 identified normal hepatic and renal function was slightly elevated ALT at 55 IU/L, WBC 4.8, absolute eosinophil 200, absolute lymphocyte 1200, hemoglobin 13.9, platelet 203, normal TSH, normal T4, thyroid peroxidase antibody at 34 IUs/mL, beef IgE 25.6 KU/L, lamb 4.77 KU/L, pork 8.8 KU/L, alpha gal 74.10 KU/L  Assessment and Plan:   1. Anaphylactic shock due to food, subsequent encounter   2. Asthma, moderate persistent, well-controlled   3. Other allergic rhinitis   4. Light tobacco smoker     1.  Allergen avoidance measures - mammal & shellfish  2.  Use a combination of xyzal 2.5 - 5.0 plus montelukast  10 mg 1 time per day  3. Auvi-Q 0.3, benadryl, MD/ER evaluation for allergic reaction  4.  Continue Symbicort and albuterol MDI during periods of increased asthma activity  5.  Always consider a nicotine replacement for tobacco smoke exposure  6. Blood - shellfish panel  7.  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  Maybe in a few years we can reapproach his sensitivity by rechecking alpha gal titers.  His airway appears to be doing relatively well at this point and I did once again mention to him about a substitute for his very intermittent tobacco exposure.  We will further define the exact shellfish members that he cannot eat with a shellfish IgE panel.  I will see him back in his clinic in 1 year or earlier if there is a problem.  Allena Katz, MD Allergy / Immunology [facility]

## 2017-11-15 NOTE — Patient Instructions (Addendum)
  1.  Allergen avoidance measures - mammal & shellfish  2.  Use a combination of xyzal 2.5 - 5.0 plus montelukast 10 mg 1 time per day  3. Auvi-Q 0.3, benadryl, MD/ER evaluation for allergic reaction  4.  Continue Symbicort and albuterol MDI during periods of increased asthma activity  5.  Always consider a nicotine replacement for tobacco smoke exposure  6. Blood - shellfish panel  7.  Return to clinic in 1 year or earlier if problem

## 2017-11-16 ENCOUNTER — Encounter: Payer: Self-pay | Admitting: Allergy and Immunology

## 2017-11-17 MED FILL — FLUoxetine HCL 20 MG CAPS: 20 | 90 days supply | Qty: 90 | Fill #2

## 2017-11-17 NOTE — Addendum Note (Signed)
Addended by: Herbie Drape on: 11/17/2017 09:38 AM   Modules accepted: Orders

## 2017-11-18 DIAGNOSIS — T7800XD Anaphylactic reaction due to unspecified food, subsequent encounter: Secondary | ICD-10-CM | POA: Diagnosis not present

## 2017-11-20 ENCOUNTER — Other Ambulatory Visit: Payer: 59

## 2017-11-20 ENCOUNTER — Ambulatory Visit
Admission: RE | Admit: 2017-11-20 | Discharge: 2017-11-20 | Disposition: A | Discharge status: Home / Self Care | Payer: 59 | Source: Ambulatory Visit | Attending: Family Medicine | Admitting: Family Medicine

## 2017-11-20 DIAGNOSIS — M25562 Pain in left knee: Secondary | ICD-10-CM

## 2017-11-20 DIAGNOSIS — M7989 Other specified soft tissue disorders: Secondary | ICD-10-CM | POA: Diagnosis not present

## 2017-11-21 ENCOUNTER — Other Ambulatory Visit: Payer: Self-pay | Admitting: *Deleted

## 2017-11-21 DIAGNOSIS — S838X2A Sprain of other specified parts of left knee, initial encounter: Secondary | ICD-10-CM

## 2017-11-21 LAB — ALLERGEN PROFILE, SHELLFISH
Clam IgE: 0.13 kU/L — AB
F023-IgE Crab: 0.47 kU/L — AB
F290-IgE Oyster: <0.1 kU/L
Scallop IgE: <0.1 kU/L
Shrimp IgE: 0.16 kU/L — AB

## 2017-11-21 LAB — SPECIMEN STATUS REPORT

## 2017-12-04 ENCOUNTER — Encounter: Payer: Self-pay | Admitting: Allergy and Immunology

## 2017-12-09 ENCOUNTER — Other Ambulatory Visit: Payer: Self-pay | Admitting: Family Medicine

## 2017-12-09 ENCOUNTER — Other Ambulatory Visit: Payer: Self-pay

## 2017-12-09 MED ORDER — PSEUDOEPHEDRINE HCL 30 MG PO TABS: 30.0000 mg | ORAL_TABLET | ORAL | 0 refills | Status: DC | PRN

## 2017-12-09 MED ORDER — AZELASTINE HCL 0.1 % NA SOLN: 2.0000 | Freq: Two times a day (BID) | NASAL | 12 refills | Status: DC

## 2017-12-09 MED FILL — AZELASTINE HCL 137 MCG SPRY: 0.1 | 30 days supply | Qty: 30 | Fill #0

## 2017-12-09 NOTE — Progress Notes (Signed)
Called in [facility] for pt. He was notified. No questions.

## 2018-01-05 DIAGNOSIS — F418 Other specified anxiety disorders: Secondary | ICD-10-CM | POA: Diagnosis not present

## 2018-01-05 DIAGNOSIS — F3289 Other specified depressive episodes: Secondary | ICD-10-CM | POA: Diagnosis not present

## 2018-01-12 DIAGNOSIS — F418 Other specified anxiety disorders: Secondary | ICD-10-CM | POA: Diagnosis not present

## 2018-01-17 ENCOUNTER — Other Ambulatory Visit: Payer: Self-pay | Admitting: Occupational Medicine

## 2018-01-17 LAB — LIPID PANEL: LDL Cholesterol: 104

## 2018-01-17 LAB — BASIC METABOLIC PANEL
BUN: 19 (ref 4–21)
Creatinine: 1.1 (ref 0.6–1.3)
GLUCOSE: 104
Sodium: 137 (ref 137–147)

## 2018-01-17 LAB — HEPATIC FUNCTION PANEL
ALT: 66 — AB (ref 10–40)
AST: 28 (ref 14–40)
Alkaline Phosphatase: 40 (ref 25–125)
BILIRUBIN, TOTAL: 0.4

## 2018-01-17 LAB — PSA: PSA: 0.5

## 2018-01-17 LAB — CBC AND DIFFERENTIAL
Neutrophils Absolute: 2503
WBC: 4.3

## 2018-01-18 LAB — COMPLETE METABOLIC PANEL WITH GFR
AG Ratio: 1.9 (calc) (ref 1.0–2.5)
ALBUMIN MSPROF: 4.1 g/dL (ref 3.6–5.1)
ALKALINE PHOSPHATASE (APISO): 66 U/L (ref 33–130)
ALT: 40 U/L
AST: 28 U/L (ref 10–35)
BUN: 19 mg/dL (ref 7–25)
CALCIUM: 9.2 mg/dL
CO2: 27 mmol/L (ref 20–32)
CREATININE: 1.14 mg/dL
Chloride: 104 mmol/L (ref 98–110)
Globulin: 2.2 g/dL (calc) (ref 1.9–3.7)
Glucose, Bld: 104 mg/dL — ABNORMAL HIGH (ref 65–99)
POTASSIUM: 3.9 mmol/L (ref 3.5–5.3)
Sodium: 137 mmol/L (ref 135–146)
Total Bilirubin: 0.4 mg/dL (ref 0.2–1.2)
Total Protein: 6.3 g/dL (ref 6.1–8.1)

## 2018-01-18 LAB — LIPID PANEL
CHOL/HDL RATIO: 3.1 (calc) (ref <5.0)
Cholesterol: 183 mg/dL (ref <200)
HDL: 59 mg/dL
LDL CHOLESTEROL (CALC): 104 mg/dL — AB
NON-HDL CHOLESTEROL (CALC): 124 mg/dL (ref <130)
Triglycerides: 104 mg/dL (ref <150)

## 2018-01-18 LAB — CBC WITH DIFFERENTIAL/PLATELET
BASOS PCT: 0.9 %
Basophils Absolute: 39 cells/uL (ref 0–200)
Eosinophils Absolute: 151 cells/uL (ref 15–500)
Eosinophils Relative: 3.5 %
HEMATOCRIT: 40.2 % (ref 38.5–45.0)
HEMOGLOBIN: 14.1 g/dL (ref 13.2–15.5)
LYMPHS ABS: 1127 {cells}/uL (ref 850–3900)
MCH: 33.3 pg — ABNORMAL HIGH (ref 27.0–33.0)
MCHC: 35.1 g/dL (ref 32.0–36.0)
MCV: 94.8 fL (ref 80.0–100.0)
MPV: 10 fL (ref 7.5–12.5)
Monocytes Relative: 11.2 %
NEUTROS ABS: 2503 {cells}/uL (ref 1500–7800)
NEUTROS PCT: 58.2 %
Platelets: 196 10*3/uL (ref 140–400)
RBC: 4.24 10*6/uL (ref 4.20–5.10)
RDW: 12.3 % (ref 11.0–15.0)
Total Lymphocyte: 26.2 %
WBC: 4.3 10*3/uL (ref 3.8–10.8)
WBCMIX: 482 {cells}/uL (ref 200–950)

## 2018-01-18 LAB — PSA: PSA: 0.5 ng/mL

## 2018-01-19 DIAGNOSIS — F418 Other specified anxiety disorders: Secondary | ICD-10-CM | POA: Diagnosis not present

## 2018-01-26 ENCOUNTER — Other Ambulatory Visit: Payer: Self-pay | Admitting: Family Medicine

## 2018-01-26 DIAGNOSIS — L814 Other melanin hyperpigmentation: Secondary | ICD-10-CM | POA: Diagnosis not present

## 2018-01-26 DIAGNOSIS — J069 Acute upper respiratory infection, unspecified: Secondary | ICD-10-CM

## 2018-01-26 DIAGNOSIS — L910 Hypertrophic scar: Secondary | ICD-10-CM | POA: Diagnosis not present

## 2018-01-26 DIAGNOSIS — D485 Neoplasm of uncertain behavior of skin: Secondary | ICD-10-CM | POA: Diagnosis not present

## 2018-01-26 DIAGNOSIS — D1801 Hemangioma of skin and subcutaneous tissue: Secondary | ICD-10-CM | POA: Diagnosis not present

## 2018-01-26 DIAGNOSIS — Z872 Personal history of diseases of the skin and subcutaneous tissue: Secondary | ICD-10-CM | POA: Diagnosis not present

## 2018-01-26 DIAGNOSIS — D225 Melanocytic nevi of trunk: Secondary | ICD-10-CM | POA: Diagnosis not present

## 2018-01-26 MED ORDER — PSEUDOEPHEDRINE HCL 60 MG PO TABS: 60.0000 mg | ORAL_TABLET | ORAL | 0 refills | Status: DC | PRN

## 2018-01-26 MED FILL — SUDOGEST 60 MG TABLET: 60 | 5 days supply | Qty: 30 | Fill #0

## 2018-02-01 ENCOUNTER — Other Ambulatory Visit: Payer: Self-pay | Admitting: Family Medicine

## 2018-02-07 MED FILL — FLUTICASONE PROP 0.005% OIN: 0.005 | 15 days supply | Qty: 30 | Fill #0

## 2018-02-09 DIAGNOSIS — F418 Other specified anxiety disorders: Secondary | ICD-10-CM | POA: Diagnosis not present

## 2018-02-09 DIAGNOSIS — F3289 Other specified depressive episodes: Secondary | ICD-10-CM | POA: Diagnosis not present

## 2018-02-09 MED FILL — MONTELUKAST SOD 10 MG TAB: 10 | 30 days supply | Qty: 30 | Fill #2

## 2018-02-09 MED FILL — FLUoxetine HCL 20 MG CAPS: 20 | 90 days supply | Qty: 90 | Fill #3

## 2018-02-17 ENCOUNTER — Other Ambulatory Visit: Payer: Self-pay | Admitting: Family Medicine

## 2018-02-17 ENCOUNTER — Other Ambulatory Visit (INDEPENDENT_AMBULATORY_CARE_PROVIDER_SITE_OTHER): Payer: 59

## 2018-02-17 DIAGNOSIS — Z23 Encounter for immunization: Secondary | ICD-10-CM | POA: Diagnosis not present

## 2018-02-20 ENCOUNTER — Encounter: Payer: Self-pay | Admitting: Family Medicine

## 2018-02-20 LAB — MEASLES/MUMPS/RUBELLA IMMUNITY
Mumps IgG: 267 AU/mL
RUBELLA: 31.1 {index}

## 2018-04-03 ENCOUNTER — Other Ambulatory Visit: Payer: Self-pay | Admitting: Family Medicine

## 2018-04-03 MED ORDER — VALACYCLOVIR HCL 1 G PO TABS: 1000.0000 mg | ORAL_TABLET | Freq: Every day | ORAL | 3 refills | Status: DC

## 2018-04-03 MED FILL — valACYclovir HCL 1 GM TABS: 1 | 90 days supply | Qty: 90 | Fill #0

## 2018-04-10 MED FILL — MONTELUKAST SOD 10 MG TAB: 10 | 30 days supply | Qty: 30 | Fill #3

## 2018-04-20 DIAGNOSIS — F3289 Other specified depressive episodes: Secondary | ICD-10-CM | POA: Diagnosis not present

## 2018-04-20 DIAGNOSIS — F418 Other specified anxiety disorders: Secondary | ICD-10-CM | POA: Diagnosis not present

## 2018-05-10 MED FILL — MONTELUKAST SOD 10 MG TAB: 10 | 30 days supply | Qty: 30 | Fill #4

## 2018-05-11 ENCOUNTER — Other Ambulatory Visit: Payer: Self-pay | Admitting: Family Medicine

## 2018-05-11 MED FILL — FLUoxetine HCL 20 MG CAPS: 20 | 90 days supply | Qty: 90 | Fill #0

## 2018-05-12 MED FILL — VENTOLIN HFA 90 MCG INHALER: 108 (90 BAS | 25 days supply | Qty: 18 | Fill #0

## 2018-06-21 DIAGNOSIS — G4733 Obstructive sleep apnea (adult) (pediatric): Secondary | ICD-10-CM | POA: Diagnosis not present

## 2018-07-18 MED FILL — MONTELUKAST SOD 10 MG TAB: 10 | 30 days supply | Qty: 30 | Fill #5

## 2018-07-20 ENCOUNTER — Ambulatory Visit (INDEPENDENT_AMBULATORY_CARE_PROVIDER_SITE_OTHER): Payer: 59 | Admitting: Psychiatry

## 2018-07-20 DIAGNOSIS — F411 Generalized anxiety disorder: Secondary | ICD-10-CM

## 2018-07-20 MED ORDER — FLUOXETINE HCL 20 MG PO CAPS: 20.0000 mg | ORAL_CAPSULE | Freq: Every day | ORAL | 3 refills | Status: DC

## 2018-07-20 NOTE — Progress Notes (Signed)
[facility]  Patient ID: Daniel Lam,  MRN: [id number]  PCP: Ann Held, DO  Date of Evaluation: 07/20/2018 Time spent:20 minutes   HISTORY/CURRENT STATUS: HPI Still stressed with work and marriage, but no something a pill will fix.  Satisfied with meds. Rare Xanax, but a little more needed in the last few months...mostky home.  Lack of attraction to wife. Did not tolerate Trintellix  Individual Medical History/ Review of Systems: Changes? :No  Allergies: Demerol [meperidine hcl]; Latex; and Morphine and related  Current Medications:  Current Outpatient Medications:  .  acetaminophen (TYLENOL) 500 MG tablet, Take 500 mg by mouth every 6 (six) hours as needed., Disp: , Rfl:  .  ALPRAZolam (XANAX) 0.5 MG tablet, Take 0.5 mg by mouth at bedtime as needed.  , Disp: , Rfl:  .  FLUoxetine (PROZAC) 20 MG capsule, Take 20 mg by mouth daily.  , Disp: , Rfl:  .  valACYclovir (VALTREX) 1000 MG tablet, Take 1 tablet (1,000 mg total) by mouth daily., Disp: 90 tablet, Rfl: 3 .  Vitamin D, Ergocalciferol, (DRISDOL) 50000 units CAPS capsule, Take 1 capsule (50,000 Units total) by mouth every 7 (seven) days., Disp: 4 capsule, Rfl: 2 .  AUVI-Q 0.3 MG/0.3ML SOAJ injection, , Disp: , Rfl:  .  azelastine (ASTELIN) 0.1 % nasal spray, Place 2 sprays into both nostrils 2 (two) times daily. Use in each nostril as directed, Disp: 30 mL, Rfl: 12 .  fluticasone (CUTIVATE) 0.005 % ointment, APPLY 1 APPLICATION TOPICALLY 2 TIMES DAILY., Disp: 30 g, Rfl: 3 .  ibuprofen (ADVIL,MOTRIN) 200 MG tablet, Take 200 mg by mouth every 6 (six) hours as needed., Disp: , Rfl:  .  lidocaine (LINDAMANTLE) 3 % CREA cream, Apply 1 application topically as needed., Disp: 85 g, Rfl: 3 .  lidocaine (XYLOCAINE) 5 % ointment, Apply 1 application topically as needed., Disp: 30 g, Rfl: 0 .  Minoxidil (ROGAINE MENS EX), Apply topically 2 (two) times daily., Disp: , Rfl:  .  montelukast (SINGULAIR) 10 MG tablet,  Take 1 tablet (10 mg total) by mouth at bedtime., Disp: 30 tablet, Rfl: 5 .  NONFORMULARY OR COMPOUNDED ITEM, RESMED Standard Air Tubing for CPAP, Disp: 1 each, Rfl: 11 .  NONFORMULARY OR COMPOUNDED ITEM, ResMed Swift FX nasal pillows mask (Medium), Disp: 1 each, Rfl: 11 .  pseudoephedrine (SUDAFED) 30 MG tablet, Take 1 tablet (30 mg total) by mouth every 4 (four) hours as needed for congestion., Disp: 30 tablet, Rfl: 0 .  pseudoephedrine (SUDAFED) 60 MG tablet, Take 1 tablet (60 mg total) by mouth every 4 (four) hours as needed for congestion., Disp: 30 tablet, Rfl: 0 .  VENTOLIN HFA 108 (90 Base) MCG/ACT inhaler, INHALE 2 PUFFS BY MOUTH INTO THE LUNGS 4 TIMES DAILY., Disp: 18 g, Rfl: 11 Medication Side Effects: None  Family Medical/ Social History: Changes? Yes Cont marital px.  Went to therapy which helped a little.  MENTAL HEALTH EXAM:  There were no vitals taken for this visit.There is no height or weight on file to calculate BMI.  General Appearance: Neat  Eye Contact:  Good  Speech:  Clear and Coherent  Volume:  Normal  Mood:  OK  Affect:  Full Range  Thought Process:  Goal Directed  Orientation:  Full (Time, Place, and Person)  Thought Content: WDL   Suicidal Thoughts:  No  Homicidal Thoughts:  No  Memory:  Recent  Judgement:  Good  Insight:  Good  Psychomotor  Activity:  Normal  Concentration:  Concentration: Good  Recall:  Good  Fund of Knowledge: Good  Language: Good  Akathisia:  No  AIMS (if indicated): not done  Assets:  Communication Skills Desire for Improvement Financial Resources/Insurance [place] Talents/Skills Transportation Vocational/Educational  ADL's:  Intact  Cognition: WNL  Prognosis:  Good    DIAGNOSES:    ICD-10-CM   1. Generalized anxiety disorder F41.1     RECOMMENDATIONS:  Continue fluoxetine. Manage Xanax . No evidence for abuse.  Disc SE. Option add Wellbutrin to fluoxetine FU 1 year   Purnell Shoemaker, MD

## 2018-07-31 MED FILL — FLUoxetine HCL 20 MG CAPS: 20 | 90 days supply | Qty: 90 | Fill #0

## 2018-08-22 ENCOUNTER — Ambulatory Visit: Payer: 59 | Admitting: Family Medicine

## 2018-08-22 ENCOUNTER — Encounter: Payer: Self-pay | Admitting: Family Medicine

## 2018-08-22 VITALS — BP 115/69 | HR 67 | Temp 98.5°F | Resp 16 | Ht 70.0 in | Wt 210.0 lb

## 2018-08-22 DIAGNOSIS — J069 Acute upper respiratory infection, unspecified: Secondary | ICD-10-CM

## 2018-08-22 DIAGNOSIS — Z23 Encounter for immunization: Secondary | ICD-10-CM | POA: Diagnosis not present

## 2018-08-22 DIAGNOSIS — E538 Deficiency of other specified B group vitamins: Secondary | ICD-10-CM

## 2018-08-22 DIAGNOSIS — Z0000 Encounter for general adult medical examination without abnormal findings: Secondary | ICD-10-CM | POA: Diagnosis not present

## 2018-08-22 DIAGNOSIS — B009 Herpesviral infection, unspecified: Secondary | ICD-10-CM

## 2018-08-22 DIAGNOSIS — F411 Generalized anxiety disorder: Secondary | ICD-10-CM

## 2018-08-22 DIAGNOSIS — J4520 Mild intermittent asthma, uncomplicated: Secondary | ICD-10-CM | POA: Diagnosis not present

## 2018-08-22 DIAGNOSIS — B0223 Postherpetic polyneuropathy: Secondary | ICD-10-CM

## 2018-08-22 DIAGNOSIS — E559 Vitamin D deficiency, unspecified: Secondary | ICD-10-CM | POA: Diagnosis not present

## 2018-08-22 MED ORDER — PSEUDOEPHEDRINE HCL 60 MG PO TABS: 60.0000 mg | ORAL_TABLET | ORAL | 0 refills | Status: DC | PRN

## 2018-08-22 MED ORDER — BUDESONIDE-FORMOTEROL FUMARATE 160-4.5 MCG/ACT IN AERO: 2.0000 | INHALATION_SPRAY | Freq: Two times a day (BID) | RESPIRATORY_TRACT | 3 refills | Status: DC

## 2018-08-22 MED ORDER — ALBUTEROL SULFATE HFA 108 (90 BASE) MCG/ACT IN AERS: INHALATION_SPRAY | RESPIRATORY_TRACT | 11 refills | Status: DC

## 2018-08-22 MED ORDER — VALACYCLOVIR HCL 1 G PO TABS: 1000.0000 mg | ORAL_TABLET | Freq: Every day | ORAL | 3 refills | Status: DC

## 2018-08-22 MED FILL — valACYclovir HCL 1 GM TABS: 1 | 90 days supply | Qty: 90 | Fill #0

## 2018-08-22 MED FILL — VENTOLIN HFA 90 MCG INHALER: 108 (90 BAS | 17 days supply | Qty: 18 | Fill #0

## 2018-08-22 MED FILL — SUDOGEST 60 MG TABLET: 60 | 5 days supply | Qty: 30 | Fill #0

## 2018-08-22 NOTE — Assessment & Plan Note (Signed)
Stable °Per psych °

## 2018-08-22 NOTE — Assessment & Plan Note (Signed)
Stable con't symbicort and albuterol

## 2018-08-22 NOTE — Progress Notes (Signed)
Patient ID: Daniel Lam, male    DOB: [birth year]  Age: 57 y.o. MRN: [id number]    Subjective:  Subjective  HPI Daniel Lam presents for cpe.  No complaints.    Review of Systems  Constitutional: Negative.   HENT: Negative for congestion, ear pain, hearing loss, nosebleeds, postnasal drip, rhinorrhea, sinus pressure, sneezing and tinnitus.   Eyes: Negative for photophobia, discharge, itching and visual disturbance.  Respiratory: Negative.   Cardiovascular: Negative.   Gastrointestinal: Negative for abdominal distention, abdominal pain, anal bleeding, blood in stool and constipation.  Endocrine: Negative.   Genitourinary: Negative.   Musculoskeletal: Negative.   Skin: Negative.   Allergic/Immunologic: Negative.   Neurological: Negative for dizziness, weakness, light-headedness, numbness and headaches.  Psychiatric/Behavioral: Negative for agitation, confusion, decreased concentration, dysphoric mood, sleep disturbance and suicidal ideas. The patient is not nervous/anxious.     History Past Medical History:  Diagnosis Date  . Anxiety state, unspecified   . Atypical mole 2008  . Complication of anesthesia    had some difficulty voiding  . Dermatitis    anal dermatitis  . Hemorrhoids   . Herpes simplex without mention of complication   . 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 Numbness and tingling   . Other and unspecified anterior pituitary hyperfunction   . Sleep apnea   . Unspecified asthma(493.90)    controlled    He has a past surgical history that includes LASIK; Appendectomy; Orchiectomy; and Knee arthroscopy (09/01/2011).   His family history includes COPD in his mother; Colon polyps in his father; Dementia in his father; Melanoma in his brother; Sjogren's syndrome in his mother; Throat cancer in his father and unknown relative.He reports that he has been smoking cigarettes and cigars. He has been smoking about 0.25 packs  per day. He has never used smokeless tobacco. He reports that he drinks alcohol. He reports that he does not use drugs.  Current Outpatient Medications on File Prior to Visit  Medication Sig Dispense Refill  . FLUoxetine (PROZAC) 20 MG capsule Take 1 capsule (20 mg total) by mouth daily. 90 capsule 3  . fluticasone (CUTIVATE) 0.005 % ointment APPLY 1 APPLICATION TOPICALLY 2 TIMES DAILY. 30 g 3  . ibuprofen (ADVIL,MOTRIN) 200 MG tablet Take 200 mg by mouth every 6 (six) hours as needed.    . Minoxidil (ROGAINE MENS EX) Apply topically 2 (two) times daily.    . montelukast (SINGULAIR) 10 MG tablet Take 1 tablet (10 mg total) by mouth at bedtime. 30 tablet 5  . NONFORMULARY OR COMPOUNDED ITEM RESMED Standard Air Tubing for CPAP 1 each 11  . NONFORMULARY OR COMPOUNDED ITEM ResMed Swift FX nasal pillows mask (Medium) 1 each 11  . Vitamin D, Ergocalciferol, (DRISDOL) 50000 units CAPS capsule Take 1 capsule (50,000 Units total) by mouth every 7 (seven) days. 4 capsule 2  . acetaminophen (TYLENOL) 500 MG tablet Take 500 mg by mouth every 6 (six) hours as needed.    . ALPRAZolam (XANAX) 0.5 MG tablet Take 0.5 mg by mouth at bedtime as needed.      Marland Kitchen AUVI-Q 0.3 MG/0.3ML SOAJ injection     . azelastine (ASTELIN) 0.1 % nasal spray Place 2 sprays into both nostrils 2 (two) times daily. Use in each nostril as directed (Patient not taking: Reported on 08/22/2018) 30 mL 12  . lidocaine (LINDAMANTLE) 3 % CREA cream Apply 1 application topically as needed. (Patient not taking: Reported on 08/22/2018)  85 g 3  . lidocaine (XYLOCAINE) 5 % ointment Apply 1 application topically as needed. (Patient not taking: Reported on 08/22/2018) 30 g 0   No current facility-administered medications on file prior to visit.      Objective:  Objective  Physical Exam  Constitutional: He is oriented to person, place, and time. He appears well-developed and well-nourished. No distress.  HENT:  Head: Normocephalic and atraumatic.    Right Ear: External ear normal.  Left Ear: External ear normal.  Nose: Nose normal.  Mouth/Throat: Oropharynx is clear and moist. No oropharyngeal exudate.  Eyes: Pupils are equal, round, and reactive to light. Conjunctivae and EOM are normal. Right eye exhibits no discharge. Left eye exhibits no discharge.  Neck: Normal range of motion. Neck supple. No JVD present. Carotid bruit is not present. No thyromegaly present.  Cardiovascular: Normal rate, regular rhythm and intact distal pulses. Exam reveals no gallop and no friction rub.  No murmur heard. Pulmonary/Chest: Effort normal and breath sounds normal. No respiratory distress. He has no wheezes. He has no rales. He exhibits no tenderness.  Abdominal: Soft.  Genitourinary:  Genitourinary Comments: Per urology-- every 3 years   Musculoskeletal: Normal range of motion. He exhibits no edema or tenderness.  Lymphadenopathy:    He has no cervical adenopathy.  Neurological: He is alert and oriented to person, place, and time. He has normal reflexes. He displays normal reflexes. He exhibits normal muscle tone.  Skin: Skin is warm and dry. No rash noted. He is not diaphoretic. No erythema. No pallor.  Psychiatric: He has a normal mood and affect. His behavior is normal. Judgment and thought content normal.  Sees Dr Daniel Lam annually  Nursing note and vitals reviewed.  BP 115/69 (BP Location: Right Arm, Patient Position: Sitting, Cuff Size: Normal)   Pulse 67   Temp 98.5 F (36.9 C) (Oral)   Resp 16   Ht 5\' 10"  (1.778 m)   Wt 210 lb (95.3 kg) Comment: per MD  SpO2 96%   BMI 30.13 kg/m  Wt Readings from Last 3 Encounters:  08/22/18 210 lb (95.3 kg)  09/06/17 221 lb 9.6 oz (100.5 kg)  08/18/17 221 lb (100.2 kg)     Lab Results  Component Value Date   WBC 4.3 01/17/2018   HGB 14.1 01/17/2018   HCT 40.2 01/17/2018   PLT 196 01/17/2018   GLUCOSE 104 (H) 01/17/2018   CHOL 183 01/17/2018   TRIG 104 01/17/2018   HDL 59 01/17/2018    LDLDIRECT 119.0 11/27/2015   LDLCALC 104 (H) 01/17/2018   ALT 40 01/17/2018   AST 28 01/17/2018   NA 137 01/17/2018   K 3.9 01/17/2018   CL 104 01/17/2018   CREATININE 1.14 01/17/2018   BUN 19 01/17/2018   CO2 27 01/17/2018   TSH 1.110 09/06/2017   PSA 0.5 01/17/2018   HGBA1C 5.5 08/17/2016    Mr Knee Left  Wo Contrast  Result Date: 11/20/2017 CLINICAL DATA:  Pain and swelling for 4 months. Prior history of meniscal surgery. EXAM: MRI OF THE LEFT KNEE WITHOUT CONTRAST TECHNIQUE: Multiplanar, multisequence MR imaging of the knee was performed. No intravenous contrast was administered. COMPARISON:  MRI 08/05/2011 FINDINGS: MENISCI Medial meniscus: Evidence of prior partial medial meniscectomy in the midbody region without findings for recurrent tear. Lateral meniscus: Probable prior debridement of the anterior horn. Horizontal tear involving the anterior horn mid body junction region. LIGAMENTS Cruciates: Remote ACL and PCL injuries with a buckled and chronically partially torn  PCL. Collaterals:  Intact CARTILAGE Patellofemoral:  Normal Medial:  Mild degenerative chondrosis. Lateral:  Mild degenerative chondrosis. Joint:  No joint effusion. Popliteal Fossa:  No popliteal mass or Baker's cyst. Extensor Mechanism: The patella retinacular structures are intact and the quadriceps and patellar tendons are intact. Bones:  No acute bony findings. Other: Normal knee musculature. IMPRESSION: 1. Prior partial medial meniscectomy without findings for recurrent tear. 2. Suspect prior debridement of the anterior horn of the lateral meniscus with a horizontal tear involving the anterior horn mid body junction region. 3. Remote/chronic ACL and PCL injuries.  No acute findings. 4. No joint effusion or Baker's cyst. Electronically Signed   By: Marijo Sanes M.D.   On: 11/20/2017 11:34     Assessment & Plan:  Plan  I have changed Dr. Alda Berthold. Vallone's VENTOLIN HFA to albuterol. I am also having him start on  budesonide-formoterol. Additionally, I am having him maintain his ALPRAZolam, acetaminophen, ibuprofen, NONFORMULARY OR COMPOUNDED ITEM, NONFORMULARY OR COMPOUNDED ITEM, Vitamin D (Ergocalciferol), lidocaine, Minoxidil (ROGAINE MENS EX), lidocaine, montelukast, AUVI-Q, azelastine, fluticasone, FLUoxetine, pseudoephedrine, and valACYclovir.  Meds ordered this encounter  Medications  . budesonide-formoterol (SYMBICORT) 160-4.5 MCG/ACT inhaler    Sig: Inhale 2 puffs into the lungs 2 (two) times daily.    Dispense:  1 Inhaler    Refill:  3  . albuterol (VENTOLIN HFA) 108 (90 Base) MCG/ACT inhaler    Sig: INHALE 2 PUFFS BY MOUTH INTO THE LUNGS 4 TIMES DAILY.    Dispense:  18 g    Refill:  11  . pseudoephedrine (SUDAFED) 60 MG tablet    Sig: Take 1 tablet (60 mg total) by mouth every 4 (four) hours as needed for congestion.    Dispense:  30 tablet    Refill:  0  . valACYclovir (VALTREX) 1000 MG tablet    Sig: Take 1 tablet (1,000 mg total) by mouth daily.    Dispense:  90 tablet    Refill:  3    Problem List Items Addressed This Visit      Unprioritized   Annual physical exam - Primary    ghm utd  Pt wants to wait on shingrix  tdap given today  Flu - pt had previously See AVs Drs day labs reviewed--- to be abstracted in        Relevant Orders   TSH   Asthma    Stable con't symbicort and albuterol       Relevant Medications   budesonide-formoterol (SYMBICORT) 160-4.5 MCG/ACT inhaler   albuterol (VENTOLIN HFA) 108 (90 Base) MCG/ACT inhaler   Herpes simplex virus (HSV) infection    con't valtrex 1 g daily Pt states if he stops it he breaks out right away       Relevant Medications   valACYclovir (VALTREX) 1000 MG tablet    Other Visit Diagnoses    Need for Tdap vaccination       Relevant Orders   Tdap vaccine greater than or equal to 7yo IM (Completed)   Vitamin B12 deficiency       Relevant Orders   Vitamin B12   Vitamin D deficiency       Relevant Orders    Vitamin D (25 hydroxy)   Upper respiratory tract infection, unspecified type       Relevant Medications   pseudoephedrine (SUDAFED) 60 MG tablet   valACYclovir (VALTREX) 1000 MG tablet   Shingles (herpes zoster) polyneuropathy       Relevant Medications  valACYclovir (VALTREX) 1000 MG tablet      Follow-up: Return in about 1 year (around 08/23/2019), or if symptoms worsen or fail to improve, for annual exam, fasting.  Ann Held, DO

## 2018-08-22 NOTE — Patient Instructions (Signed)

## 2018-08-22 NOTE — Assessment & Plan Note (Signed)
con't valtrex 1 g daily Pt states if he stops it he breaks out right away

## 2018-08-22 NOTE — Progress Notes (Signed)
Pre visit review using our clinic review tool, if applicable. No additional management support is needed unless otherwise documented below in the visit note. 

## 2018-08-22 NOTE — Assessment & Plan Note (Signed)
ghm utd  Pt wants to wait on shingrix  tdap given today  Flu - pt had previously See AVs Drs day labs reviewed--- to be abstracted in

## 2018-08-23 LAB — VITAMIN B12: Vitamin B-12: 348 pg/mL (ref 211–911)

## 2018-08-23 LAB — TSH: TSH: 1.2 u[IU]/mL (ref 0.35–4.50)

## 2018-08-23 LAB — VITAMIN D 25 HYDROXY (VIT D DEFICIENCY, FRACTURES): VITD: 35.99 ng/mL (ref 30.00–100.00)

## 2018-08-24 ENCOUNTER — Encounter: Payer: Self-pay | Admitting: Family Medicine

## 2018-09-14 MED FILL — AMOX-CLAV 875-125 MG TABLET: 875-125 | 10 days supply | Qty: 20 | Fill #0

## 2018-10-09 ENCOUNTER — Telehealth: Payer: Self-pay

## 2018-10-09 MED ORDER — KETOCONAZOLE 2 % EX CREA: 1.0000 "application " | TOPICAL_CREAM | Freq: Two times a day (BID) | CUTANEOUS | 1 refills | Status: DC

## 2018-10-09 MED FILL — KETOCONAZOLE 2% CREAM: 2 | 7 days supply | Qty: 60 | Fill #0

## 2018-10-09 NOTE — Telephone Encounter (Signed)
Pt requesting Nizoral 2% bid. Okay for Dr. Lorelei Pont.

## 2018-11-08 DIAGNOSIS — G4733 Obstructive sleep apnea (adult) (pediatric): Secondary | ICD-10-CM | POA: Diagnosis not present

## 2018-11-10 MED FILL — FLUoxetine HCL 20 MG CAPS: 20 | 90 days supply | Qty: 90 | Fill #1

## 2018-12-07 ENCOUNTER — Ambulatory Visit: Payer: 59 | Admitting: Family Medicine

## 2018-12-07 DIAGNOSIS — M7742 Metatarsalgia, left foot: Secondary | ICD-10-CM | POA: Diagnosis not present

## 2018-12-07 NOTE — Progress Notes (Signed)
Procedure Note   Patient was fitted for a : standard, cushioned, semi-rigid orthotic. The orthotic was heated and afterward the patient patient seated position and molded The patient was positioned in subtalar neutral position and 10 degrees of ankle dorsiflexion in a weight bearing stance. After completion of molding, patient did have orthotic management The blank was ground to a stable position for weight bearing. Size:10 Base: Carbon fiber Additional Posting and Padding: 270/50 bilaterally The patient ambulated these, and they were very comfortable.

## 2018-12-07 NOTE — Assessment & Plan Note (Signed)
Custom orthotics in place today.  Last ones lasted across approximately 6 years.  Discussed with patient about getting aware of them over the course of time.  Patient will increase slowly.  Follow-up again 4 weeks

## 2018-12-22 ENCOUNTER — Ambulatory Visit (INDEPENDENT_AMBULATORY_CARE_PROVIDER_SITE_OTHER): Payer: 59 | Admitting: Family Medicine

## 2018-12-22 ENCOUNTER — Other Ambulatory Visit: Payer: Self-pay

## 2018-12-22 VITALS — BP 110/82 | HR 78 | Temp 98.5°F

## 2018-12-22 DIAGNOSIS — J4520 Mild intermittent asthma, uncomplicated: Secondary | ICD-10-CM

## 2018-12-22 DIAGNOSIS — M791 Myalgia, unspecified site: Secondary | ICD-10-CM | POA: Diagnosis not present

## 2018-12-22 DIAGNOSIS — J029 Acute pharyngitis, unspecified: Secondary | ICD-10-CM | POA: Diagnosis not present

## 2018-12-22 DIAGNOSIS — R05 Cough: Secondary | ICD-10-CM

## 2018-12-22 DIAGNOSIS — R0989 Other specified symptoms and signs involving the circulatory and respiratory systems: Secondary | ICD-10-CM | POA: Diagnosis not present

## 2018-12-22 DIAGNOSIS — B9789 Other viral agents as the cause of diseases classified elsewhere: Secondary | ICD-10-CM

## 2018-12-22 DIAGNOSIS — R059 Cough, unspecified: Secondary | ICD-10-CM

## 2018-12-22 DIAGNOSIS — R509 Fever, unspecified: Secondary | ICD-10-CM

## 2018-12-22 DIAGNOSIS — J988 Other specified respiratory disorders: Secondary | ICD-10-CM

## 2018-12-24 DIAGNOSIS — B9789 Other viral agents as the cause of diseases classified elsewhere: Secondary | ICD-10-CM | POA: Insufficient documentation

## 2018-12-24 DIAGNOSIS — J988 Other specified respiratory disorders: Secondary | ICD-10-CM

## 2018-12-24 NOTE — Assessment & Plan Note (Signed)
Has seen many patients with fevers and influenza type symptoms with influenza negative testing over the past few weeks. Now is noting a fever, dry cough, malaise and congestion. Is influenza negative. Novel Corona virus testing is performed.

## 2018-12-24 NOTE — Assessment & Plan Note (Signed)
No significant flare in wheezing with acute illness

## 2018-12-24 NOTE — Progress Notes (Signed)
Subjective:    Patient ID: Daniel Lam, male    DOB: [birth year], 58 y.o.   MRN: [id number]  No chief complaint on file.   HPI Patient is in today for evaluation of respiratory illness. Over the past several weeks he has seen and treated numerous patients with febrile flu like illness that tested positive for influenza. He has been feeling tired and mildly congested for a couple of days then last night he spiked a fever and became more acutely ill with malaise, sore throat and cough. No family members with similar symptoms and no recent travel.   Past Medical History:  Diagnosis Date  . Anxiety state, unspecified   . Atypical mole 2008  . Complication of anesthesia    had some difficulty voiding  . Dermatitis    anal dermatitis  . Hemorrhoids   . Herpes simplex without mention of complication   . 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 Numbness and tingling   . Other and unspecified anterior pituitary hyperfunction   . Sleep apnea   . Unspecified asthma(493.90)    controlled    Past Surgical History:  Procedure Laterality Date  . APPENDECTOMY    . KNEE ARTHROSCOPY  09/01/2011   Procedure: ARTHROSCOPY KNEE;  Surgeon: Alta Corning;  Location: [facility];  Service: Orthopedics;  Laterality: Left;  medial and lateral meniscus tear debridment  and chondroplasty  . LASIK    . ORCHIECTOMY     Lt. 08/2009    Family History  Problem Relation Age of Onset  . Sjogren's syndrome Mother   . COPD Mother   . Colon polyps Father   . Dementia Father   . Throat cancer Father   . Melanoma Brother   . Throat cancer Unknown        Larynx  . Colon cancer Neg Hx   . Esophageal cancer Neg Hx   . Stomach cancer Neg Hx   . Rectal cancer Neg Hx     Social History   Socioeconomic History  . Marital status: Married    Spouse name: Not on file  . Number of children: 3  . Years of education: MD  . Highest education level:  Not on file  Occupational History  . Occupation: physician    Comment: internal medicine  Social Needs  . Financial resource strain: Not on file  . Food insecurity:    Worry: Not on file    Inability: Not on file  . Transportation needs:    Medical: Not on file    Non-medical: Not on file  Tobacco Use  . Smoking status: Current Some Day Smoker    Packs/day: 0.25    Types: Cigarettes, Cigars  . Smokeless tobacco: Never Used  . Tobacco comment: Occasional cigar  Substance and Sexual Activity  . Alcohol use: Yes    Alcohol/week: 0.0 standard drinks    Comment: occ  . Drug use: No  . Sexual activity: Yes    Partners: Female  Lifestyle  . Physical activity:    Days per week: Not on file    Minutes per session: Not on file  . Stress: Not on file  Relationships  . Social connections:    Talks on phone: Not on file    Gets together: Not on file    Attends religious service: Not on file    Active member of club or organization: Not on file  Attends meetings of clubs or organizations: Not on file    Relationship status: Not on file  . Intimate partner violence:    Fear of current or ex partner: Not on file    Emotionally abused: Not on file    Physically abused: Not on file    Forced sexual activity: Not on file  Other Topics Concern  . Not on file  Social History Narrative   Regular exercise: yes, very active   Caffeine use: 2 cups of coffee and soda's daily.   Right-handed.    Outpatient Medications Prior to Visit  Medication Sig Dispense Refill  . acetaminophen (TYLENOL) 500 MG tablet Take 500 mg by mouth every 6 (six) hours as needed.    Marland Kitchen albuterol (VENTOLIN HFA) 108 (90 Base) MCG/ACT inhaler INHALE 2 PUFFS BY MOUTH INTO THE LUNGS 4 TIMES DAILY. 18 g 11  . ALPRAZolam (XANAX) 0.5 MG tablet Take 0.5 mg by mouth at bedtime as needed.      Marland Kitchen AUVI-Q 0.3 MG/0.3ML SOAJ injection     . azelastine (ASTELIN) 0.1 % nasal spray Place 2 sprays into both nostrils 2 (two) times  daily. Use in each nostril as directed (Patient not taking: Reported on 08/22/2018) 30 mL 12  . budesonide-formoterol (SYMBICORT) 160-4.5 MCG/ACT inhaler Inhale 2 puffs into the lungs 2 (two) times daily. 1 Inhaler 3  . FLUoxetine (PROZAC) 20 MG capsule Take 1 capsule (20 mg total) by mouth daily. 90 capsule 3  . fluticasone (CUTIVATE) 0.005 % ointment APPLY 1 APPLICATION TOPICALLY 2 TIMES DAILY. 30 g 3  . ibuprofen (ADVIL,MOTRIN) 200 MG tablet Take 200 mg by mouth every 6 (six) hours as needed.    Marland Kitchen ketoconazole (NIZORAL) 2 % cream Apply 1 application topically 2 (two) times daily. 60 g 1  . lidocaine (LINDAMANTLE) 3 % CREA cream Apply 1 application topically as needed. (Patient not taking: Reported on 08/22/2018) 85 g 3  . lidocaine (XYLOCAINE) 5 % ointment Apply 1 application topically as needed. (Patient not taking: Reported on 08/22/2018) 30 g 0  . Minoxidil (ROGAINE MENS EX) Apply topically 2 (two) times daily.    . montelukast (SINGULAIR) 10 MG tablet Take 1 tablet (10 mg total) by mouth at bedtime. 30 tablet 5  . NONFORMULARY OR COMPOUNDED ITEM RESMED Standard Air Tubing for CPAP 1 each 11  . NONFORMULARY OR COMPOUNDED ITEM ResMed Swift FX nasal pillows mask (Medium) 1 each 11  . pseudoephedrine (SUDAFED) 60 MG tablet Take 1 tablet (60 mg total) by mouth every 4 (four) hours as needed for congestion. 30 tablet 0  . valACYclovir (VALTREX) 1000 MG tablet Take 1 tablet (1,000 mg total) by mouth daily. 90 tablet 3  . Vitamin D, Ergocalciferol, (DRISDOL) 50000 units CAPS capsule Take 1 capsule (50,000 Units total) by mouth every 7 (seven) days. 4 capsule 2   No facility-administered medications prior to visit.     Allergies  Allergen Reactions  . Demerol [Meperidine Hcl]     Difficulty voiding  . Food     Mammals- Alpha Gal Syndrome  . Latex     REACTION: SOB, rash, itching  . Morphine And Related     Difficulty voiding    Review of Systems  Constitutional: Positive for fever and  malaise/fatigue.  HENT: Positive for congestion and sore throat.   Eyes: Negative for blurred vision.  Respiratory: Positive for cough. Negative for shortness of breath.   Cardiovascular: Negative for chest pain, palpitations and leg swelling.  Gastrointestinal:  Negative for abdominal pain, blood in stool and nausea.  Genitourinary: Negative for dysuria and frequency.  Musculoskeletal: Negative for falls.  Skin: Negative for rash.  Neurological: Negative for dizziness, loss of consciousness and headaches.  Endo/Heme/Allergies: Negative for environmental allergies.  Psychiatric/Behavioral: Negative for depression. The patient is not nervous/anxious.        Objective:    Physical Exam Vitals signs and nursing note reviewed.  Constitutional:      General: He is not in acute distress.    Appearance: He is well-developed.  HENT:     Head: Normocephalic and atraumatic.     Nose: Nose normal.  Eyes:     General:        Right eye: No discharge.        Left eye: No discharge.  Neck:     Musculoskeletal: Normal range of motion and neck supple.  Cardiovascular:     Rate and Rhythm: Normal rate and regular rhythm.     Heart sounds: No murmur.  Pulmonary:     Effort: Pulmonary effort is normal.     Breath sounds: Normal breath sounds.  Abdominal:     General: Bowel sounds are normal.     Palpations: Abdomen is soft.     Tenderness: There is no abdominal tenderness.  Skin:    General: Skin is warm and dry.  Neurological:     Mental Status: He is alert and oriented to person, place, and time.     BP 110/82 (BP Location: Left Arm, Patient Position: Sitting, Cuff Size: Normal)   Pulse 78   Temp 98.5 F (36.9 C) (Oral)  Wt Readings from Last 3 Encounters:  08/22/18 210 lb (95.3 kg)  09/06/17 221 lb 9.6 oz (100.5 kg)  08/18/17 221 lb (100.2 kg)     Lab Results  Component Value Date   WBC 4.3 01/17/2018   HGB 14.1 01/17/2018   HCT 40.2 01/17/2018   PLT 196 01/17/2018    GLUCOSE 104 (H) 01/17/2018   CHOL 183 01/17/2018   TRIG 104 01/17/2018   HDL 59 01/17/2018   LDLDIRECT 119.0 11/27/2015   LDLCALC 104 (H) 01/17/2018   ALT 40 01/17/2018   AST 28 01/17/2018   NA 137 01/17/2018   K 3.9 01/17/2018   CL 104 01/17/2018   CREATININE 1.14 01/17/2018   BUN 19 01/17/2018   CO2 27 01/17/2018   TSH 1.20 08/22/2018   PSA 0.5 01/17/2018   HGBA1C 5.5 08/17/2016    Lab Results  Component Value Date   TSH 1.20 08/22/2018   Lab Results  Component Value Date   WBC 4.3 01/17/2018   HGB 14.1 01/17/2018   HCT 40.2 01/17/2018   MCV 94.8 01/17/2018   PLT 196 01/17/2018   Lab Results  Component Value Date   NA 137 01/17/2018   K 3.9 01/17/2018   CO2 27 01/17/2018   GLUCOSE 104 (H) 01/17/2018   BUN 19 01/17/2018   CREATININE 1.14 01/17/2018   BILITOT 0.4 01/17/2018   ALKPHOS 40 01/17/2018   AST 28 01/17/2018   ALT 40 01/17/2018   PROT 6.3 01/17/2018   ALBUMIN 4.3 09/06/2017   CALCIUM 9.2 01/17/2018   GFR 83.87 08/17/2016   Lab Results  Component Value Date   CHOL 183 01/17/2018   Lab Results  Component Value Date   HDL 59 01/17/2018   Lab Results  Component Value Date   LDLCALC 104 (H) 01/17/2018   Lab Results  Component Value Date  TRIG 104 01/17/2018   Lab Results  Component Value Date   CHOLHDL 3.1 01/17/2018   Lab Results  Component Value Date   HGBA1C 5.5 08/17/2016       Assessment & Plan:   Problem List Items Addressed This Visit    Asthma    No significant flare in wheezing with acute illness      Viral respiratory illness    Has seen many patients with fevers and influenza type symptoms with influenza negative testing over the past few weeks. Now is noting a fever, dry cough, malaise and congestion. Is influenza negative. Novel Corona virus testing is performed.        Other Visit Diagnoses    Fever, unspecified fever cause    -  Primary   Relevant Orders   POCT Influenza A/B   Novel Coronavirus 2019    Myalgia       Relevant Orders   POCT Influenza A/B   Novel Coronavirus 2019   Runny nose       Relevant Orders   Novel Coronavirus 2019   Sore throat       Relevant Orders   Novel Coronavirus 2019   Cough       Relevant Orders   Novel Coronavirus 2019      I am having Dr. Alda Berthold. Jeon maintain his ALPRAZolam, acetaminophen, ibuprofen, NONFORMULARY OR COMPOUNDED ITEM, NONFORMULARY OR COMPOUNDED ITEM, Vitamin D (Ergocalciferol), lidocaine, Minoxidil (ROGAINE MENS EX), lidocaine, montelukast, Auvi-Q, azelastine, fluticasone, FLUoxetine, budesonide-formoterol, albuterol, pseudoephedrine, valACYclovir, and ketoconazole.  No orders of the defined types were placed in this encounter.    Penni Homans, MD

## 2018-12-25 LAB — POCT INFLUENZA A/B
Influenza A, POC: NEGATIVE
Influenza B, POC: NEGATIVE

## 2018-12-26 LAB — SARS-COV-2 RNA, QUALITATIVE REAL-TIME RT-PCR: SARS CoV 2 RNA, RT PCR: NOT DETECTED

## 2019-01-01 ENCOUNTER — Other Ambulatory Visit: Payer: Self-pay | Admitting: Family Medicine

## 2019-01-01 ENCOUNTER — Other Ambulatory Visit: Payer: Self-pay | Admitting: Allergy and Immunology

## 2019-01-01 MED ORDER — MONTELUKAST SODIUM 10 MG PO TABS: 10.0000 mg | ORAL_TABLET | Freq: Every day | ORAL | 3 refills | Status: DC

## 2019-01-01 MED FILL — VENTOLIN HFA 90 MCG INHALER: 108 (90 BAS | 17 days supply | Qty: 18 | Fill #1

## 2019-01-01 MED FILL — MONTELUKAST SOD 10 MG TAB: 10 | 90 days supply | Qty: 90 | Fill #0

## 2019-01-16 ENCOUNTER — Other Ambulatory Visit: Payer: Self-pay | Admitting: Family Medicine

## 2019-01-16 MED ORDER — CROMOLYN SODIUM 4 % OP SOLN: 2.0000 [drp] | Freq: Four times a day (QID) | OPHTHALMIC | 12 refills | Status: DC

## 2019-01-16 MED ORDER — PREDNISOLONE ACETATE 1 % OP SUSP: 2.0000 [drp] | Freq: Four times a day (QID) | OPHTHALMIC | 0 refills | Status: DC

## 2019-01-16 MED FILL — PREDNISOLONE AC 1% EYE DROP: 1 | 6 days supply | Qty: 5 | Fill #0

## 2019-01-16 MED FILL — CROMOLYN 4% EYE DROPS: 4 | 13 days supply | Qty: 10 | Fill #0

## 2019-02-02 ENCOUNTER — Other Ambulatory Visit: Payer: Self-pay | Admitting: Family Medicine

## 2019-02-02 DIAGNOSIS — R1013 Epigastric pain: Secondary | ICD-10-CM

## 2019-02-02 MED ORDER — PANTOPRAZOLE SODIUM 40 MG PO TBEC: 40.0000 mg | DELAYED_RELEASE_TABLET | Freq: Every day | ORAL | 3 refills | Status: DC

## 2019-02-02 MED FILL — PANTOPRAZOLE SOD DR 40 MG T: 40 | 90 days supply | Qty: 90 | Fill #0

## 2019-02-07 MED FILL — FLUoxetine HCL 20 MG CAPS: 20 | 90 days supply | Qty: 90 | Fill #2

## 2019-04-02 ENCOUNTER — Ambulatory Visit (INDEPENDENT_AMBULATORY_CARE_PROVIDER_SITE_OTHER): Payer: 59 | Admitting: Family Medicine

## 2019-04-02 ENCOUNTER — Other Ambulatory Visit: Payer: Self-pay

## 2019-04-02 ENCOUNTER — Encounter: Payer: Self-pay | Admitting: Family Medicine

## 2019-04-02 DIAGNOSIS — M545 Low back pain, unspecified: Secondary | ICD-10-CM

## 2019-04-02 MED ORDER — PREDNISONE 10 MG PO TABS: ORAL_TABLET | ORAL | 0 refills | Status: DC

## 2019-04-02 NOTE — Progress Notes (Signed)
Virtual Visit via Video Note  I connected with CAIDON FOTI on 04/02/19 at  9:45 AM EDT by a video enabled telemedicine application and verified that I am speaking with the correct person using two identifiers.  Location: Patient: home  Provider: home    I discussed the limitations of evaluation and management by telemedicine and the availability of in person appointments. The patient expressed understanding and agreed to proceed.  History of Present Illness: Pt is home c/o back pain .    Pt c/o R side hurting--he was hiking sat and felt fine Sun but then cleaned his garage out and pain came on suddenly --- pt had crawl back to house from garage  + muscle spasms,   No    Observations/Objective: No vitals obtained  Pt in NAD Assessment and Plan: 1. Acute midline low back pain without sciatica con't to use ibuprofen / tylenol Moist heat pred taper  Call or rto prn  - predniSONE (DELTASONE) 10 MG tablet; TAKE 4 TABLETS PO QD FOR 3 DAYS THEN TAKE 3 TABLETS PO QD FOR 3 DAYS THEN TAKE 2 TABLET PO QD FOR 3 DAYS THEN TAKE 11 TAB PO QD FOR 3 DAYS then 1/2 po qd for 3 days  Dispense: 35 tablet; Refill: 0   Follow Up Instructions:    I discussed the assessment and treatment plan with the patient. The patient was provided an opportunity to ask questions and all were answered. The patient agreed with the plan and demonstrated an understanding of the instructions.   The patient was advised to call back or seek an in-person evaluation if the symptoms worsen or if the condition fails to improve as anticipated.  I provided 15 minutes of non-face-to-face time during this encounter.   Ann Held, DO

## 2019-04-09 ENCOUNTER — Other Ambulatory Visit: Payer: Self-pay | Admitting: Family Medicine

## 2019-04-09 DIAGNOSIS — M79674 Pain in right toe(s): Secondary | ICD-10-CM

## 2019-04-10 MED FILL — valACYclovir HCL 1 GM TABS: 1 | 90 days supply | Qty: 90 | Fill #1

## 2019-04-12 ENCOUNTER — Ambulatory Visit: Payer: 59

## 2019-04-12 ENCOUNTER — Encounter: Payer: Self-pay | Admitting: Podiatry

## 2019-04-12 ENCOUNTER — Other Ambulatory Visit: Payer: Self-pay

## 2019-04-12 ENCOUNTER — Ambulatory Visit: Payer: 59 | Admitting: Podiatry

## 2019-04-12 VITALS — BP 129/77 | HR 76 | Temp 97.7°F | Resp 16

## 2019-04-12 DIAGNOSIS — M2041 Other hammer toe(s) (acquired), right foot: Secondary | ICD-10-CM | POA: Diagnosis not present

## 2019-04-24 NOTE — Progress Notes (Signed)
Subjective:   Patient ID: Daniel Lam, male   DOB: 58 y.o.   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  He is noticing the right fourth toes turning under some but not causing any issues.  He denies any redness or drainage to the area of the toe.  Points to the lateral aspect the right fifth toe just adjacent the nail overlying a callus where he gets the majority discomfort in the nails tendon.  No recent injury.   Review of Systems  All other systems reviewed and are negative.  Past Medical History:  Diagnosis Date  . Anxiety state, unspecified   . Atypical mole 2008  . Complication of anesthesia    had some difficulty voiding  . Dermatitis    anal dermatitis  . Hemorrhoids   . Herpes simplex without mention of complication   . 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 Numbness and tingling   . Other and unspecified anterior pituitary hyperfunction   . Sleep apnea   . Unspecified asthma(493.90)    controlled    Past Surgical History:  Procedure Laterality Date  . APPENDECTOMY    . KNEE ARTHROSCOPY  09/01/2011   Procedure: ARTHROSCOPY KNEE;  Surgeon: Alta Corning;  Location: [facility];  Service: Orthopedics;  Laterality: Left;  medial and lateral meniscus tear debridment  and chondroplasty  . LASIK    . ORCHIECTOMY     Lt. 08/2009     Current Outpatient Medications:  .  acetaminophen (TYLENOL) 500 MG tablet, Take 500 mg by mouth every 6 (six) hours as needed., Disp: , Rfl:  .  albuterol (VENTOLIN HFA) 108 (90 Base) MCG/ACT inhaler, INHALE 2 PUFFS BY MOUTH INTO THE LUNGS 4 TIMES DAILY., Disp: 18 g, Rfl: 11 .  ALPRAZolam (XANAX) 0.5 MG tablet, Take 0.5 mg by mouth at bedtime as needed.  , Disp: , Rfl:  .  AUVI-Q 0.3 MG/0.3ML SOAJ injection, , Disp: , Rfl:  .  azelastine (ASTELIN) 0.1 % nasal spray, Place 2 sprays  into both nostrils 2 (two) times daily. Use in each nostril as directed (Patient not taking: Reported on 08/22/2018), Disp: 30 mL, Rfl: 12 .  budesonide-formoterol (SYMBICORT) 160-4.5 MCG/ACT inhaler, Inhale 2 puffs into the lungs 2 (two) times daily., Disp: 1 Inhaler, Rfl: 3 .  cromolyn (OPTICROM) 4 % ophthalmic solution, Place 2 drops into both eyes 4 (four) times daily., Disp: 10 mL, Rfl: 12 .  FLUoxetine (PROZAC) 20 MG capsule, Take 1 capsule (20 mg total) by mouth daily., Disp: 90 capsule, Rfl: 3 .  fluticasone (CUTIVATE) 0.005 % ointment, APPLY 1 APPLICATION TOPICALLY 2 TIMES DAILY., Disp: 30 g, Rfl: 3 .  ibuprofen (ADVIL,MOTRIN) 200 MG tablet, Take 200 mg by mouth every 6 (six) hours as needed., Disp: , Rfl:  .  ketoconazole (NIZORAL) 2 % cream, Apply 1 application topically 2 (two) times daily., Disp: 60 g, Rfl: 1 .  lidocaine (LINDAMANTLE) 3 % CREA cream, Apply 1 application topically as needed. (Patient not taking: Reported on 08/22/2018), Disp: 85 g, Rfl: 3 .  lidocaine (XYLOCAINE) 5 % ointment, Apply 1 application topically as needed. (Patient not taking: Reported on 08/22/2018), Disp: 30 g, Rfl: 0 .  Minoxidil (ROGAINE MENS EX), Apply topically 2 (two) times daily., Disp: , Rfl:  .  montelukast (SINGULAIR)  10 MG tablet, Take 1 tablet (10 mg total) by mouth at bedtime., Disp: 90 tablet, Rfl: 3 .  NONFORMULARY OR COMPOUNDED ITEM, RESMED Standard Air Tubing for CPAP, Disp: 1 each, Rfl: 11 .  NONFORMULARY OR COMPOUNDED ITEM, ResMed Swift FX nasal pillows mask (Medium), Disp: 1 each, Rfl: 11 .  pantoprazole (PROTONIX) 40 MG tablet, Take 1 tablet (40 mg total) by mouth daily., Disp: 90 tablet, Rfl: 3 .  prednisoLONE acetate (PRED FORTE) 1 % ophthalmic suspension, Place 2 drops into both eyes 4 (four) times daily., Disp: 5 mL, Rfl: 0 .  predniSONE (DELTASONE) 10 MG tablet, TAKE 4 TABLETS PO QD FOR 3 DAYS THEN TAKE 3 TABLETS PO QD FOR 3 DAYS THEN TAKE 2 TABLET PO QD FOR 3 DAYS THEN TAKE 11 TAB PO  QD FOR 3 DAYS then 1/2 po qd for 3 days, Disp: 35 tablet, Rfl: 0 .  pseudoephedrine (SUDAFED) 60 MG tablet, Take 1 tablet (60 mg total) by mouth every 4 (four) hours as needed for congestion., Disp: 30 tablet, Rfl: 0 .  valACYclovir (VALTREX) 1000 MG tablet, Take 1 tablet (1,000 mg total) by mouth daily., Disp: 90 tablet, Rfl: 3 .  Vitamin D, Ergocalciferol, (DRISDOL) 50000 units CAPS capsule, Take 1 capsule (50,000 Units total) by mouth every 7 (seven) days., Disp: 4 capsule, Rfl: 2  Allergies  Allergen Reactions  . Demerol [Meperidine Hcl]     Difficulty voiding  . Food     Mammals- Alpha Gal Syndrome  . Latex     REACTION: SOB, rash, itching  . Morphine And Related     Difficulty voiding          Objective:  Physical Exam  General: AAO x3, NAD  Dermatological: There is a thick hyperkeratotic tissue just adjacent to the fifth digit toenail lateral aspect.  Upon debridement there is no ongoing ulceration, drainage or any clinical signs of infection noted today.  There is no open lesions.  Vascular: Dorsalis Pedis artery and Posterior Tibial artery pedal pulses are 2/4 bilateral with immedate capillary fill time.  There is no pain with calf compression, swelling, warmth, erythema.   Neruologic: Grossly intact via light touch bilateral.  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  The fourth toe is starting to drift as well.  Muscular strength 5/5 in all groups tested bilateral.  Gait: Unassisted, Nonantalgic.     Assessment:   Hyperkeratotic lesion right 5th digit due to digital deformity     Plan:  -Treatment options discussed including all alternatives, risks, and complications -Etiology of symptoms were discussed -Debrided hyperkeratotic tissue to the any complications or bleeding.  Offloading pads dispensed.  Long-term can consider surgical intervention if needed.    Trula Slade DPM

## 2019-05-10 MED FILL — PANTOPRAZOLE SOD DR 40 MG T: 40 | 90 days supply | Qty: 90 | Fill #1

## 2019-05-10 MED FILL — FLUoxetine HCL 20 MG CAPS: 20 | 90 days supply | Qty: 90 | Fill #3

## 2019-06-11 ENCOUNTER — Other Ambulatory Visit: Payer: Self-pay | Admitting: Psychiatry

## 2019-06-11 ENCOUNTER — Telehealth: Payer: Self-pay | Admitting: Psychiatry

## 2019-06-11 MED ORDER — ALPRAZOLAM 0.5 MG PO TABS: 0.5000 mg | ORAL_TABLET | Freq: Three times a day (TID) | ORAL | 1 refills | Status: DC | PRN

## 2019-06-11 MED FILL — ALPRAZOLAM 0.5 MG TABS: 0.5 | 90 days supply | Qty: 270 | Fill #0

## 2019-06-11 NOTE — Telephone Encounter (Signed)
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  He says Dr. Clovis Pu usually prescribes it as TID, not 1 q day.  And his does #270 which of course will last him a while.  Please send in the corrected script to [facility] in [facility].

## 2019-07-16 ENCOUNTER — Other Ambulatory Visit: Payer: Self-pay | Admitting: Occupational Medicine

## 2019-07-17 LAB — LIPID PANEL
Cholesterol: 182 mg/dL (ref <200)
HDL: 64 mg/dL (ref >=40)
LDL Cholesterol (Calc): 101 mg/dL (calc) — ABNORMAL HIGH
Non-HDL Cholesterol (Calc): 118 mg/dL (calc) (ref <130)
Total CHOL/HDL Ratio: 2.8 (calc) (ref <5.0)
Triglycerides: 77 mg/dL (ref <150)

## 2019-07-17 LAB — COMPLETE METABOLIC PANEL WITH GFR
AG Ratio: 1.7 (calc) (ref 1.0–2.5)
ALT: 164 U/L — ABNORMAL HIGH (ref 9–46)
AST: 95 U/L — ABNORMAL HIGH (ref 10–35)
Albumin: 4.2 g/dL (ref 3.6–5.1)
Alkaline phosphatase (APISO): 84 U/L (ref 35–144)
BUN: 21 mg/dL (ref 7–25)
CO2: 26 mmol/L (ref 20–32)
Calcium: 9.2 mg/dL (ref 8.6–10.3)
Chloride: 104 mmol/L (ref 98–110)
Creat: 1.01 mg/dL (ref 0.70–1.33)
GFR, Est African American: 96 mL/min/{1.73_m2} (ref >=60)
GFR, Est Non African American: 83 mL/min/{1.73_m2} (ref >=60)
Globulin: 2.5 g/dL (calc) (ref 1.9–3.7)
Glucose, Bld: 96 mg/dL (ref 65–99)
Potassium: 4 mmol/L (ref 3.5–5.3)
Sodium: 139 mmol/L (ref 135–146)
Total Bilirubin: 0.5 mg/dL (ref 0.2–1.2)
Total Protein: 6.7 g/dL (ref 6.1–8.1)

## 2019-07-17 LAB — CBC WITH DIFFERENTIAL/PLATELET
Absolute Monocytes: 451 cells/uL (ref 200–950)
Basophils Absolute: 29 cells/uL (ref 0–200)
Basophils Relative: 0.6 %
Eosinophils Absolute: 120 cells/uL (ref 15–500)
Eosinophils Relative: 2.5 %
HCT: 41.1 % (ref 38.5–50.0)
Hemoglobin: 13.9 g/dL (ref 13.2–17.1)
Lymphs Abs: 1258 cells/uL (ref 850–3900)
MCH: 32.1 pg (ref 27.0–33.0)
MCHC: 33.8 g/dL (ref 32.0–36.0)
MCV: 94.9 fL (ref 80.0–100.0)
MPV: 10.4 fL (ref 7.5–12.5)
Monocytes Relative: 9.4 %
Neutro Abs: 2942 cells/uL (ref 1500–7800)
Neutrophils Relative %: 61.3 %
Platelets: 198 10*3/uL (ref 140–400)
RBC: 4.33 10*6/uL (ref 4.20–5.80)
RDW: 12.1 % (ref 11.0–15.0)
Total Lymphocyte: 26.2 %
WBC: 4.8 10*3/uL (ref 3.8–10.8)

## 2019-07-17 LAB — PSA: PSA: 0.4 ng/mL (ref <=4.0)

## 2019-07-24 ENCOUNTER — Ambulatory Visit: Payer: 59 | Admitting: Psychiatry

## 2019-07-26 ENCOUNTER — Other Ambulatory Visit: Payer: Self-pay

## 2019-07-26 ENCOUNTER — Encounter: Payer: Self-pay | Admitting: Psychiatry

## 2019-07-26 ENCOUNTER — Ambulatory Visit (INDEPENDENT_AMBULATORY_CARE_PROVIDER_SITE_OTHER): Payer: 59 | Admitting: Psychiatry

## 2019-07-26 DIAGNOSIS — F411 Generalized anxiety disorder: Secondary | ICD-10-CM

## 2019-07-26 MED ORDER — BUPROPION HCL ER (XL) 150 MG PO TB24: ORAL_TABLET | ORAL | 0 refills | Status: DC

## 2019-07-26 MED FILL — buPROPion HCL ER (XL) 150 M: 150 | 19 days supply | Qty: 30 | Fill #0

## 2019-07-26 NOTE — Progress Notes (Signed)
HAVIS MCSWEENEY [id number] 03-22-1961 58 y.o.  Subjective:   Patient ID:  Daniel Lam is a 58 y.o. (DOB [birth year]) male.  Chief Complaint:  Chief Complaint  Patient presents with  . Follow-up    Medication Management  . Anxiety    Medication Management    HPI Daniel Lam presents to the office today for follow-up of GAD.  Last seen a year ago.  No sig changes.  Increased xanax a little over time.  Needed more Xanax to sleep 0.75 mg HS.  Anxiety is manageable.  Mood and otherwise are the same.  Very distant with wife unchanged.  Thought about divorce but that's not good either.  Don't feel attracted to her so avoids her.  Doesn't think meds will fix this.  Past Psychiatric Medication Trials: Trintellix SE,  Fluoxetine, sertraline, citalopram, paroxetine, venlafaxine constipation, Xanax  Review of Systems:  Review of Systems  Neurological: Negative for tremors and weakness.    Medications: I have reviewed the patient's current medications.  Current Outpatient Medications  Medication Sig Dispense Refill  . acetaminophen (TYLENOL) 500 MG tablet Take 500 mg by mouth every 6 (six) hours as needed.    Marland Kitchen albuterol (VENTOLIN HFA) 108 (90 Base) MCG/ACT inhaler INHALE 2 PUFFS BY MOUTH INTO THE LUNGS 4 TIMES DAILY. 18 g 11  . ALPRAZolam (XANAX) 0.5 MG tablet Take 1 tablet (0.5 mg total) by mouth 3 (three) times daily as needed. 270 tablet 1  . AUVI-Q 0.3 MG/0.3ML SOAJ injection     . budesonide-formoterol (SYMBICORT) 160-4.5 MCG/ACT inhaler Inhale 2 puffs into the lungs 2 (two) times daily. 1 Inhaler 3  . cromolyn (OPTICROM) 4 % ophthalmic solution Place 2 drops into both eyes 4 (four) times daily. 10 mL 12  . FLUoxetine (PROZAC) 20 MG capsule Take 1 capsule (20 mg total) by mouth daily. 90 capsule 3  . fluticasone (CUTIVATE) 0.005 % ointment APPLY 1 APPLICATION TOPICALLY 2 TIMES DAILY. 30 g 3  . ibuprofen (ADVIL,MOTRIN) 200 MG tablet Take 200 mg by mouth every 6 (six) hours as needed.    Marland Kitchen  ketoconazole (NIZORAL) 2 % cream Apply 1 application topically 2 (two) times daily. 60 g 1  . lidocaine (LINDAMANTLE) 3 % CREA cream Apply 1 application topically as needed. (Patient not taking: Reported on 08/22/2018) 85 g 3  . lidocaine (XYLOCAINE) 5 % ointment Apply 1 application topically as needed. (Patient not taking: Reported on 08/22/2018) 30 g 0  . montelukast (SINGULAIR) 10 MG tablet Take 1 tablet (10 mg total) by mouth at bedtime. 90 tablet 3  . NONFORMULARY OR COMPOUNDED ITEM RESMED Standard Air Tubing for CPAP 1 each 11  . NONFORMULARY OR COMPOUNDED ITEM ResMed Swift FX nasal pillows mask (Medium) 1 each 11  . pantoprazole (PROTONIX) 40 MG tablet Take 1 tablet (40 mg total) by mouth daily. 90 tablet 3  . prednisoLONE acetate (PRED FORTE) 1 % ophthalmic suspension Place 2 drops into both eyes 4 (four) times daily. 5 mL 0  . predniSONE (DELTASONE) 10 MG tablet TAKE 4 TABLETS PO QD FOR 3 DAYS THEN TAKE 3 TABLETS PO QD FOR 3 DAYS THEN TAKE 2 TABLET PO QD FOR 3 DAYS THEN TAKE 11 TAB PO QD FOR 3 DAYS then 1/2 po qd for 3 days 35 tablet 0  . pseudoephedrine (SUDAFED) 60 MG tablet Take 1 tablet (60 mg total) by mouth every 4 (four) hours as needed for congestion. 30 tablet 0  . valACYclovir (VALTREX) 1000 MG  tablet Take 1 tablet (1,000 mg total) by mouth daily. 90 tablet 3  . Vitamin D, Ergocalciferol, (DRISDOL) 50000 units CAPS capsule Take 1 capsule (50,000 Units total) by mouth every 7 (seven) days. 4 capsule 2   No current facility-administered medications for this visit.     Medication Side Effects: Other: libido     Allergies:  Allergies  Allergen Reactions  . Demerol [Meperidine Hcl]     Difficulty voiding  . Food     Mammals- Alpha Gal Syndrome  . Latex     REACTION: SOB, rash, itching  . Morphine And Related     Difficulty voiding    Past Medical History:  Diagnosis Date  . Anxiety state, unspecified   . Atypical mole 2008  . Complication of anesthesia    had some  difficulty voiding  . Dermatitis    anal dermatitis  . Hemorrhoids   . Herpes simplex without mention of complication   . 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 Numbness and tingling   . Other and unspecified anterior pituitary hyperfunction   . Sleep apnea   . Unspecified asthma(493.90)    controlled    Family History  Problem Relation Age of Onset  . Sjogren's syndrome Mother   . COPD Mother   . Colon polyps Father   . Dementia Father   . Throat cancer Father   . Melanoma Brother   . Throat cancer Unknown        Larynx  . Colon cancer Neg Hx   . Esophageal cancer Neg Hx   . Stomach cancer Neg Hx   . Rectal cancer Neg Hx     Social History   Socioeconomic History  . Marital status: Married    Spouse name: Not on file  . Number of children: 3  . Years of education: MD  . Highest education level: Not on file  Occupational History  . Occupation: physician    Comment: internal medicine  Social Needs  . Financial resource strain: Not on file  . Food insecurity    Worry: Not on file    Inability: Not on file  . Transportation needs    Medical: Not on file    Non-medical: Not on file  Tobacco Use  . Smoking status: Current Some Day Smoker    Packs/day: 0.25    Types: Cigarettes, Cigars  . Smokeless tobacco: Never Used  . Tobacco comment: Occasional cigar  Substance and Sexual Activity  . Alcohol use: Yes    Alcohol/week: 0.0 standard drinks    Comment: occ  . Drug use: No  . Sexual activity: Yes    Partners: Female  Lifestyle  . Physical activity    Days per week: Not on file    Minutes per session: Not on file  . Stress: Not on file  Relationships  . Social Herbalist on phone: Not on file    Gets together: Not on file    Attends religious service: Not on file    Active member of club or organization: Not on file    Attends meetings of clubs or organizations: Not on file    Relationship  status: Not on file  . Intimate partner violence    Fear of current or ex partner: Not on file    Emotionally abused: Not on file    Physically abused: Not on file    Forced sexual activity: Not  on file  Other Topics Concern  . Not on file  Social History Narrative   Regular exercise: yes, very active   Caffeine use: 2 cups of coffee and soda's daily.   Right-handed.    Past Medical History, Surgical history, Social history, and Family history were reviewed and updated as appropriate.   Please see review of systems for further details on the patient's review from today.   Objective:   Physical Exam:  There were no vitals taken for this visit.  Physical Exam Constitutional:      General: He is not in acute distress.    Appearance: He is well-developed.  Musculoskeletal:        General: No deformity.  Neurological:     Mental Status: He is alert and oriented to person, place, and time.     Coordination: Coordination normal.  Psychiatric:        Attention and Perception: Attention and perception normal. He does not perceive auditory or visual hallucinations.        Mood and Affect: Mood normal. Mood is not anxious or depressed. Affect is not labile, blunt, angry or inappropriate.        Speech: Speech normal.        Behavior: Behavior normal.        Thought Content: Thought content normal. Thought content does not include homicidal or suicidal ideation. Thought content does not include homicidal or suicidal plan.        Cognition and Memory: Cognition and memory normal.        Judgment: Judgment normal.     Comments: Insight intact. No delusions.      Lab Review:     Component Value Date/Time   NA 139 07/16/2019 1228   NA 137 01/17/2018   K 4.0 07/16/2019 1228   CL 104 07/16/2019 1228   CO2 26 07/16/2019 1228   GLUCOSE 96 07/16/2019 1228   BUN 21 07/16/2019 1228   BUN 19 01/17/2018   CREATININE 1.01 07/16/2019 1228   CALCIUM 9.2 07/16/2019 1228   PROT 6.7  07/16/2019 1228   PROT 6.4 09/06/2017 1528   ALBUMIN 4.3 09/06/2017 1528   AST 95 (H) 07/16/2019 1228   ALT 164 (H) 07/16/2019 1228   ALKPHOS 40 01/17/2018   BILITOT 0.5 07/16/2019 1228   BILITOT 0.3 09/06/2017 1528   GFRNONAA 83 07/16/2019 1228   GFRAA 96 07/16/2019 1228       Component Value Date/Time   WBC 4.8 07/16/2019 1228   RBC 4.33 07/16/2019 1228   HGB 13.9 07/16/2019 1228   HGB 13.9 09/06/2017 1528   HCT 41.1 07/16/2019 1228   HCT 40.0 09/06/2017 1528   PLT 198 07/16/2019 1228   PLT 203 09/06/2017 1528   MCV 94.9 07/16/2019 1228   MCV 93 09/06/2017 1528   MCH 32.1 07/16/2019 1228   MCHC 33.8 07/16/2019 1228   RDW 12.1 07/16/2019 1228   RDW 13.1 09/06/2017 1528   LYMPHSABS 1,258 07/16/2019 1228   LYMPHSABS 1.2 09/06/2017 1528   MONOABS 0.3 08/17/2016 0740   EOSABS 120 07/16/2019 1228   EOSABS 0.2 09/06/2017 1528   BASOSABS 29 07/16/2019 1228   BASOSABS 0.0 09/06/2017 1528    No results found for: POCLITH, LITHIUM   No results found for: PHENYTOIN, PHENOBARB, VALPROATE, CBMZ   .res Assessment: Plan:    There are no diagnoses linked to this encounter.   Alternative Viibryd with less sexual.\];'  Prefers instead trial of Wellbutrin for low libido  150 to 300 mg daily.  Continue fluoxetine for anxiety and mood and Xanax for sleep.  Please see After Visit Summary for patient specific instructions.  No future appointments.  No orders of the defined types were placed in this encounter.   -------------------------------

## 2019-07-31 ENCOUNTER — Other Ambulatory Visit: Payer: Self-pay | Admitting: Family Medicine

## 2019-07-31 ENCOUNTER — Other Ambulatory Visit: Payer: Self-pay | Admitting: Psychiatry

## 2019-07-31 DIAGNOSIS — J069 Acute upper respiratory infection, unspecified: Secondary | ICD-10-CM

## 2019-07-31 DIAGNOSIS — J4520 Mild intermittent asthma, uncomplicated: Secondary | ICD-10-CM

## 2019-07-31 DIAGNOSIS — R748 Abnormal levels of other serum enzymes: Secondary | ICD-10-CM

## 2019-07-31 MED ORDER — BUDESONIDE-FORMOTEROL FUMARATE 160-4.5 MCG/ACT IN AERO: 2.0000 | INHALATION_SPRAY | Freq: Two times a day (BID) | RESPIRATORY_TRACT | 3 refills | Status: DC

## 2019-08-01 MED FILL — FLUoxetine HCL 20 MG CAPS: 20 | 90 days supply | Qty: 90 | Fill #0

## 2019-08-02 ENCOUNTER — Other Ambulatory Visit: Payer: Self-pay | Admitting: Family Medicine

## 2019-08-02 MED ORDER — ADVAIR HFA 115-21 MCG/ACT IN AERO: 2.0000 | INHALATION_SPRAY | Freq: Two times a day (BID) | RESPIRATORY_TRACT | 3 refills | Status: DC

## 2019-08-03 MED FILL — ADVAIR HFA 115-21 MCG INH: 115-21 | 30 days supply | Qty: 12 | Fill #0

## 2019-08-08 ENCOUNTER — Other Ambulatory Visit (INDEPENDENT_AMBULATORY_CARE_PROVIDER_SITE_OTHER): Payer: 59

## 2019-08-08 ENCOUNTER — Encounter: Payer: Self-pay | Admitting: Internal Medicine

## 2019-08-08 ENCOUNTER — Other Ambulatory Visit: Payer: Self-pay

## 2019-08-08 ENCOUNTER — Ambulatory Visit: Payer: 59 | Admitting: Internal Medicine

## 2019-08-08 VITALS — BP 110/60 | HR 68 | Temp 98.1°F | Wt 223.2 lb

## 2019-08-08 DIAGNOSIS — R748 Abnormal levels of other serum enzymes: Secondary | ICD-10-CM

## 2019-08-08 LAB — HEPATIC FUNCTION PANEL
ALT: 38 U/L (ref 0–53)
AST: 27 U/L (ref 0–37)
Albumin: 4.5 g/dL (ref 3.5–5.2)
Alkaline Phosphatase: 70 U/L (ref 39–117)
Bilirubin, Direct: 0.1 mg/dL (ref 0.0–0.3)
Total Bilirubin: 0.5 mg/dL (ref 0.2–1.2)
Total Protein: 7.1 g/dL (ref 6.0–8.3)

## 2019-08-08 LAB — CK: Total CK: 82 U/L (ref 7–232)

## 2019-08-08 NOTE — Patient Instructions (Signed)
Your provider has requested that you go to the basement level for lab work before leaving today. Press "B" on the elevator. The lab is located at the first door on the left as you exit the elevator.    Dr Carlean Purl will contact you with results and plans Sir.    I appreciate the opportunity to care for you. Silvano Rusk, MD, [facility]

## 2019-08-08 NOTE — Assessment & Plan Note (Signed)
Cause not clear.  Previous history of fatty liver.  He does have a previous history of transaminases going up and down.  With negative work-ups in the past we do not have them completely documented but he is an obviously reliable historian regarding this.  He is concerned about the possibility of a chronic hepatitis infection.  He did work out significantly prior to these labs and the possibility of muscle release is in my differential.  Serologic evaluation and repeat LFTs as below.  Orders Placed This Encounter  Procedures  . Hepatitis C Antibody  . Hepatitis A Ab, Total  . Hepatitis B Surface AntiGEN  . Hepatitis B Surface AntiBODY  . Antinuclear Antib (ANA)  . Mitochondrial antibodies  . Anti-smooth muscle antibody, IgG  . Hepatic function panel  . Ferritin  . CK (Creatine Kinase)

## 2019-08-08 NOTE — Progress Notes (Signed)
Daniel Lam 58 y.o. 06-Jul-1961 [id number]  Assessment & Plan:   Abnormal transaminases Cause not clear.  Previous history of fatty liver.  He does have a previous history of transaminases going up and down.  With negative work-ups in the past we do not have them completely documented but he is an obviously reliable historian regarding this.  He is concerned about the possibility of a chronic hepatitis infection.  He did work out significantly prior to these labs and the possibility of muscle release is in my differential.  Serologic evaluation and repeat LFTs as below.  Orders Placed This Encounter  Procedures   Hepatitis C Antibody   Hepatitis A Ab, Total   Hepatitis B Surface AntiGEN   Hepatitis B Surface AntiBODY   Antinuclear Antib (ANA)   Mitochondrial antibodies   Anti-smooth muscle antibody, IgG   Hepatic function panel   Ferritin   CK (Creatine Kinase)     I appreciate the opportunity to care for this patient. 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  On October 5 AST 95 ALT 164.  Normal total bilirubin.  Total protein 6.7 and albumin to globulin ratio 1.6.  Alkaline phosphatase was 84.  Globulin fraction 2.5.  Asymptomatic.  The couple of days prior to that he did go on a long hike which he does a couple of times a month.  12 to 14 miles.  Does not remember being particular sore but was vigorously exercising.  Alcohol intake is perhaps 3 days out of the week 2-3 servings.  This is not changed though less than it was in the early days of Covid.  No new drugs except using pantoprazole regularly lately due to some increased reflux but he uses that on a as needed basis.  In the past negative hepatitis C antibody negative acute hepatitis a and B evaluation.  Per Dr. Blanch Media  previous note, the patient had a serologic work-up in the early 2000's that was negative and this is what the patient reports having an extensive evaluation though we do not have those exact labs.  What I do see is a 2003negative iron saturation and a negative hepatitis C antibody so I am not sure if he had those labs before.   His mother had Sjogren's disease he does not have any sicca syndrome symptoms. Allergies  Allergen Reactions   Demerol [Meperidine Hcl]     Difficulty voiding   Food     Mammals- Alpha Gal Syndrome   Latex     REACTION: SOB, rash, itching   Morphine And Related     Difficulty voiding   Current Meds  Medication Sig   acetaminophen (TYLENOL) 500 MG tablet Take 500 mg by mouth every 6 (six) hours as needed.   albuterol (VENTOLIN HFA) 108 (90 Base) MCG/ACT inhaler INHALE 2 PUFFS BY MOUTH INTO THE LUNGS 4 TIMES DAILY. (Patient taking differently: INHALE 2 PUFFS BY MOUTH INTO THE LUNGS 4 TIMES DAILY as needed)   ALPRAZolam (XANAX) 0.5 MG tablet Take 1 tablet (0.5 mg total) by mouth 3 (three) times daily as needed. (Patient taking differently: Take 0.5 mg by mouth 3 (three) times daily as needed. At night)   cholecalciferol (VITAMIN D3) 25 MCG (1000 UT) tablet Take 1,000 Units by mouth daily.   cromolyn (OPTICROM) 4 % ophthalmic  solution Place 2 drops into both eyes 4 (four) times daily. (Patient taking differently: Place 2 drops into both eyes 4 (four) times daily as needed. )   FLUoxetine (PROZAC) 20 MG capsule TAKE 1 CAPSULE (20 MG TOTAL) BY MOUTH DAILY.   fluticasone (CUTIVATE) 0.005 % ointment APPLY 1 APPLICATION TOPICALLY 2 TIMES DAILY. (Patient taking differently: 2 (two) times daily as needed. )   fluticasone-salmeterol (ADVAIR HFA) 115-21 MCG/ACT inhaler Inhale 2 puffs into the lungs 2 (two) times daily.   ibuprofen (ADVIL,MOTRIN) 200 MG tablet Take 200 mg by mouth every 6 (six) hours as needed.   ketoconazole (NIZORAL) 2 % cream Apply 1 application  topically 2 (two) times daily. (Patient taking differently: Apply 1 application topically 2 (two) times daily as needed. )   lidocaine (LINDAMANTLE) 3 % CREA cream Apply 1 application topically as needed.   lidocaine (XYLOCAINE) 5 % ointment Apply 1 application topically as needed.   montelukast (SINGULAIR) 10 MG tablet Take 1 tablet (10 mg total) by mouth at bedtime. (Patient taking differently: Take 10 mg by mouth 2 (two) times daily as needed. )   NONFORMULARY OR COMPOUNDED ITEM RESMED Standard Air Tubing for CPAP   NONFORMULARY OR COMPOUNDED ITEM ResMed Swift FX nasal pillows mask (Medium)   pantoprazole (PROTONIX) 40 MG tablet Take 1 tablet (40 mg total) by mouth daily. (Patient taking differently: Take 40 mg by mouth every morning. )   prednisoLONE acetate (PRED FORTE) 1 % ophthalmic suspension Place 2 drops into both eyes 4 (four) times daily. (Patient taking differently: Place 2 drops into both eyes 4 (four) times daily as needed. )   predniSONE (DELTASONE) 10 MG tablet TAKE 4 TABLETS PO QD FOR 3 DAYS THEN TAKE 3 TABLETS PO QD FOR 3 DAYS THEN TAKE 2 TABLET PO QD FOR 3 DAYS THEN TAKE 11 TAB PO QD FOR 3 DAYS then 1/2 po qd for 3 days (Patient taking differently: TAKE 4 TABLETS PO QD FOR 3 DAYS THEN TAKE 3 TABLETS PO QD FOR 3 DAYS THEN TAKE 2 TABLET PO QD FOR 3 DAYS THEN TAKE 11 TAB PO QD FOR 3 DAYS then 1/2 po qd for 3 days prn)   pseudoephedrine (SUDAFED) 60 MG tablet Take 1 tablet (60 mg total) by mouth every 4 (four) hours as needed for congestion.   valACYclovir (VALTREX) 1000 MG tablet Take 1 tablet (1,000 mg total) by mouth daily.   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 08/2009   Social History   Social History Narrative   Regular exercise: yes, very active  Caffeine use: 2 cups of coffee and soda's daily.   Right-handed.   family history includes COPD in his mother; Colon polyps in his father; Dementia in his father; Melanoma in his brother; Sjogren's syndrome in his mother; Throat cancer in his father.   Review of Systems As per HPI  Objective:   Physical Exam BP 110/60    Pulse 68    Temp 98.1 F (36.7 C)    Wt 223 lb 3.2 oz (101.2 kg)    BMI 32.03 kg/m  Well-developed well-nourished no acute distress Eyes anicteric Lungs clear Normal heart sounds Abdomen is soft and nontender without organomegaly or mass liver is percussed to about 8 cm No stigmata of chronic liver disease seen on the skin

## 2019-08-09 LAB — FERRITIN: Ferritin: 56.3 ng/mL (ref 22.0–322.0)

## 2019-08-09 NOTE — Progress Notes (Signed)
Nieko,  Here is what we have so far.  The analyzer needed a part so that is why LFT's not back.  Glendell Docker

## 2019-08-10 NOTE — Progress Notes (Signed)
[place],  I wonder if the hiking did it?  LFT's back to normal  Glendell Docker

## 2019-08-12 LAB — HEPATITIS C ANTIBODY
Hepatitis C Ab: NONREACTIVE
SIGNAL TO CUT-OFF: 0.03 (ref <1.00)

## 2019-08-12 LAB — HEPATITIS A ANTIBODY, TOTAL: Hepatitis A AB,Total: REACTIVE — AB

## 2019-08-12 LAB — HEPATITIS B SURFACE ANTIBODY,QUALITATIVE: Hep B S Ab: REACTIVE — AB

## 2019-08-12 LAB — ANTI-SMOOTH MUSCLE ANTIBODY, IGG: Actin (Smooth Muscle) Antibody (IGG): <20 U (ref <20)

## 2019-08-12 LAB — ANA: Anti Nuclear Antibody (ANA): POSITIVE — AB

## 2019-08-12 LAB — ANTI-NUCLEAR AB-TITER (ANA TITER): ANA Titer 1: 1:40 {titer} — ABNORMAL HIGH

## 2019-08-12 LAB — MITOCHONDRIAL ANTIBODIES: Mitochondrial M2 Ab, IgG: <=20 U

## 2019-08-12 LAB — HEPATITIS B SURFACE ANTIGEN: Hepatitis B Surface Ag: NONREACTIVE

## 2019-08-20 ENCOUNTER — Other Ambulatory Visit: Payer: Self-pay | Admitting: Family Medicine

## 2019-08-20 DIAGNOSIS — J302 Other seasonal allergic rhinitis: Secondary | ICD-10-CM

## 2019-08-20 DIAGNOSIS — J069 Acute upper respiratory infection, unspecified: Secondary | ICD-10-CM

## 2019-08-20 DIAGNOSIS — M5440 Lumbago with sciatica, unspecified side: Secondary | ICD-10-CM

## 2019-08-20 MED ORDER — PSEUDOEPHEDRINE HCL 60 MG PO TABS: 60.0000 mg | ORAL_TABLET | ORAL | 0 refills | Status: DC | PRN

## 2019-08-20 MED ORDER — PREDNISONE 10 MG PO TABS: ORAL_TABLET | ORAL | 0 refills | Status: DC

## 2019-08-20 MED FILL — SUDOGEST 60 MG TABLET: 60 | 5 days supply | Qty: 30 | Fill #0

## 2019-08-20 MED FILL — predniSONE 10 MG TABS: 10 | 12 days supply | Qty: 20 | Fill #0

## 2019-08-23 ENCOUNTER — Other Ambulatory Visit: Payer: Self-pay

## 2019-08-23 ENCOUNTER — Ambulatory Visit (HOSPITAL_BASED_OUTPATIENT_CLINIC_OR_DEPARTMENT_OTHER)
Admission: RE | Admit: 2019-08-23 | Discharge: 2019-08-23 | Disposition: A | Discharge status: Home / Self Care | Payer: 59 | Source: Ambulatory Visit | Attending: Family Medicine | Admitting: Family Medicine

## 2019-08-23 ENCOUNTER — Other Ambulatory Visit: Payer: Self-pay | Admitting: Family Medicine

## 2019-08-23 DIAGNOSIS — M545 Low back pain, unspecified: Secondary | ICD-10-CM

## 2019-08-23 DIAGNOSIS — M5136 Other intervertebral disc degeneration, lumbar region: Secondary | ICD-10-CM

## 2019-08-27 ENCOUNTER — Ambulatory Visit (HOSPITAL_COMMUNITY)
Admission: RE | Admit: 2019-08-27 | Discharge: 2019-08-27 | Disposition: A | Discharge status: Home / Self Care | Payer: 59 | Source: Ambulatory Visit | Attending: Family Medicine | Admitting: Family Medicine

## 2019-08-27 ENCOUNTER — Other Ambulatory Visit: Payer: Self-pay

## 2019-08-27 DIAGNOSIS — M545 Low back pain: Secondary | ICD-10-CM | POA: Diagnosis not present

## 2019-08-27 DIAGNOSIS — M5136 Other intervertebral disc degeneration, lumbar region: Secondary | ICD-10-CM | POA: Insufficient documentation

## 2019-08-28 ENCOUNTER — Other Ambulatory Visit: Payer: Self-pay | Admitting: Family Medicine

## 2019-08-28 DIAGNOSIS — M48061 Spinal stenosis, lumbar region without neurogenic claudication: Secondary | ICD-10-CM

## 2019-08-28 DIAGNOSIS — M5126 Other intervertebral disc displacement, lumbar region: Secondary | ICD-10-CM

## 2019-08-29 DIAGNOSIS — M5417 Radiculopathy, lumbosacral region: Secondary | ICD-10-CM | POA: Diagnosis not present

## 2019-08-29 DIAGNOSIS — M5127 Other intervertebral disc displacement, lumbosacral region: Secondary | ICD-10-CM | POA: Diagnosis not present

## 2019-08-29 MED FILL — METHYLPREDNISOLONE 4 MG TBP: 4 | 6 days supply | Qty: 21 | Fill #0

## 2019-09-10 DIAGNOSIS — M545 Low back pain: Secondary | ICD-10-CM | POA: Diagnosis not present

## 2019-09-13 ENCOUNTER — Other Ambulatory Visit: Payer: Self-pay | Admitting: Family Medicine

## 2019-09-13 DIAGNOSIS — M5126 Other intervertebral disc displacement, lumbar region: Secondary | ICD-10-CM

## 2019-09-13 DIAGNOSIS — G4733 Obstructive sleep apnea (adult) (pediatric): Secondary | ICD-10-CM | POA: Diagnosis not present

## 2019-09-17 DIAGNOSIS — M545 Low back pain: Secondary | ICD-10-CM | POA: Diagnosis not present

## 2019-09-17 MED FILL — PANTOPRAZOLE SOD DR 40 MG T: 40 | 90 days supply | Qty: 90 | Fill #2

## 2019-09-21 DIAGNOSIS — M545 Low back pain: Secondary | ICD-10-CM | POA: Diagnosis not present

## 2019-09-25 DIAGNOSIS — M5416 Radiculopathy, lumbar region: Secondary | ICD-10-CM | POA: Diagnosis not present

## 2019-09-26 DIAGNOSIS — M545 Low back pain: Secondary | ICD-10-CM | POA: Diagnosis not present

## 2019-10-02 DIAGNOSIS — M5416 Radiculopathy, lumbar region: Secondary | ICD-10-CM | POA: Diagnosis not present

## 2019-10-09 DIAGNOSIS — M545 Low back pain: Secondary | ICD-10-CM | POA: Diagnosis not present

## 2019-10-10 ENCOUNTER — Telehealth: Payer: Self-pay

## 2019-10-10 ENCOUNTER — Other Ambulatory Visit: Payer: Self-pay | Admitting: Family Medicine

## 2019-10-10 DIAGNOSIS — B0223 Postherpetic polyneuropathy: Secondary | ICD-10-CM

## 2019-10-10 MED FILL — valACYclovir HCL 1 GM TABS: 1 | 90 days supply | Qty: 90 | Fill #0

## 2019-10-10 NOTE — Telephone Encounter (Signed)
error 

## 2019-10-16 DIAGNOSIS — M545 Low back pain: Secondary | ICD-10-CM | POA: Diagnosis not present

## 2019-11-07 MED FILL — FLUoxetine HCL 20 MG CAPS: 20 | 90 days supply | Qty: 90 | Fill #1

## 2019-11-15 DIAGNOSIS — M5416 Radiculopathy, lumbar region: Secondary | ICD-10-CM | POA: Diagnosis not present

## 2019-11-19 ENCOUNTER — Other Ambulatory Visit: Payer: Self-pay

## 2019-11-19 ENCOUNTER — Ambulatory Visit (HOSPITAL_BASED_OUTPATIENT_CLINIC_OR_DEPARTMENT_OTHER)
Admission: RE | Admit: 2019-11-19 | Discharge: 2019-11-19 | Disposition: A | Discharge status: Home / Self Care | Payer: 59 | Source: Ambulatory Visit | Attending: Family Medicine | Admitting: Family Medicine

## 2019-11-19 DIAGNOSIS — R0602 Shortness of breath: Secondary | ICD-10-CM

## 2019-11-22 DIAGNOSIS — H5213 Myopia, bilateral: Secondary | ICD-10-CM | POA: Diagnosis not present

## 2019-12-07 ENCOUNTER — Telehealth: Payer: Self-pay | Admitting: Family

## 2019-12-07 MED ORDER — FLUTICASONE-SALMETEROL 100-50 MCG/DOSE IN AEPB: 1.0000 | INHALATION_SPRAY | Freq: Two times a day (BID) | RESPIRATORY_TRACT | 5 refills | Status: DC

## 2019-12-07 MED FILL — ADVAIR 100/50 DISKUS: 100-50 | 30 days supply | Qty: 60 | Fill #0

## 2019-12-07 NOTE — Telephone Encounter (Signed)
Pt requesting rx for Advair instead of symbicort due to cost. Rx sent.

## 2019-12-24 ENCOUNTER — Other Ambulatory Visit: Payer: Self-pay | Admitting: Family Medicine

## 2019-12-24 ENCOUNTER — Other Ambulatory Visit: Payer: Self-pay | Admitting: Psychiatry

## 2019-12-24 MED FILL — PANTOPRAZOLE SOD DR 40 MG T: 40 | 90 days supply | Qty: 90 | Fill #3

## 2019-12-25 MED FILL — ALBUTEROL SULFATE HFA 108 (: 108 (90 BAS | 25 days supply | Qty: 18 | Fill #0

## 2019-12-25 NOTE — Telephone Encounter (Signed)
Last fill 08/31, scheduled back 07/2020

## 2019-12-26 MED FILL — ALPRAZolam 0.5 MG TABS: 0.5 | 90 days supply | Qty: 270 | Fill #0

## 2020-01-16 ENCOUNTER — Other Ambulatory Visit: Payer: Self-pay

## 2020-01-16 MED ORDER — FLUTICASONE-SALMETEROL 100-50 MCG/DOSE IN AEPB: 1.0000 | INHALATION_SPRAY | Freq: Two times a day (BID) | RESPIRATORY_TRACT | 5 refills | Status: DC

## 2020-01-16 MED FILL — ADVAIR 100/50 DISKUS: 100-50 | 30 days supply | Qty: 60 | Fill #0

## 2020-02-05 MED FILL — FLUoxetine HCL 20 MG CAPS: 20 | 90 days supply | Qty: 90 | Fill #2

## 2020-03-16 IMAGING — MR MR LUMBAR SPINE W/O CM
4 of 5 series · 18 of 48 positions shown · non-contrast
Comparison: 1797

CLINICAL DATA: Low back pain radiating down right leg with numbness
and weakness

EXAM:
MRI LUMBAR SPINE WITHOUT CONTRAST
TECHNIQUE: Multiplanar, multisequence MR imaging of the lumbar spine was
performed. No intravenous contrast was administered.

[Series 4: T1 · sagittal · 4.0mm · 0.53mm/px · 3 of 13 slices shown (1 of 2)]
[im 3/13]
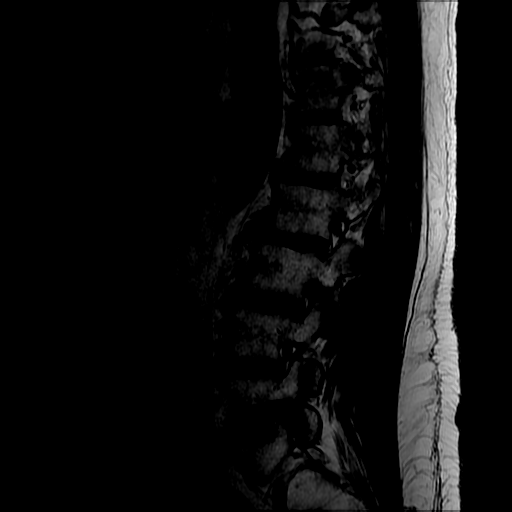
[im 8/13]
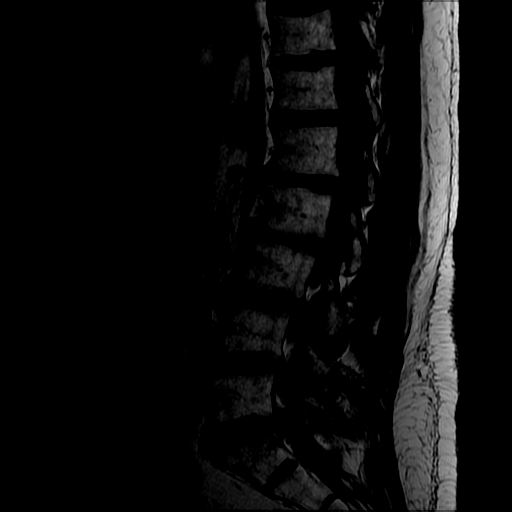
[im 13/13]
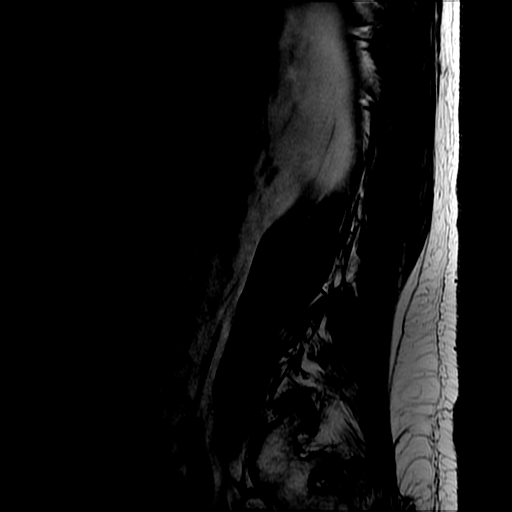

[Series 5: T2 post-contrast · sagittal · 4.0mm · 0.53mm/px · 5 of 13 slices shown]
[im 1/13]
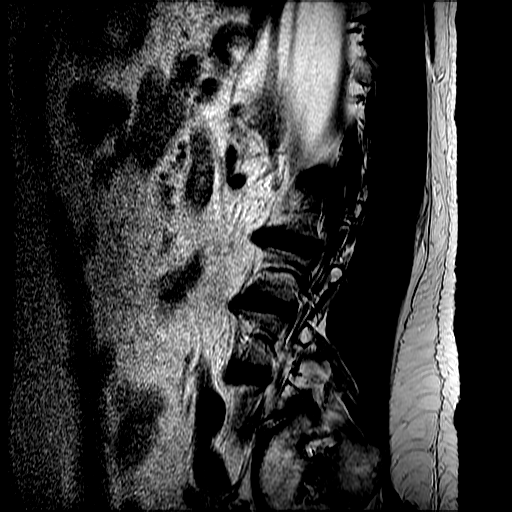
[im 4/13]
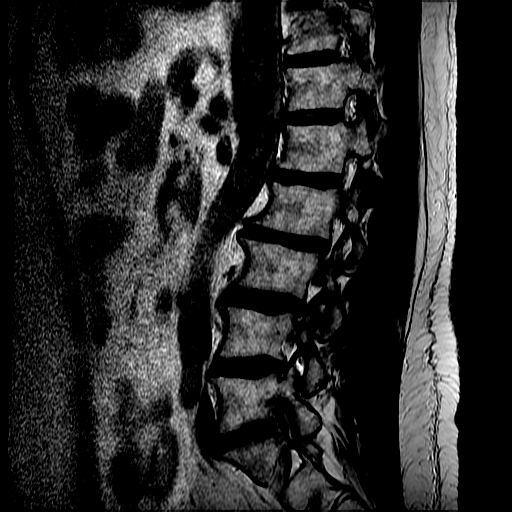
[im 7/13]
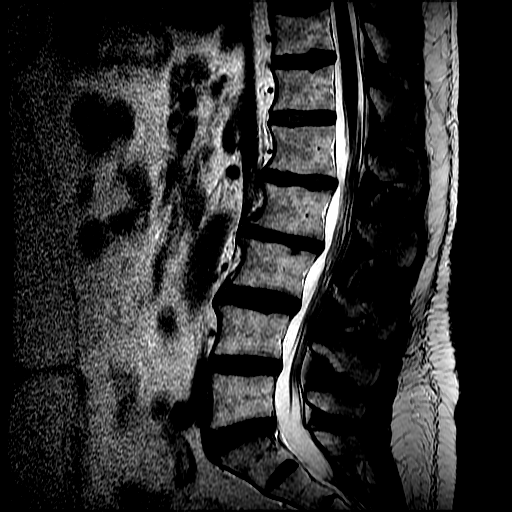
[im 10/13]
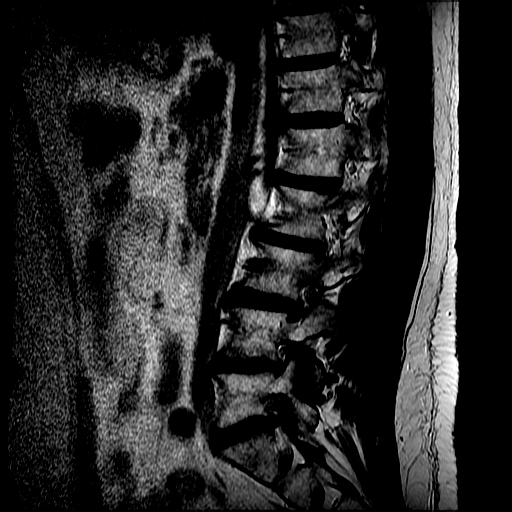
[im 13/13]
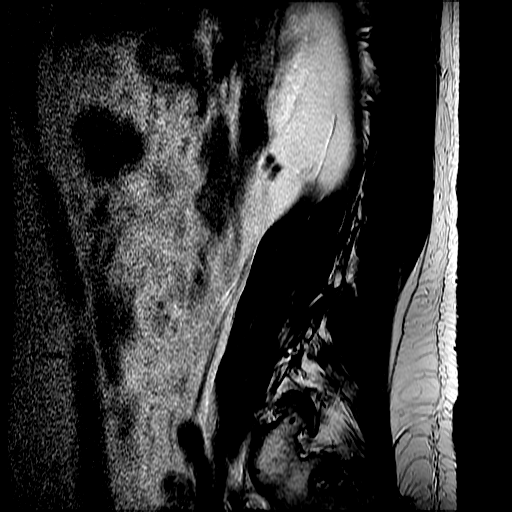

[Series 7: T2 · axial · 4.0mm · 0.39mm/px · z∈[-160,-6]mm · 7 of 38 slices shown]
[im 3/38]
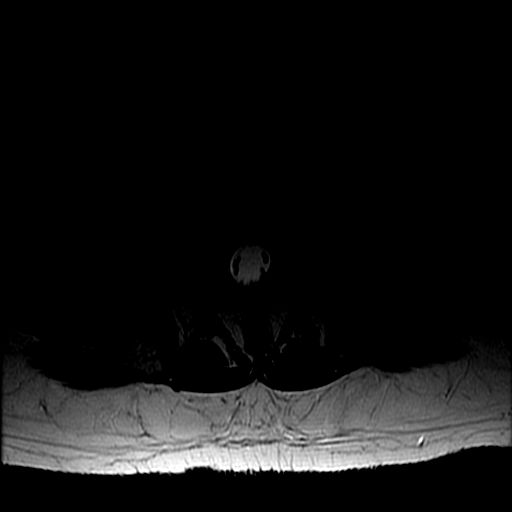
[im 5/38]
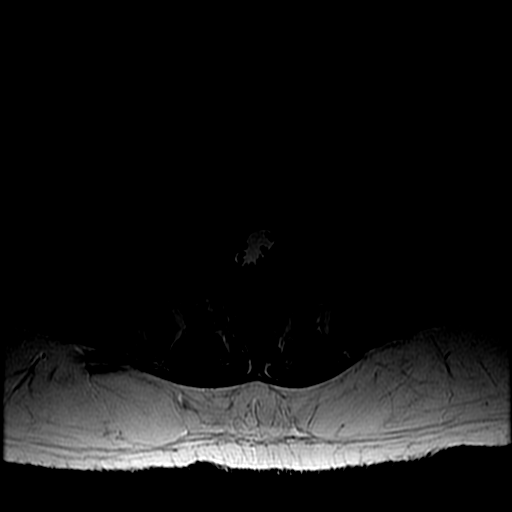
[im 8/38]
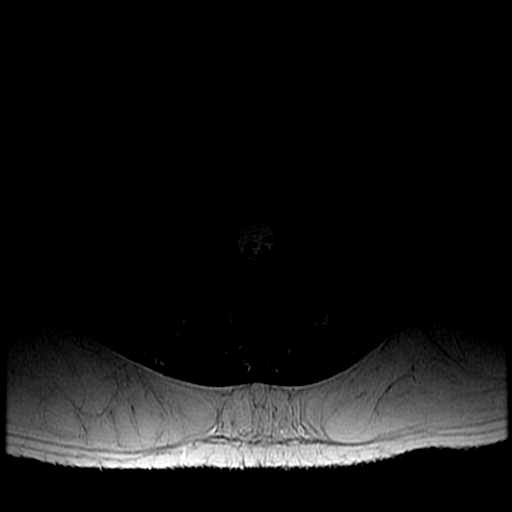
[im 13/38]
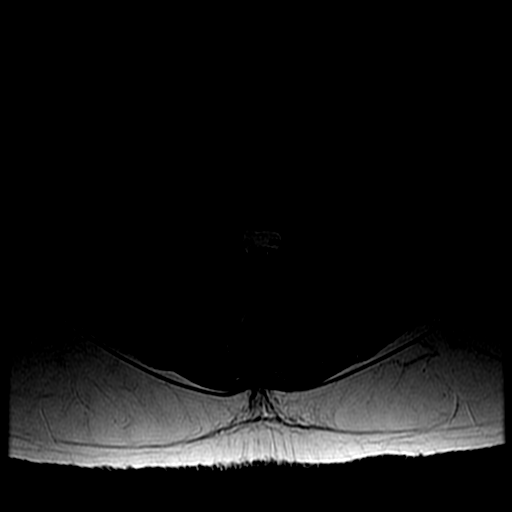
[im 18/38]
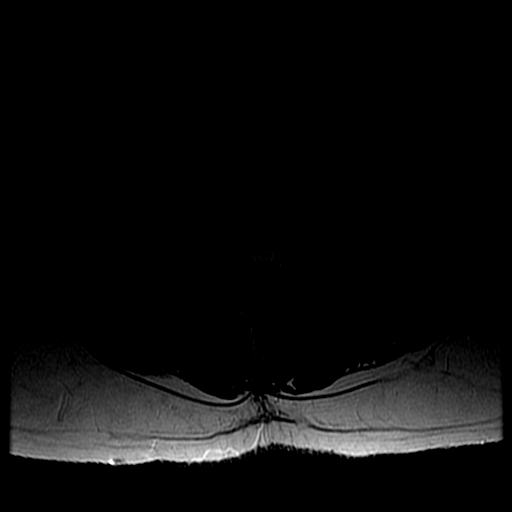
[im 20/38]
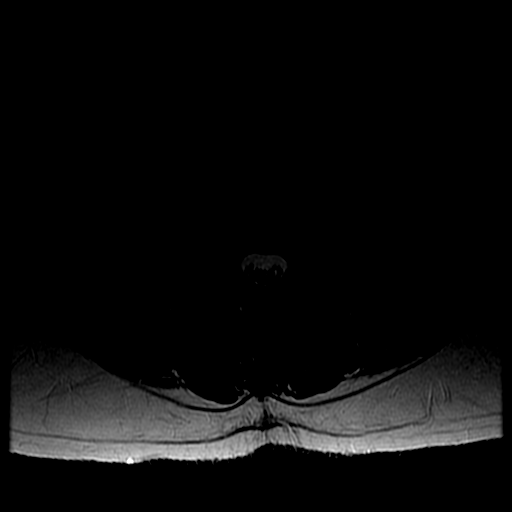
[im 33/38]
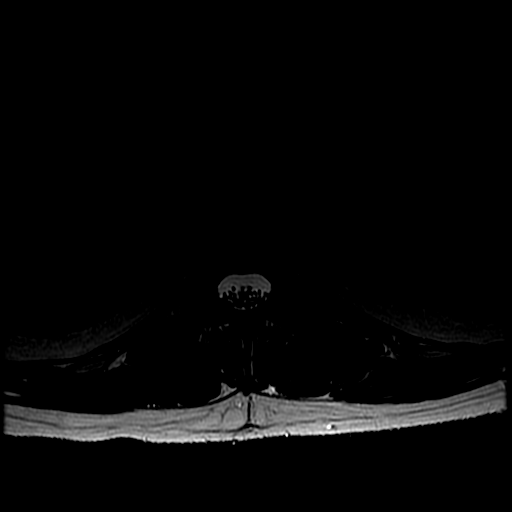

[Series 8: T1 · axial · 4.0mm · 0.39mm/px · z∈[-150,-6]mm · 3 of 38 slices shown (2 of 2)]
[im 5/38]
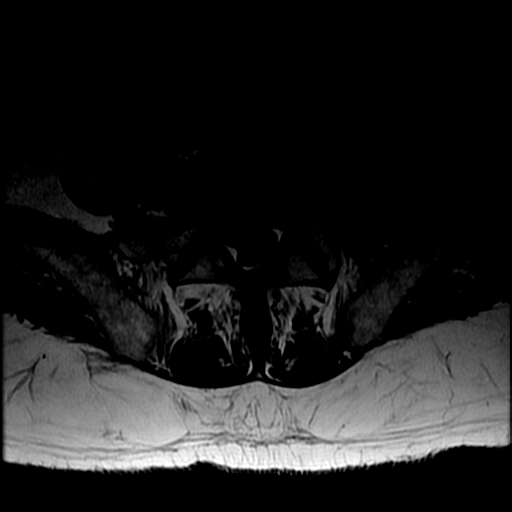
[im 20/38]
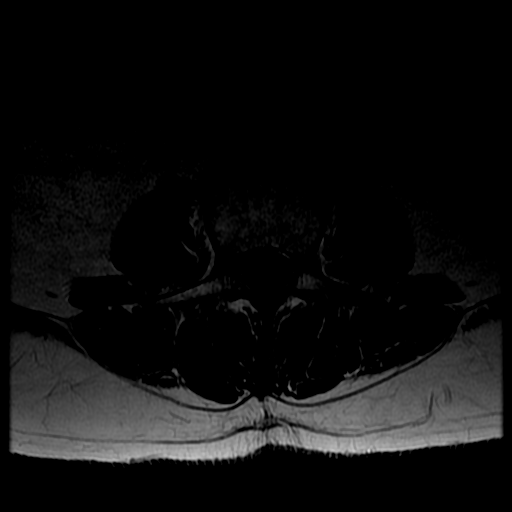
[im 33/38]
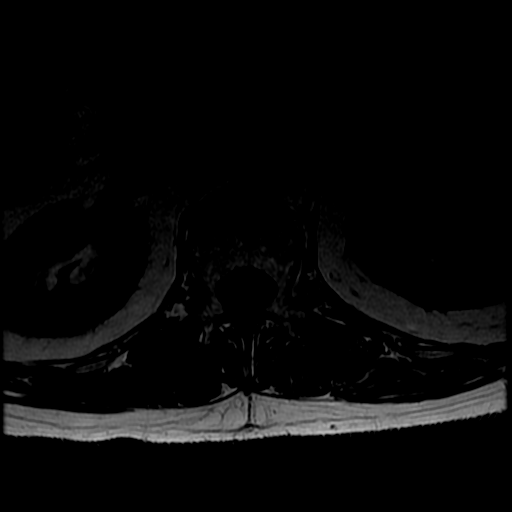

[18 of 48 positions shown; findings below may reference images not displayed]

FINDINGS: Segmentation:  Standard.

Alignment: Stable including trace retrolisthesis from L2-L3 to
L5-S1.

Vertebrae: Stable vertebral body heights with mild degenerative
endplate irregularity and small Schmorl's nodes. There is no
significant marrow edema. No suspicious osseous lesion.

Conus medullaris and cauda equina: Conus extends to the L1 level.
Conus and cauda equina appear normal.

Paraspinal and other soft tissues: Unremarkable.

Disc levels:

L1-L2: Mild disc bulge eccentric to the right and mild facet
arthropathy with ligamentum flavum infolding. No significant canal
or foraminal stenosis. Appearance is similar.

L2-L3:  No significant canal or foraminal stenosis.

L3-L4: Disc bulge and mild facet arthropathy with ligamentum flavum
infolding. Mild canal stenosis with slight effacement of the lateral
recesses. Moderate right greater than left foraminal stenosis.
Appearance is similar.

L4-L5: Disc bulge and mild facet arthropathy with ligamentum flavum
infolding. Slightly increased minor canal stenosis. Moderate
foraminal stenosis, right greater than left, is similar.

L5-S1: Disc bulge with endplate osteophytic ridging and new
superimposed right subarticular disc extrusion extending slightly
below the disc level. Mild facet arthropathy. No significant canal
stenosis. There is effacement of the right lateral recess with
compression of the traversing S1 nerve root. Moderate to marked
right and marked left foraminal stenosis is similar.
IMPRESSION: Multilevel degenerative changes as detailed above. Most notably, a
new disc herniation at L5-S1 compresses the right S1 nerve root.
Otherwise similar appearance to 1797.

## 2020-03-21 ENCOUNTER — Other Ambulatory Visit: Payer: Self-pay | Admitting: Family Medicine

## 2020-03-21 DIAGNOSIS — M5416 Radiculopathy, lumbar region: Secondary | ICD-10-CM

## 2020-03-24 MED FILL — ALPRAZolam 0.5 MG TABS: 0.5 | 90 days supply | Qty: 270 | Fill #1

## 2020-03-25 ENCOUNTER — Other Ambulatory Visit: Payer: Self-pay

## 2020-03-25 DIAGNOSIS — R1013 Epigastric pain: Secondary | ICD-10-CM

## 2020-03-25 MED ORDER — PANTOPRAZOLE SODIUM 40 MG PO TBEC: 40.0000 mg | DELAYED_RELEASE_TABLET | Freq: Every day | ORAL | 3 refills | Status: DC

## 2020-03-25 MED FILL — PANTOPRAZOLE SOD DR 40 MG T: 40 | 90 days supply | Qty: 90 | Fill #0

## 2020-03-31 ENCOUNTER — Other Ambulatory Visit: Payer: Self-pay | Admitting: Family Medicine

## 2020-03-31 DIAGNOSIS — M5126 Other intervertebral disc displacement, lumbar region: Secondary | ICD-10-CM

## 2020-04-09 MED FILL — valACYclovir HCL 1 GM TABS: 1 | 90 days supply | Qty: 90 | Fill #1

## 2020-04-10 ENCOUNTER — Other Ambulatory Visit: Payer: Self-pay | Admitting: Family Medicine

## 2020-04-10 MED FILL — MONTELUKAST SOD 10 MG TAB: 10 | 90 days supply | Qty: 90 | Fill #0

## 2020-05-01 DIAGNOSIS — D225 Melanocytic nevi of trunk: Secondary | ICD-10-CM | POA: Diagnosis not present

## 2020-05-01 DIAGNOSIS — D1801 Hemangioma of skin and subcutaneous tissue: Secondary | ICD-10-CM | POA: Diagnosis not present

## 2020-05-01 DIAGNOSIS — L570 Actinic keratosis: Secondary | ICD-10-CM | POA: Diagnosis not present

## 2020-05-01 DIAGNOSIS — Z872 Personal history of diseases of the skin and subcutaneous tissue: Secondary | ICD-10-CM | POA: Diagnosis not present

## 2020-05-01 DIAGNOSIS — L905 Scar conditions and fibrosis of skin: Secondary | ICD-10-CM | POA: Diagnosis not present

## 2020-05-01 DIAGNOSIS — D485 Neoplasm of uncertain behavior of skin: Secondary | ICD-10-CM | POA: Diagnosis not present

## 2020-05-07 MED FILL — FLUoxetine HCL 20 MG CAPS: 20 | 90 days supply | Qty: 90 | Fill #3

## 2020-05-15 ENCOUNTER — Ambulatory Visit: Payer: 59 | Attending: Family Medicine | Admitting: Physical Therapy

## 2020-05-15 ENCOUNTER — Other Ambulatory Visit: Payer: Self-pay

## 2020-05-15 DIAGNOSIS — M6281 Muscle weakness (generalized): Secondary | ICD-10-CM | POA: Insufficient documentation

## 2020-05-15 DIAGNOSIS — M5441 Lumbago with sciatica, right side: Secondary | ICD-10-CM | POA: Diagnosis not present

## 2020-05-15 DIAGNOSIS — G8929 Other chronic pain: Secondary | ICD-10-CM | POA: Diagnosis not present

## 2020-05-15 NOTE — Therapy (Signed)
[facility] [address], [place], [place], [address] Phone: [phone]   Fax:  [phone]  Physical Therapy Evaluation  Patient Details  Name: Daniel Lam MRN: [id number] Date of Birth: 1961-04-20 Referring Provider (PT): Lyndal Pulley DO   Encounter Date: 05/15/2020   PT End of Session - 05/15/20 2142    Visit Number 1    Date for PT Re-Evaluation 07/10/20    Authorization Type UMR 25 visits    PT Start Time 3299    PT Stop Time 1620    PT Time Calculation (min) 50 min    Activity Tolerance Patient tolerated treatment well           Past Medical History:  Diagnosis Date  . Anxiety state, unspecified   . Atypical mole 2008  . Complication of anesthesia    had some difficulty voiding  . Dermatitis    anal dermatitis  . Hemorrhoids   . Herpes simplex without mention of complication   . 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 Numbness and tingling   . Other and unspecified anterior pituitary hyperfunction   . Sleep apnea   . Unspecified asthma(493.90)    controlled    Past Surgical History:  Procedure Laterality Date  . APPENDECTOMY    . KNEE ARTHROPLASTY Left   . KNEE ARTHROSCOPY  09/01/2011   Procedure: ARTHROSCOPY KNEE;  Surgeon: Alta Corning;  Location: [place];  Service: Orthopedics;  Laterality: Left;  medial and lateral meniscus tear debridment  and chondroplasty  . LASIK    . ORCHIECTOMY     Lt. 08/2009    There were no vitals filed for this visit.    Subjective Assessment - 05/15/20 1533    Subjective Few years ago hurt back while throwing something.  Did PT and doing great since then.  In October was cleaning car and had sudden pain right low back and right LE numbness.  Had PT privately did extensions. Had 2 ESI which helped.  Numbness almost gone.  Previously hiked and biked.  Started mountain biking back in February, 10  min stop and needs to stretch.  Plays pickleball.    Pertinent History knee mensicus surgery    How long can you sit comfortably? 1 hours    How long can you walk comfortably? as long as I want to for hours    Diagnostic tests MRI    Patient Stated Goals Hoping to get back stronger so can do pickleball, bike, hike    Currently in Pain? Yes    Pain Score 1     Pain Location Back    Pain Orientation Right;Left    Pain Radiating Towards numbness in right LE with exercise mostly biking front thigh, leg and toes    Aggravating Factors  biking; lying supine head on 2 pillows and legs extended    Pain Relieving Factors Tylenol, ibuprofen              [facility] PT Assessment - 05/15/20 0001      Assessment   Medical Diagnosis lumbar herniated disc     Referring Provider (PT) Lyndal Pulley DO    Onset Date/Surgical Date --   this episode October   Next MD Visit as needed    Prior Therapy December      Precautions   Precautions None      Restrictions   Weight  Bearing Restrictions No      Balance Screen   Has the patient fallen in the past 6 months No    Has the patient had a decrease in activity level because of a fear of falling?  No    Is the patient reluctant to leave their home because of a fear of falling?  No      Home Ecologist residence      Prior Function   Vocation Full time employment    Vocation Requirements physician     Leisure hiking, biking,       Observation/Other Assessments   Focus on Therapeutic Outcomes (FOTO)  37% limitation       Posture/Postural Control   Postural Limitations Decreased lumbar lordosis      AROM   Overall AROM Comments Pain with right hip external rotation     Lumbar Flexion 40    Lumbar Extension 20    Lumbar - Right Side Bend 15    Lumbar - Left Side Bend 15      Strength   Overall Strength Comments difficulty stabilizing with bird dogs, unable to keep transverse abdom muscles engaged with bent  knee lowers     Right Hip Extension 4+/5    Right Hip ABduction 4/5    Left Hip Extension 4+/5    Left Hip ABduction 4/5    Lumbar Flexion 4-/5    Lumbar Extension 4-/5      Flexibility   Hamstrings decreased bil HS length to 65    Quadriceps decreased bil hip flexor lengths bil 5 degrees       Slump test   Findings Positive    Side Right    Comment Mild LBP but no radicular symptoms      Prone Knee Bend Test   Findings Negative      Straight Leg Raise   Findings Negative                      Objective measurements completed on examination: See above findings.               PT Education - 05/15/20 1703    Education Details transverse abdominus in 3 positions; bird dogs, sidelying clams; sit to stand    Person(s) Educated Patient    Methods Explanation;Demonstration;Handout    Comprehension Returned demonstration;Verbalized understanding            PT Short Term Goals - 05/15/20 2154      PT SHORT TERM GOAL #1   Title The patient will demonstrate good carryover with intial HEP    Time 4    Period Weeks    Status New    Target Date 06/12/20      PT SHORT TERM GOAL #2   Title The patient will have improved lumbar flexion to 50 degrees, extension to 25 degrees, bil sidebending to 20 degrees for improved mobility with hiking and pickleball    Time 4    Period Weeks    Status New      PT SHORT TERM GOAL #3   Title The patient will demonstrate good technique with hip hinge needed for standing to wash dishes, and bending over    Time 4    Period Weeks    Status New             PT Long Term Goals - 05/15/20 2157      PT LONG TERM  GOAL #1   Title The patient will be independent with safe self progression of HEP    Time 8    Period Weeks    Status New    Target Date 07/10/20      PT LONG TERM GOAL #2   Title The patient will have improved trunk and hip abduction strength to grossly 4+/5 needed for biking, hiking and playing  pickleball    Time 8    Period Weeks    Status New      PT LONG TERM GOAL #3   Title The patient will have improved HS lengths hip flexor lengths and improved lumbar flexion to 60 degrees and extension to 30 degrees needed for mobility with biking and pickleball    Time 8    Period Weeks    Status New      PT LONG TERM GOAL #4   Title The patient will demonstrate good technique with lifting as needed for maneuvering suitcase while traveling    Time 8    Period Weeks    Status New      PT LONG TERM GOAL #5   Title FOTO functional outcome score improved from 37% limitation to 32%    Time 8    Period Mona - 05/15/20 1626    Clinical Impression Statement The patient initially injured his back 4 years, recovered well until an episode in October resulting in right > left LBP with numbness in right thigh and lower leg.  He improved following 2 ESI and returned to mountain biking, hiking and playing pickleball in February but he continues to have intermittent pain and some LE numbness.  Symptoms also produced or aggravated with lying supine with his head propped on pillows and legs straight and mornings.  He is better with knee to chest stretching.  He avoids all lifting including suitcases.  Will be traveling to [place] next month.  Decreased lumbar ROM in all planes: flexion 40, extension 20, bilateral sidebending 15 degrees. Decreased bil HS and hip flexor lengths.    Decreased lumbo/pelvic/hip strength grossly 4/5 including difficulty engaging transverse abdominus muscles.  He would benefit with a core strengthening program to address these deficits.    Personal Factors and Comorbidities Time since onset of injury/illness/exacerbation    Examination-Activity Limitations Other;Lift    Examination-Participation Restrictions Other    Stability/Clinical Decision Making Stable/Uncomplicated    Clinical Decision Making Low    Rehab Potential Good     PT Frequency 1x / week    PT Duration 8 weeks    PT Treatment/Interventions ADLs/Self Care Home Management;Cryotherapy;Electrical Stimulation;Traction;Moist Heat;Neuromuscular re-education;Therapeutic exercise;Therapeutic activities;Patient/family education;Manual techniques;Dry needling;Taping    PT Next Visit Plan review and progress lumbo/pelvic/hip core strengthening; prone multifidi; standing Pallof; Capt Morgans;  hip flexor stretching;  neural and sciatic nerve gliding;   hip hinge/modified dead lift    PT Home Exercise Plan [email or web address]    Consulted and Agree with Plan of Care Patient           Patient will benefit from skilled therapeutic intervention in order to improve the following deficits and impairments:  Decreased range of motion, Pain, Decreased activity tolerance, Decreased strength  Visit Diagnosis: Chronic right-sided low back pain with right-sided sciatica - Plan: PT plan of care cert/re-cert  Muscle weakness (generalized) - Plan: PT plan of care cert/re-cert  Problem List Patient Active Problem List   Diagnosis Date Noted  . Abnormal transaminases 08/08/2019  . Viral respiratory illness 12/24/2018  . Annual physical exam 08/22/2018  . Tear of LCL (lateral collateral ligament) of knee, left, initial encounter 08/18/2017  . Lumbar radiculopathy 01/20/2017  . Paresthesia 08/18/2016  . Low serum testosterone 12/16/2015  . Metatarsalgia of left foot 09/13/2013  . OSA (obstructive sleep apnea) 09/30/2011  . Hypogonadism male 07/06/2011  . Herpes simplex virus (HSV) infection 12/11/2010  . Anxiety state 12/11/2010  . Asthma 12/11/2010  . SEMINOMA 12/11/2009   Ruben Im, PT 05/15/20 10:05 PM Phone: [phone] Fax: [phone] Alvera Singh 05/15/2020, 10:05 PM  [facility] [address], [place], [place], [address] Phone: [phone]   Fax:  [phone]  Name: Daniel Lam MRN:  [id number] Date of Birth: [birth year]

## 2020-05-15 NOTE — Patient Instructions (Signed)
Access Code: [email or web address] URL: [email or web address] Date: 05/15/2020 Prepared by: Ruben Im  Exercises Supine Transversus Abdominis Bracing - Hands on Stomach - 1 x daily - 7 x weekly - 1 sets - 10 reps Hooklying Isometric Hip Flexion with Opposite Arm - 1 x daily - 7 x weekly - 1 sets - 10 reps Seated Transversus Abdominis Bracing - 1 x daily - 7 x weekly - 1 sets - 10 reps Standing Transverse Abdominis Contraction - 1 x daily - 7 x weekly - 1 sets - 10 reps Bird Dog - 1 x daily - 7 x weekly - 1 sets - 10 reps Clamshell - 1 x daily - 7 x weekly - 1 sets - 10 reps Sit to Stand without Arm Support - 1 x daily - 7 x weekly - 1 sets - 10 reps

## 2020-05-22 ENCOUNTER — Other Ambulatory Visit: Payer: Self-pay

## 2020-05-22 ENCOUNTER — Ambulatory Visit: Payer: 59 | Admitting: Physical Therapy

## 2020-05-22 DIAGNOSIS — M6281 Muscle weakness (generalized): Secondary | ICD-10-CM

## 2020-05-22 DIAGNOSIS — G8929 Other chronic pain: Secondary | ICD-10-CM

## 2020-05-22 DIAGNOSIS — M5441 Lumbago with sciatica, right side: Secondary | ICD-10-CM

## 2020-05-22 NOTE — Patient Instructions (Signed)
Access Code: [email or web address] URL: [email or web address] Date: 05/22/2020 Prepared by: Ruben Im  Exercises Supine Transversus Abdominis Bracing - Hands on Stomach - 1 x daily - 7 x weekly - 1 sets - 10 reps Hooklying Isometric Hip Flexion with Opposite Arm - 1 x daily - 7 x weekly - 1 sets - 10 reps Seated Transversus Abdominis Bracing - 1 x daily - 7 x weekly - 1 sets - 10 reps Standing Transverse Abdominis Contraction - 1 x daily - 7 x weekly - 1 sets - 10 reps Bird Dog - 1 x daily - 7 x weekly - 1 sets - 10 reps Clamshell - 1 x daily - 7 x weekly - 1 sets - 10 reps Sit to Stand without Arm Support - 1 x daily - 7 x weekly - 1 sets - 10 reps Seated Slump Nerve Glide - 1 x daily - 7 x weekly - 1 sets - 10 reps Prone Hip Extension - One Pillow - 1 x daily - 7 x weekly - 1 sets - 10 reps Standing Isometric Hip Abduction with Ball on Wall - 1 x daily - 7 x weekly - 1 sets - 7 reps - 7 hold Standing Anti-Rotation Press with Anchored Resistance - 1 x daily - 7 x weekly - 1 sets - 10 reps Standing Hip Hinge with Dowel - 1 x daily - 7 x weekly - 1 sets - 10 reps

## 2020-05-22 NOTE — Therapy (Signed)
[facility] [address] [place], [place], [address] Phone: [phone]   Fax:  [phone]  Physical Therapy Treatment  Patient Details  Name: Daniel Lam MRN: [id number] Date of Birth: 07/09/1961 Referring Provider (PT): Lyndal Pulley DO   Encounter Date: 05/22/2020   PT End of Session - 05/22/20 1623    Visit Number 2    Date for PT Re-Evaluation 07/10/20    Authorization Type UMR 25 visits    PT Start Time 0539    PT Stop Time 1605    PT Time Calculation (min) 48 min    Activity Tolerance Patient tolerated treatment well           Past Medical History:  Diagnosis Date  . Anxiety state, unspecified   . Atypical mole 2008  . Complication of anesthesia    had some difficulty voiding  . Dermatitis    anal dermatitis  . Hemorrhoids   . Herpes simplex without mention of complication   . 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 Numbness and tingling   . Other and unspecified anterior pituitary hyperfunction   . Sleep apnea   . Unspecified asthma(493.90)    controlled    Past Surgical History:  Procedure Laterality Date  . APPENDECTOMY    . KNEE ARTHROPLASTY Left   . KNEE ARTHROSCOPY  09/01/2011   Procedure: ARTHROSCOPY KNEE;  Surgeon: Alta Corning;  Location: [facility];  Service: Orthopedics;  Laterality: Left;  medial and lateral meniscus tear debridment  and chondroplasty  . LASIK    . ORCHIECTOMY     Lt. 08/2009    There were no vitals filed for this visit.   Subjective Assessment - 05/22/20 1518    Subjective Doing stretching every day.  I like these ex's.  I do the bird dogs on my fists b/c I have a long time problem with my wrists.    Pertinent History knee mensicus surgery;  Prefers to be called "[name]"    Currently in Pain? No/denies    Pain Score 0-No pain    Pain Location Back                              [facility] Adult PT Treatment/Exercise - 05/22/20 0001      Therapeutic Activites    Therapeutic Activities Other Therapeutic Activities    Other Therapeutic Activities hip hinge with golf club 3 points of contact 20x       Lumbar Exercises: Standing   Other Standing Lumbar Exercises red band Pallof series: kickstand and SLS 5x each right/left     Other Standing Lumbar Exercises Capt Morgans 7x 7 sec hold 7/20 effort level right/left ball to wall       Lumbar Exercises: Seated   Sit to Stand 10 reps    Sit to Stand Limitations with golf club    Other Seated Lumbar Exercises neural floss 5      Lumbar Exercises: Supine   Ab Set 5 reps    Other Supine Lumbar Exercises neural floss 3x 5 right/left    HS cramp      Lumbar Exercises: Sidelying   Clam Limitations add band       Lumbar Exercises: Prone   Other Prone Lumbar Exercises over 1 pillow: multifidi press with hip extension 5x right/left     Other Prone  Lumbar Exercises UEs Ws and Ts 5x each with multifidi press       Lumbar Exercises: Quadruped   Other Quadruped Lumbar Exercises on elbows/forearms UE/LE lift 5x each                   PT Education - 05/22/20 1622    Education Details prone multifidi with hip extension; Capt Morgans, Capt morgans; hip hinge with golf club    Person(s) Educated Patient    Methods Explanation;Demonstration;Handout    Comprehension Returned demonstration;Verbalized understanding            PT Short Term Goals - 05/15/20 2154      PT SHORT TERM GOAL #1   Title The patient will demonstrate good carryover with intial HEP    Time 4    Period Weeks    Status New    Target Date 06/12/20      PT SHORT TERM GOAL #2   Title The patient will have improved lumbar flexion to 50 degrees, extension to 25 degrees, bil sidebending to 20 degrees for improved mobility with hiking and pickleball    Time 4    Period Weeks    Status New      PT SHORT TERM  GOAL #3   Title The patient will demonstrate good technique with hip hinge needed for standing to wash dishes, and bending over    Time 4    Period Weeks    Status New             PT Long Term Goals - 05/15/20 2157      PT LONG TERM GOAL #1   Title The patient will be independent with safe self progression of HEP    Time 8    Period Weeks    Status New    Target Date 07/10/20      PT LONG TERM GOAL #2   Title The patient will have improved trunk and hip abduction strength to grossly 4+/5 needed for biking, hiking and playing pickleball    Time 8    Period Weeks    Status New      PT LONG TERM GOAL #3   Title The patient will have improved HS lengths hip flexor lengths and improved lumbar flexion to 60 degrees and extension to 30 degrees needed for mobility with biking and pickleball    Time 8    Period Weeks    Status New      PT LONG TERM GOAL #4   Title The patient will demonstrate good technique with lifting as needed for maneuvering suitcase while traveling    Time 8    Period Weeks    Status New      PT LONG TERM GOAL #5   Title FOTO functional outcome score improved from 37% limitation to 32%    Time 8    Period Weeks    Status New                 Plan - 05/22/20 1534    Clinical Impression Statement The patient demonstrates good carryover with activation of transverse abdominus and gluteal activation ex's.  Modified bird dog to elbow/forearm position secondary to wrist pain.  He is able to progress  with lumbo/pelvic/hip stengthening to more challenging positions.  Right hip weakness apparent in single limb standing positions.  He is unable to SLS on right with Pallof, needs touch touch of left toes for added stability.  Good  initial demonstration of hip hinge for pre-lifting.  Therapist monitoring response with all interventions.    Personal Factors and Comorbidities Time since onset of injury/illness/exacerbation    Stability/Clinical Decision Making  Stable/Uncomplicated    Rehab Potential Good    PT Frequency 1x / week    PT Duration 8 weeks    PT Treatment/Interventions ADLs/Self Care Home Management;Cryotherapy;Electrical Stimulation;Traction;Moist Heat;Neuromuscular re-education;Therapeutic exercise;Therapeutic activities;Patient/family education;Manual techniques;Dry needling;Taping    PT Next Visit Plan lumbo/pelvic/hip progression;  try half kneeling Pallof and hip flexor stretch;  review hip hinge/lifting and add weight; pt would like to follow up every other week rather than weekly    PT [place]           Patient will benefit from skilled therapeutic intervention in order to improve the following deficits and impairments:  Decreased range of motion, Pain, Decreased activity tolerance, Decreased strength  Visit Diagnosis: Chronic right-sided low back pain with right-sided sciatica  Muscle weakness (generalized)     Problem List Patient Active Problem List   Diagnosis Date Noted  . Abnormal transaminases 08/08/2019  . Viral respiratory illness 12/24/2018  . Annual physical exam 08/22/2018  . Tear of LCL (lateral collateral ligament) of knee, left, initial encounter 08/18/2017  . Lumbar radiculopathy 01/20/2017  . Paresthesia 08/18/2016  . Low serum testosterone 12/16/2015  . Metatarsalgia of left foot 09/13/2013  . OSA (obstructive sleep apnea) 09/30/2011  . Hypogonadism male 07/06/2011  . Herpes simplex virus (HSV) infection 12/11/2010  . Anxiety state 12/11/2010  . Asthma 12/11/2010  . SEMINOMA 12/11/2009   Ruben Im, PT 05/22/20 4:31 PM Phone: [phone] Fax: [phone] Alvera Singh 05/22/2020, 4:31 PM  [facility] [address] [place], [place], [address] Phone: [phone]   Fax:  [phone]  Name: JIE STICKELS MRN: [id number] Date of Birth: 21-Dec-1960

## 2020-05-29 ENCOUNTER — Ambulatory Visit: Payer: 59 | Admitting: Physical Therapy

## 2020-06-02 MED FILL — ADVAIR 100/50 DISKUS: 100-50 | 30 days supply | Qty: 60 | Fill #2

## 2020-06-05 ENCOUNTER — Encounter: Payer: 59 | Admitting: Physical Therapy

## 2020-06-17 MED FILL — PANTOPRAZOLE SOD DR 40 MG T: 40 | 90 days supply | Qty: 90 | Fill #1

## 2020-06-19 ENCOUNTER — Ambulatory Visit: Payer: 59 | Attending: Family Medicine | Admitting: Physical Therapy

## 2020-06-19 ENCOUNTER — Other Ambulatory Visit: Payer: Self-pay

## 2020-06-19 DIAGNOSIS — G8929 Other chronic pain: Secondary | ICD-10-CM | POA: Insufficient documentation

## 2020-06-19 DIAGNOSIS — M5441 Lumbago with sciatica, right side: Secondary | ICD-10-CM | POA: Insufficient documentation

## 2020-06-19 DIAGNOSIS — M6281 Muscle weakness (generalized): Secondary | ICD-10-CM | POA: Insufficient documentation

## 2020-06-19 NOTE — Patient Instructions (Signed)
Access Code: [email or web address] URL: [email or web address] Date: 06/19/2020 Prepared by: Ruben Im  Exercises Supine Transversus Abdominis Bracing - Hands on Stomach - 1 x daily - 7 x weekly - 1 sets - 10 reps Hooklying Isometric Hip Flexion with Opposite Arm - 1 x daily - 7 x weekly - 1 sets - 10 reps Seated Transversus Abdominis Bracing - 1 x daily - 7 x weekly - 1 sets - 10 reps Standing Transverse Abdominis Contraction - 1 x daily - 7 x weekly - 1 sets - 10 reps Bird Dog - 1 x daily - 7 x weekly - 1 sets - 10 reps Clamshell - 1 x daily - 7 x weekly - 1 sets - 10 reps Sit to Stand without Arm Support - 1 x daily - 7 x weekly - 1 sets - 10 reps Seated Slump Nerve Glide - 1 x daily - 7 x weekly - 1 sets - 10 reps Prone Hip Extension - One Pillow - 1 x daily - 7 x weekly - 1 sets - 10 reps Standing Isometric Hip Abduction with Ball on Wall - 1 x daily - 7 x weekly - 1 sets - 7 reps - 7 hold Standing Anti-Rotation Press with Anchored Resistance - 1 x daily - 7 x weekly - 1 sets - 10 reps Standing Hip Hinge with Dowel - 1 x daily - 7 x weekly - 1 sets - 10 reps Hooklying Hamstring Stretch with Strap - 1 x daily - 7 x weekly - 1 sets - 3 reps - 20 hold Stir the Pot - 1 x daily - 7 x weekly - 1 sets - 10 reps Half Kneeling Anti-Rotation Press - Forward Leg Opposite Anchor Side - 1 x daily - 7 x weekly - 1 sets - 10 reps Standing Single Leg Heel Raise - 1 x daily - 7 x weekly - 1 sets - 10 reps Standing Isometric Hip Abduction with Mini Squat and Ball on Wall - 1 x daily - 7 x weekly - 1 sets - 10 reps

## 2020-06-19 NOTE — Therapy (Signed)
[facility] [address] [place], [place], [address] Phone: [phone]   Fax:  [phone]  Physical Therapy Treatment  Patient Details  Name: Daniel Lam MRN: [id number] Date of Birth: Oct 30, 1960 Referring Provider (PT): Lyndal Pulley DO   Encounter Date: 06/19/2020   PT End of Session - 06/19/20 1027    Visit Number 3    Number of Visits 25    Date for PT Re-Evaluation 07/10/20    Authorization Type UMR 25 visits    PT Start Time 0728    PT Stop Time 0803   30 min treatment slot   PT Time Calculation (min) 35 min    Activity Tolerance Patient tolerated treatment well           Past Medical History:  Diagnosis Date  . Anxiety state, unspecified   . Atypical mole 2008  . Complication of anesthesia    had some difficulty voiding  . Dermatitis    anal dermatitis  . Hemorrhoids   . Herpes simplex without mention of complication   . 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 Numbness and tingling   . Other and unspecified anterior pituitary hyperfunction   . Sleep apnea   . Unspecified asthma(493.90)    controlled    Past Surgical History:  Procedure Laterality Date  . APPENDECTOMY    . KNEE ARTHROPLASTY Left   . KNEE ARTHROSCOPY  09/01/2011   Procedure: ARTHROSCOPY KNEE;  Surgeon: Alta Corning;  Location: [facility];  Service: Orthopedics;  Laterality: Left;  medial and lateral meniscus tear debridment  and chondroplasty  . LASIK    . ORCHIECTOMY     Lt. 08/2009    There were no vitals filed for this visit.   Subjective Assessment - 06/19/20 0728    Subjective Went hiking in [place] with minimal difficulty.  Feels a little weaker in right foot.  Minimal LBP or right LE symptoms with hiking or normal ADLS.  No symptoms at present.    Pertinent History knee mensicus surgery;  Prefers to be called "[name]"    Patient Stated Goals Hoping to get  back stronger so can do pickleball, bike, hike    Currently in Pain? No/denies    Pain Score 0-No pain              [facility] PT Assessment - 06/19/20 0001      Strength   Right Hip Extension 5/5    Right Hip ABduction 4/5    Left Hip Extension 5/5    Left Hip ABduction 5/5    Lumbar Flexion 4/5    Lumbar Extension 4/5      Special Tests   Lumbar Tests --   no radicular symptoms with supine SLR or with seated flossin                        [facility] Adult PT Treatment/Exercise - 06/19/20 0001      Lumbar Exercises: Stretches   Active Hamstring Stretch Left;3 reps;20 seconds    Active Hamstring Stretch Limitations supine strap     with bias side to side     Lumbar Exercises: Standing   Heel Raises 10 reps    Heel Raises Limitations straight and biased toes in and out     Lifting From 12";5 reps    Lifting Weights (lbs) 2 7# dumbells  Lifting Limitations hip hinge with and without golf club for tactile feedback     Other Standing Lumbar Exercises red band Pallof series SLS 5x each right/left with stir the pot     Other Standing Lumbar Exercises [place] with progressions of mini squat  7x 7 sec hold 7/20 effort level right/left ball to wall       Lumbar Exercises: Seated   Other Seated Lumbar Exercises neural floss 10x right/left       Lumbar Exercises: Quadruped   Other Quadruped Lumbar Exercises 1/2 kneel with red band stir the pot 30 sec right/left                   PT Education - 06/19/20 1026    Education Details supine HS stretch with strap;  heel raises with toes in and out; wall hip isometric with mini squat;  1/2 kneel stir the pot    Person(s) Educated Patient    Methods Explanation;Demonstration;Handout    Comprehension Verbalized understanding;Returned demonstration            PT Short Term Goals - 06/19/20 1035      PT SHORT TERM GOAL #1   Title The patient will demonstrate good carryover with intial HEP    Status Achieved        PT SHORT TERM GOAL #2   Title The patient will have improved lumbar flexion to 50 degrees, extension to 25 degrees, bil sidebending to 20 degrees for improved mobility with hiking and pickleball    Time 4    Period Weeks    Status On-going      PT SHORT TERM GOAL #3   Title The patient will demonstrate good technique with hip hinge needed for standing to wash dishes, and bending over    Status Achieved             PT Long Term Goals - 05/15/20 2157      PT LONG TERM GOAL #1   Title The patient will be independent with safe self progression of HEP    Time 8    Period Weeks    Status New    Target Date 07/10/20      PT LONG TERM GOAL #2   Title The patient will have improved trunk and hip abduction strength to grossly 4+/5 needed for biking, hiking and playing pickleball    Time 8    Period Weeks    Status New      PT LONG TERM GOAL #3   Title The patient will have improved HS lengths hip flexor lengths and improved lumbar flexion to 60 degrees and extension to 30 degrees needed for mobility with biking and pickleball    Time 8    Period Weeks    Status New      PT LONG TERM GOAL #4   Title The patient will demonstrate good technique with lifting as needed for maneuvering suitcase while traveling    Time 8    Period Weeks    Status New      PT LONG TERM GOAL #5   Title FOTO functional outcome score improved from 37% limitation to 32%    Time 8    Period Weeks    Status New                 Plan - 06/19/20 0800    Clinical Impression Statement The patient reports low symptom irritability with traveling and hiking to [place].  He demonstrates good HEP compliance but reports finding time is challenging.  He requests input on which exercises are most valuable to help streamline the process.  He demonstrates improved stability with SLS although right hip muscles significantly weaker than left with compensatory trunk lean and pelvic drop noted with more challenging  ex's.  Verbal cues with hip hinge and modified dead lifting needed.  Progressing with rehab goals.    Personal Factors and Comorbidities Time since onset of injury/illness/exacerbation    Examination-Activity Limitations Other;Lift    Stability/Clinical Decision Making Stable/Uncomplicated    Rehab Potential Good    PT Frequency 1x / week    PT Duration 8 weeks    PT Treatment/Interventions ADLs/Self Care Home Management;Cryotherapy;Electrical Stimulation;Traction;Moist Heat;Neuromuscular re-education;Therapeutic exercise;Therapeutic activities;Patient/family education;Manual techniques;Dry needling;Taping    PT Next Visit Plan lumbo/pelvic/hip progression;    review hip hinge/lifting and add weight;   recheck lumbar ROM    PT Home Exercise Plan [id number]           Patient will benefit from skilled therapeutic intervention in order to improve the following deficits and impairments:  Decreased range of motion, Pain, Decreased activity tolerance, Decreased strength  Visit Diagnosis: Chronic right-sided low back pain with right-sided sciatica  Muscle weakness (generalized)     Problem List Patient Active Problem List   Diagnosis Date Noted  . Abnormal transaminases 08/08/2019  . Viral respiratory illness 12/24/2018  . Annual physical exam 08/22/2018  . Tear of LCL (lateral collateral ligament) of knee, left, initial encounter 08/18/2017  . Lumbar radiculopathy 01/20/2017  . Paresthesia 08/18/2016  . Low serum testosterone 12/16/2015  . Metatarsalgia of left foot 09/13/2013  . OSA (obstructive sleep apnea) 09/30/2011  . Hypogonadism male 07/06/2011  . Herpes simplex virus (HSV) infection 12/11/2010  . Anxiety state 12/11/2010  . Asthma 12/11/2010  . SEMINOMA 12/11/2009   Ruben Im, PT 06/19/20 10:36 AM Phone: [phone] Fax: [phone] Alvera Singh 06/19/2020, 10:36 AM  [facility] [address], [place], [place], [address] Phone: [phone]   Fax:  [phone]  Name: DIMAS SCHECK MRN: [id number] Date of Birth: 1961-07-02

## 2020-06-26 DIAGNOSIS — G4733 Obstructive sleep apnea (adult) (pediatric): Secondary | ICD-10-CM | POA: Diagnosis not present

## 2020-07-03 ENCOUNTER — Other Ambulatory Visit: Payer: Self-pay

## 2020-07-03 ENCOUNTER — Ambulatory Visit: Payer: 59 | Admitting: Physical Therapy

## 2020-07-03 DIAGNOSIS — M6281 Muscle weakness (generalized): Secondary | ICD-10-CM

## 2020-07-03 DIAGNOSIS — G8929 Other chronic pain: Secondary | ICD-10-CM

## 2020-07-03 DIAGNOSIS — M5441 Lumbago with sciatica, right side: Secondary | ICD-10-CM | POA: Diagnosis not present

## 2020-07-03 NOTE — Therapy (Addendum)
[facility] [address] [place], [place], [address] Phone: [phone]   Fax:  [phone]  Physical Therapy Treatment/Discharge Summary   Patient Details  Name: [place] MRN: [id number] Date of Birth: 06-06-1961 Referring Provider (PT): Lyndal Pulley DO   Encounter Date: 07/03/2020   PT End of Session - 07/03/20 1858    Visit Number 4    Number of Visits 25    Date for PT Re-Evaluation 07/10/20    Authorization Type UMR 25 visits    PT Start Time 0731    PT Stop Time 0801   30 min slot   PT Time Calculation (min) 30 min    Activity Tolerance Patient tolerated treatment well           Past Medical History:  Diagnosis Date  . Anxiety state, unspecified   . Atypical mole 2008  . Complication of anesthesia    had some difficulty voiding  . Dermatitis    anal dermatitis  . Hemorrhoids   . Herpes simplex without mention of complication   . 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 Numbness and tingling   . Other and unspecified anterior pituitary hyperfunction   . Sleep apnea   . Unspecified asthma(493.90)    controlled    Past Surgical History:  Procedure Laterality Date  . APPENDECTOMY    . KNEE ARTHROPLASTY Left   . KNEE ARTHROSCOPY  09/01/2011   Procedure: ARTHROSCOPY KNEE;  Surgeon: Alta Corning;  Location: [place];  Service: Orthopedics;  Laterality: Left;  medial and lateral meniscus tear debridment  and chondroplasty  . LASIK    . ORCHIECTOMY     Lt. 08/2009    There were no vitals filed for this visit.   Subjective Assessment - 07/03/20 0733    Subjective Been working.  Looking forward to a few days off next week.  No pain while biking but 2 days later the pain returns.  Exercises 3x/day.    Pertinent History knee mensicus surgery;  Prefers to be called "[name]"    Pain Location Back              [facility] PT Assessment -  07/03/20 0001      Observation/Other Assessments   Focus on Therapeutic Outcomes (FOTO)  30%      AROM   Lumbar Flexion 60    Lumbar Extension 30    Lumbar - Right Side Bend 25    Lumbar - Left Side Bend 25                         [facility] Adult PT Treatment/Exercise - 07/03/20 0001      Therapeutic Activites    Other Therapeutic Activities hip hinge with golf club 3 points of contact 20x       Lumbar Exercises: Stretches   Other Lumbar Stretch Exercise wall flexion/extension for warm up       Lumbar Exercises: Standing   Lifting Weights (lbs) 10#, 25#, 30# bar to knee and ankle level 10x each     Other Standing Lumbar Exercises 1/2 kneel on BOSU with Pallof movements red band     Other Standing Lumbar Exercises standing on BOSU with UE movements       Lumbar Exercises: Seated   Other Seated Lumbar Exercises review of previous HEP to discuss progression, modifications  PT Short Term Goals - 06/19/20 1035      PT SHORT TERM GOAL #1   Title The patient will demonstrate good carryover with intial HEP    Status Achieved      PT SHORT TERM GOAL #2   Title The patient will have improved lumbar flexion to 50 degrees, extension to 25 degrees, bil sidebending to 20 degrees for improved mobility with hiking and pickleball    Time 4    Period Weeks    Status On-going      PT SHORT TERM GOAL #3   Title The patient will demonstrate good technique with hip hinge needed for standing to wash dishes, and bending over    Status Achieved             PT Long Term Goals - 07/03/20 2134      PT LONG TERM GOAL #1   Title The patient will be independent with safe self progression of HEP    Time 8    Period Weeks    Status On-going      PT LONG TERM GOAL #2   Title The patient will have improved trunk and hip abduction strength to grossly 4+/5 needed for biking, hiking and playing pickleball    Time 8    Period Weeks    Status On-going       PT LONG TERM GOAL #3   Title The patient will have improved HS lengths hip flexor lengths and improved lumbar flexion to 60 degrees and extension to 30 degrees needed for mobility with biking and pickleball    Status Partially Met      PT LONG TERM GOAL #4   Title The patient will demonstrate good technique with lifting as needed for maneuvering suitcase while traveling    Status Achieved      PT LONG TERM GOAL #5   Title FOTO functional outcome score improved from 37% limitation to 32%    Status Achieved                 Plan - 07/03/20 1859    Clinical Impression Statement The patient has significantly improved ROM in all planes.  FOTO functional outcome score improved as well from 37% on initial eval to 30% today.  Good carryover with hip hinge with progression to dead lifting although needs some verbal  cues for technique with increased loads of 25# and 30#.  Good overall carryover and compliance with HEP although he reports there is a time factor with completing them more than 3x/week.   Patient would like to follow up in > 1 month but will call sooner if problems arise.    Personal Factors and Comorbidities Time since onset of injury/illness/exacerbation    Rehab Potential Good    PT Frequency 1x / week    PT Duration 8 weeks    PT Treatment/Interventions ADLs/Self Care Home Management;Cryotherapy;Electrical Stimulation;Traction;Moist Heat;Neuromuscular re-education;Therapeutic exercise;Therapeutic activities;Patient/family education;Manual techniques;Dry needling;Taping    PT Next Visit Plan follow up in 1 month;  lumbo/pelvic/hip progression;    review dead lifting    PT [place]           Patient will benefit from skilled therapeutic intervention in order to improve the following deficits and impairments:  Decreased range of motion, Pain, Decreased activity tolerance, Decreased strength  Visit Diagnosis: Chronic right-sided low back pain with  right-sided sciatica  Muscle weakness (generalized)    PHYSICAL THERAPY DISCHARGE SUMMARY  Visits from  Start of Care: 4  Current functional level related to goals / functional outcomes: The patient rescheduled his last few appts but states he is doing well.   He discussed with the receptionist and it was decided that he could be discharged from PT at this time.  We would be happy to see him at a later date  if needed with a new referral.     Remaining deficits: As above   Education / Equipment: HEP Plan: Patient agrees to discharge.  Patient goals were partially met. Patient is being discharged due to the patient's request.  ?????         Problem List Patient Active Problem List   Diagnosis Date Noted  . Abnormal transaminases 08/08/2019  . Viral respiratory illness 12/24/2018  . Annual physical exam 08/22/2018  . Tear of LCL (lateral collateral ligament) of knee, left, initial encounter 08/18/2017  . Lumbar radiculopathy 01/20/2017  . Paresthesia 08/18/2016  . Low serum testosterone 12/16/2015  . Metatarsalgia of left foot 09/13/2013  . OSA (obstructive sleep apnea) 09/30/2011  . Hypogonadism male 07/06/2011  . Herpes simplex virus (HSV) infection 12/11/2010  . Anxiety state 12/11/2010  . Asthma 12/11/2010  . SEMINOMA 12/11/2009   Ruben Im, PT 07/03/20 9:35 PM Phone: [phone] Fax: [phone]  Alvera Singh 07/03/2020, 9:35 PM  [facility] [address] [place], [place], [address] Phone: [phone]   Fax:  [phone]  Name: [place] MRN: [id number] Date of Birth: 03/04/1961

## 2020-07-17 ENCOUNTER — Encounter: Payer: 59 | Admitting: Physical Therapy

## 2020-07-24 ENCOUNTER — Encounter: Payer: 59 | Admitting: Physical Therapy

## 2020-07-24 ENCOUNTER — Ambulatory Visit: Payer: 59 | Admitting: Psychiatry

## 2020-08-04 ENCOUNTER — Other Ambulatory Visit: Payer: Self-pay | Admitting: Psychiatry

## 2020-08-06 ENCOUNTER — Other Ambulatory Visit: Payer: Self-pay | Admitting: Psychiatry

## 2020-08-06 MED FILL — FLUoxetine HCL 20 MG CAPS: 20 | 90 days supply | Qty: 90 | Fill #0

## 2020-08-08 MED FILL — MONTELUKAST SOD 10 MG TAB: 10 | 90 days supply | Qty: 90 | Fill #1

## 2020-08-13 MED FILL — ADVAIR 100/50 DISKUS: 100-50 | 30 days supply | Qty: 60 | Fill #3

## 2020-08-14 ENCOUNTER — Encounter: Payer: 59 | Admitting: Physical Therapy

## 2020-09-08 ENCOUNTER — Ambulatory Visit: Payer: 59 | Attending: Internal Medicine

## 2020-09-08 DIAGNOSIS — Z23 Encounter for immunization: Secondary | ICD-10-CM

## 2020-09-09 NOTE — Progress Notes (Signed)
   Covid-19 Vaccination Clinic  Name:  [place]    MRN: [id number] DOB: 1961/07/07  09/09/2020  [name] was observed post Covid-19 immunization for 15 minutes without incident. He was provided with Vaccine Information Sheet and instruction to access the V-Safe system.   [name] was instructed to call 911 with any severe reactions post vaccine: Marland Kitchen Difficulty breathing  . Swelling of face and throat  . A fast heartbeat  . A bad rash all over body  . Dizziness and weakness   Immunizations Administered    Name Date Dose VIS Date Route   Pfizer COVID-19 Vaccine 09/08/2020  5:16 PM 0.3 mL 07/30/2020 Intramuscular   Manufacturer: [place]   Lot: [id number]   [place]: [id number]

## 2020-09-11 ENCOUNTER — Other Ambulatory Visit: Payer: Self-pay

## 2020-09-11 ENCOUNTER — Encounter: Payer: Self-pay | Admitting: Pulmonary Disease

## 2020-09-11 ENCOUNTER — Ambulatory Visit (INDEPENDENT_AMBULATORY_CARE_PROVIDER_SITE_OTHER): Payer: 59 | Admitting: Pulmonary Disease

## 2020-09-11 VITALS — BP 154/80 | HR 68 | Temp 97.3°F | Ht 70.0 in | Wt 220.6 lb

## 2020-09-11 DIAGNOSIS — G4733 Obstructive sleep apnea (adult) (pediatric): Secondary | ICD-10-CM | POA: Diagnosis not present

## 2020-09-11 DIAGNOSIS — R590 Localized enlarged lymph nodes: Secondary | ICD-10-CM | POA: Diagnosis not present

## 2020-09-11 NOTE — Progress Notes (Signed)
Daniel Lam    [id number]    1961/07/25  Primary Care Physician:Lowne Cheri Rous Alferd Apa, DO  Referring Physician: Carollee Herter, Alferd Apa, DO [address] [place],  [place] [id number]  Chief complaint:   Patient with obstructive sleep apnea  HPI:  Obstructive sleep apnea Very compliant with CPAP use Continues to benefit from CPAP use He is having some mask issues  Last visit here was many years ago  CPAP continues to work well, uses it nightly Compliance does reveal 96% compliance with CPAP use with optimal treatment  He has felt adenopathy around the supraclavicular area for a couple of weeks, did not have any respiratory tract infection or nasal symptoms  Currently on a CPAP of 13  Usual bedtime about 1030, wake up time between 630 and seven Couple cups of coffee in the morning to get him going Uses Xanax, fluoxetine  Outpatient Encounter Medications as of 09/11/2020  Medication Sig  . acetaminophen (TYLENOL) 500 MG tablet Take 500 mg by mouth every 6 (six) hours as needed.  . cholecalciferol (VITAMIN D3) 25 MCG (1000 UT) tablet Take 1,000 Units by mouth daily.  . cromolyn (OPTICROM) 4 % ophthalmic solution Place 2 drops into both eyes 4 (four) times daily. (Patient taking differently: Place 2 drops into both eyes 4 (four) times daily as needed. )  . FLUoxetine (PROZAC) 20 MG capsule TAKE 1 CAPSULE BY MOUTH DAILY.  . fluticasone (CUTIVATE) 0.005 % ointment APPLY 1 APPLICATION TOPICALLY 2 TIMES DAILY. (Patient taking differently: 2 (two) times daily as needed. )  . Fluticasone-Salmeterol (ADVAIR) 100-50 MCG/DOSE AEPB Inhale 1 puff into the lungs 2 (two) times daily.  Marland Kitchen ibuprofen (ADVIL,MOTRIN) 200 MG tablet Take 200 mg by mouth every 6 (six) hours as needed.  Marland Kitchen ketoconazole (NIZORAL) 2 % cream Apply 1 application topically 2 (two) times daily.  Marland Kitchen lidocaine (LINDAMANTLE) 3 % CREA cream APPLY 1 APPLICATION TOPICALLY AS NEEDED.  Marland Kitchen lidocaine (XYLOCAINE) 5 %  ointment Apply 1 application topically as needed.  . montelukast (SINGULAIR) 10 MG tablet Take 1 tablet (10 mg total) by mouth at bedtime.  . NONFORMULARY OR COMPOUNDED ITEM RESMED Standard Air Tubing for CPAP  . NONFORMULARY OR COMPOUNDED ITEM ResMed Swift FX nasal pillows mask (Medium)  . pantoprazole (PROTONIX) 40 MG tablet Take 1 tablet (40 mg total) by mouth daily.  . prednisoLONE acetate (PRED FORTE) 1 % ophthalmic suspension Place 2 drops into both eyes 4 (four) times daily.  . predniSONE (DELTASONE) 10 MG tablet TAKE 4 TABLETS PO QD FOR 3 DAYS THEN TAKE 3 TABLETS PO QD FOR 3 DAYS THEN TAKE 2 TABLET PO QD FOR 3 DAYS THEN TAKE 11 TAB PO QD FOR 3 DAYS then 1/2 po qd for 3 days  . predniSONE (DELTASONE) 10 MG tablet TAKE 3 TABLETS PO QD FOR 3 DAYS THEN TAKE 2 TABLETS PO QD FOR 3 DAYS THEN TAKE 1 TABLET PO QD FOR 3 DAYS THEN TAKE 1/2 TAB PO QD FOR 3 DAYS  . pseudoephedrine (SUDAFED) 60 MG tablet Take 1 tablet (60 mg total) by mouth every 4 (four) hours as needed for congestion.  . valACYclovir (VALTREX) 1000 MG tablet TAKE 1 TABLET (1,000 MG TOTAL) BY MOUTH DAILY.  . VENTOLIN HFA 108 (90 Base) MCG/ACT inhaler INHALE 2 PUFFS BY MOUTH INTO THE LUNGS FOUR TIMES DAILY  . ALPRAZolam (XANAX) 0.5 MG tablet TAKE 1 TABLET (0.5 MG TOTAL) BY MOUTH 3 (  THREE) TIMES DAILY AS NEEDED. (Patient not taking: Reported on 05/15/2020)  . buPROPion (WELLBUTRIN XL) 150 MG 24 hr tablet 1 in the morning for a week, then 2 each morning (Patient not taking: Reported on 05/15/2020)   No facility-administered encounter medications on file as of 09/11/2020.    Allergies as of 09/11/2020 - Review Complete 09/11/2020  Allergen Reaction Noted  . Demerol [meperidine hcl]  08/27/2011  . Food  08/22/2018  . Latex    . Morphine and related  08/27/2011    Past Medical History:  Diagnosis Date  . Anxiety state, unspecified   . Atypical mole 2008  . Complication of anesthesia    had some difficulty voiding  . Dermatitis     anal dermatitis  . Hemorrhoids   . Herpes simplex without mention of complication   . 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 Numbness and tingling   . Other and unspecified anterior pituitary hyperfunction   . Sleep apnea   . Unspecified asthma(493.90)    controlled    Past Surgical History:  Procedure Laterality Date  . APPENDECTOMY    . KNEE ARTHROPLASTY Left   . KNEE ARTHROSCOPY  09/01/2011   Procedure: ARTHROSCOPY KNEE;  Surgeon: Alta Corning;  Location: [facility];  Service: Orthopedics;  Laterality: Left;  medial and lateral meniscus tear debridment  and chondroplasty  . LASIK    . ORCHIECTOMY     Lt. 08/2009    Family History  Problem Relation Age of Onset  . Sjogren's syndrome Mother   . COPD Mother   . Colon polyps Father   . Dementia Father   . Throat cancer Father   . Melanoma Brother   . Colon cancer Neg Hx   . Esophageal cancer Neg Hx   . Stomach cancer Neg Hx   . Rectal cancer Neg Hx   . Liver disease Neg Hx     Social History   Socioeconomic History  . Marital status: Married    Spouse name: Not on file  . Number of children: 3  . Years of education: MD  . Highest education level: Not on file  Occupational History  . Occupation: physician    Comment: internal medicine  Tobacco Use  . Smoking status: Current Some Day Smoker    Packs/day: 0.25    Types: Cigarettes, Cigars  . Smokeless tobacco: Never Used  . Tobacco comment: Occasional cigar  Vaping Use  . Vaping Use: Never used  Substance and Sexual Activity  . Alcohol use: Yes    Alcohol/week: 0.0 standard drinks    Comment: occ  . Drug use: No  . Sexual activity: Yes    Partners: Female  Other Topics Concern  . Not on file  Social History Narrative   Regular exercise: yes, very active   Caffeine use: 2 cups of coffee and soda's daily.   Right-handed.   Social Determinants of Health   Financial Resource Strain:   .  Difficulty of Paying Living Expenses: Not on file  Food Insecurity:   . Worried About Charity fundraiser in the Last Year: Not on file  . Ran Out of Food in the Last Year: Not on file  Transportation Needs:   . Lack of Transportation (Medical): Not on file  . Lack of Transportation (Non-Medical): Not on file  Physical Activity:   . Days of Exercise per Week: Not on file  . Minutes  of Exercise per Session: Not on file  Stress:   . Feeling of Stress : Not on file  Social Connections:   . Frequency of Communication with Friends and Family: Not on file  . Frequency of Social Gatherings with Friends and Family: Not on file  . Attends Religious Services: Not on file  . Active Member of Clubs or Organizations: Not on file  . Attends Archivist Meetings: Not on file  . Marital Status: Not on file  Intimate Partner Violence:   . Fear of Current or Ex-Partner: Not on file  . Emotionally Abused: Not on file  . Physically Abused: Not on file  . Sexually Abused: Not on file    Review of Systems  Respiratory: Positive for apnea.   Psychiatric/Behavioral: Positive for sleep disturbance.  All other systems reviewed and are negative.  Vitals:   09/11/20 1543  BP: (!) 154/80  Pulse: 68  Temp: (!) 97.3 F (36.3 C)  SpO2: 98%   Physical Exam Constitutional:      Appearance: He is obese.  HENT:     Head: Normocephalic.     Mouth/Throat:     Mouth: Mucous membranes are moist.  Eyes:     General:        Right eye: No discharge.        Left eye: No discharge.     Pupils: Pupils are equal, round, and reactive to light.  Cardiovascular:     Rate and Rhythm: Normal rate and regular rhythm.     Heart sounds: No murmur heard.  No friction rub.  Pulmonary:     Effort: No respiratory distress.     Breath sounds: No stridor. No wheezing or rhonchi.  Musculoskeletal:     Cervical back: No rigidity or tenderness.  Neurological:     Mental Status: He is alert.  Psychiatric:          Mood and Affect: Mood normal.    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

## 2020-09-11 NOTE — Patient Instructions (Signed)
CT chest with contrast -Supraclavicular lymph nodes -History of testicular cancer -Previously CTs have been negative  Obstructive sleep apnea -We will contact the DME company for CPAP supplies  -Following mask change if pressure changes required, give [place] a call  -I will see you back in 6 months

## 2020-09-18 ENCOUNTER — Encounter: Payer: 59 | Admitting: Physical Therapy

## 2020-09-22 MED FILL — PANTOPRAZOLE SOD DR 40 MG T: 40 | 90 days supply | Qty: 90 | Fill #2

## 2020-09-23 ENCOUNTER — Other Ambulatory Visit: Payer: 59

## 2020-09-29 ENCOUNTER — Ambulatory Visit (INDEPENDENT_AMBULATORY_CARE_PROVIDER_SITE_OTHER)
Admission: RE | Admit: 2020-09-29 | Discharge: 2020-09-29 | Disposition: A | Discharge status: Home / Self Care | Payer: 59 | Source: Ambulatory Visit | Attending: Pulmonary Disease | Admitting: Pulmonary Disease

## 2020-09-29 ENCOUNTER — Other Ambulatory Visit: Payer: Self-pay

## 2020-09-29 DIAGNOSIS — R590 Localized enlarged lymph nodes: Secondary | ICD-10-CM

## 2020-09-29 DIAGNOSIS — R911 Solitary pulmonary nodule: Secondary | ICD-10-CM | POA: Diagnosis not present

## 2020-09-29 DIAGNOSIS — R599 Enlarged lymph nodes, unspecified: Secondary | ICD-10-CM | POA: Diagnosis not present

## 2020-09-29 MED ORDER — IOHEXOL 300 MG/ML  SOLN
80.0000 mL | Freq: Once | INTRAMUSCULAR | Status: AC | PRN
  Administered 2020-09-29: 80 mL via INTRAVENOUS

## 2020-09-30 ENCOUNTER — Other Ambulatory Visit: Payer: 59

## 2020-10-09 ENCOUNTER — Encounter: Payer: Self-pay | Admitting: Psychiatry

## 2020-10-09 ENCOUNTER — Other Ambulatory Visit: Payer: Self-pay

## 2020-10-09 ENCOUNTER — Ambulatory Visit (INDEPENDENT_AMBULATORY_CARE_PROVIDER_SITE_OTHER): Payer: 59 | Admitting: Psychiatry

## 2020-10-09 ENCOUNTER — Other Ambulatory Visit: Payer: Self-pay | Admitting: Psychiatry

## 2020-10-09 DIAGNOSIS — F411 Generalized anxiety disorder: Secondary | ICD-10-CM

## 2020-10-09 DIAGNOSIS — F5105 Insomnia due to other mental disorder: Secondary | ICD-10-CM | POA: Diagnosis not present

## 2020-10-09 MED ORDER — FLUOXETINE HCL 20 MG PO CAPS: 20.0000 mg | ORAL_CAPSULE | Freq: Every day | ORAL | 3 refills | Status: DC

## 2020-10-09 NOTE — Progress Notes (Signed)
Daniel Lam [id number] 1961/05/03 59 y.o.  Subjective:   Patient ID:  Daniel Lam is a 59 y.o. (DOB 1960/11/07) male.  Chief Complaint:  Chief Complaint  Patient presents with  . Follow-up    HPI Daniel Lam presents to the office today for follow-up of GAD.  Last seen a year ago without med changes..  Never really tried Wellbutrin bc he didn't want to bother with more pills.  He might consider it.   OK with depression and anxiety.  One of the things that helped was more acceptance.  Happy being a doctor.    No sig changes.  Increased xanax a little over time.  Needed more Xanax to sleep 0.75 mg HS.  Sleep OK with that.  Anxiety is manageable.  Mood and otherwise are the same. Relational stress. Doesn't think meds will fix this.  Use CPAP.    Past Psychiatric Medication Trials: Trintellix SE,  Fluoxetine, sertraline, citalopram, paroxetine, venlafaxine constipation,  Xanax  Review of Systems:  Review of Systems  Cardiovascular: Negative for palpitations.  Neurological: Negative for tremors and weakness.    Medications: I have reviewed the patient's current medications.  Current Outpatient Medications  Medication Sig Dispense Refill  . acetaminophen (TYLENOL) 500 MG tablet Take 500 mg by mouth every 6 (six) hours as needed.    . ALPRAZolam (XANAX) 0.5 MG tablet TAKE 1 TABLET (0.5 MG TOTAL) BY MOUTH 3 (THREE) TIMES DAILY AS NEEDED. 270 tablet 1  . buPROPion (WELLBUTRIN XL) 150 MG 24 hr tablet 1 in the morning for a week, then 2 each morning 30 tablet 0  . cholecalciferol (VITAMIN D3) 25 MCG (1000 UT) tablet Take 1,000 Units by mouth daily.    . cromolyn (OPTICROM) 4 % ophthalmic solution Place 2 drops into both eyes 4 (four) times daily. (Patient taking differently: Place 2 drops into both eyes 4 (four) times daily as needed.) 10 mL 12  . FLUoxetine (PROZAC) 20 MG capsule TAKE 1 CAPSULE BY MOUTH DAILY. 90 capsule 0  . fluticasone (CUTIVATE) 0.005 % ointment APPLY 1 APPLICATION  TOPICALLY 2 TIMES DAILY. (Patient taking differently: 2 (two) times daily as needed.) 30 g 3  . Fluticasone-Salmeterol (ADVAIR) 100-50 MCG/DOSE AEPB Inhale 1 puff into the lungs 2 (two) times daily. 1 each 5  . ibuprofen (ADVIL,MOTRIN) 200 MG tablet Take 200 mg by mouth every 6 (six) hours as needed.    Marland Kitchen ketoconazole (NIZORAL) 2 % cream Apply 1 application topically 2 (two) times daily. 60 g 1  . lidocaine (LINDAMANTLE) 3 % CREA cream APPLY 1 APPLICATION TOPICALLY AS NEEDED. 85 g 3  . lidocaine (XYLOCAINE) 5 % ointment Apply 1 application topically as needed. 30 g 0  . montelukast (SINGULAIR) 10 MG tablet Take 1 tablet (10 mg total) by mouth at bedtime. 90 tablet 3  . NONFORMULARY OR COMPOUNDED ITEM RESMED Standard Air Tubing for CPAP 1 each 11  . NONFORMULARY OR COMPOUNDED ITEM ResMed Swift FX nasal pillows mask (Medium) 1 each 11  . pantoprazole (PROTONIX) 40 MG tablet Take 1 tablet (40 mg total) by mouth daily. 90 tablet 3  . prednisoLONE acetate (PRED FORTE) 1 % ophthalmic suspension Place 2 drops into both eyes 4 (four) times daily. 5 mL 0  . predniSONE (DELTASONE) 10 MG tablet TAKE 4 TABLETS PO QD FOR 3 DAYS THEN TAKE 3 TABLETS PO QD FOR 3 DAYS THEN TAKE 2 TABLET PO QD FOR 3 DAYS THEN TAKE 11 TAB PO QD FOR 3  DAYS then 1/2 po qd for 3 days 35 tablet 0  . pseudoephedrine (SUDAFED) 60 MG tablet Take 1 tablet (60 mg total) by mouth every 4 (four) hours as needed for congestion. 30 tablet 0  . valACYclovir (VALTREX) 1000 MG tablet TAKE 1 TABLET (1,000 MG TOTAL) BY MOUTH DAILY. 90 tablet 3  . VENTOLIN HFA 108 (90 Base) MCG/ACT inhaler INHALE 2 PUFFS BY MOUTH INTO THE LUNGS FOUR TIMES DAILY 18 g 11  . predniSONE (DELTASONE) 10 MG tablet TAKE 3 TABLETS PO QD FOR 3 DAYS THEN TAKE 2 TABLETS PO QD FOR 3 DAYS THEN TAKE 1 TABLET PO QD FOR 3 DAYS THEN TAKE 1/2 TAB PO QD FOR 3 DAYS (Patient not taking: Reported on 10/09/2020) 20 tablet 0   No current facility-administered medications for this visit.     Medication Side Effects: Other: libido     Allergies:  Allergies  Allergen Reactions  . Demerol [Meperidine Hcl]     Difficulty voiding  . Food     Mammals- Alpha Gal Syndrome  . Latex     REACTION: SOB, rash, itching  . Morphine And Related     Difficulty voiding    Past Medical History:  Diagnosis Date  . Anxiety state, unspecified   . Atypical mole 2008  . Complication of anesthesia    had some difficulty voiding  . Dermatitis    anal dermatitis  . Hemorrhoids   . Herpes simplex without mention of complication   . 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 Numbness and tingling   . Other and unspecified anterior pituitary hyperfunction   . Sleep apnea   . Unspecified asthma(493.90)    controlled    Family History  Problem Relation Age of Onset  . Sjogren's syndrome Mother   . COPD Mother   . Colon polyps Father   . Dementia Father   . Throat cancer Father   . Melanoma Brother   . Colon cancer Neg Hx   . Esophageal cancer Neg Hx   . Stomach cancer Neg Hx   . Rectal cancer Neg Hx   . Liver disease Neg Hx     Social History   Socioeconomic History  . Marital status: Married    Spouse name: Not on file  . Number of children: 3  . Years of education: MD  . Highest education level: Not on file  Occupational History  . Occupation: physician    Comment: internal medicine  Tobacco Use  . Smoking status: Current Some Day Smoker    Packs/day: 0.25    Types: Cigarettes, Cigars  . Smokeless tobacco: Never Used  . Tobacco comment: Occasional cigar  Vaping Use  . Vaping Use: Never used  Substance and Sexual Activity  . Alcohol use: Yes    Alcohol/week: 0.0 standard drinks    Comment: occ  . Drug use: No  . Sexual activity: Yes    Partners: Female  Other Topics Concern  . Not on file  Social History Narrative   Regular exercise: yes, very active   Caffeine use: 2 cups of coffee and soda's daily.    Right-handed.   Social Determinants of Health   Financial Resource Strain: Not on file  Food Insecurity: Not on file  Transportation Needs: Not on file  Physical Activity: Not on file  Stress: Not on file  Social Connections: Not on file  Intimate Partner Violence: Not on file  Past Medical History, Surgical history, Social history, and Family history were reviewed and updated as appropriate.   Please see review of systems for further details on the patient's review from today.   Objective:   Physical Exam:  There were no vitals taken for this visit.  Physical Exam Constitutional:      General: He is not in acute distress.    Appearance: He is well-developed.  Musculoskeletal:        General: No deformity.  Neurological:     Mental Status: He is alert and oriented to person, place, and time.     Coordination: Coordination normal.  Psychiatric:        Attention and Perception: Attention and perception normal. He does not perceive auditory or visual hallucinations.        Mood and Affect: Mood normal. Mood is not anxious or depressed. Affect is not labile, blunt, angry or inappropriate.        Speech: Speech normal.        Behavior: Behavior normal. Behavior is not slowed.        Thought Content: Thought content normal. Thought content does not include homicidal or suicidal ideation. Thought content does not include homicidal or suicidal plan.        Cognition and Memory: Cognition and memory normal.        Judgment: Judgment normal.     Comments: Insight intact. No delusions.      Lab Review:     Component Value Date/Time   NA 139 07/16/2019 1228   NA 137 01/17/2018 0000   K 4.0 07/16/2019 1228   CL 104 07/16/2019 1228   CO2 26 07/16/2019 1228   GLUCOSE 96 07/16/2019 1228   BUN 21 07/16/2019 1228   BUN 19 01/17/2018 0000   CREATININE 1.01 07/16/2019 1228   CALCIUM 9.2 07/16/2019 1228   PROT 7.1 08/08/2019 1154   PROT 6.4 09/06/2017 1528   ALBUMIN 4.5  08/08/2019 1154   ALBUMIN 4.3 09/06/2017 1528   AST 27 08/08/2019 1154   ALT 38 08/08/2019 1154   ALKPHOS 70 08/08/2019 1154   BILITOT 0.5 08/08/2019 1154   BILITOT 0.3 09/06/2017 1528   GFRNONAA 83 07/16/2019 1228   GFRAA 96 07/16/2019 1228       Component Value Date/Time   WBC 4.8 07/16/2019 1228   RBC 4.33 07/16/2019 1228   HGB 13.9 07/16/2019 1228   HGB 13.9 09/06/2017 1528   HCT 41.1 07/16/2019 1228   HCT 40.0 09/06/2017 1528   PLT 198 07/16/2019 1228   PLT 203 09/06/2017 1528   MCV 94.9 07/16/2019 1228   MCV 93 09/06/2017 1528   MCH 32.1 07/16/2019 1228   MCHC 33.8 07/16/2019 1228   RDW 12.1 07/16/2019 1228   RDW 13.1 09/06/2017 1528   LYMPHSABS 1,258 07/16/2019 1228   LYMPHSABS 1.2 09/06/2017 1528   MONOABS 0.3 08/17/2016 0740   EOSABS 120 07/16/2019 1228   EOSABS 0.2 09/06/2017 1528   BASOSABS 29 07/16/2019 1228   BASOSABS 0.0 09/06/2017 1528    No results found for: POCLITH, LITHIUM   No results found for: PHENYTOIN, PHENOBARB, VALPROATE, CBMZ   .res Assessment: Plan:    Daniel Lam was seen today for follow-up.  Diagnoses and all orders for this visit:  Generalized anxiety disorder  Insomnia due to mental condition     Alternative Viibryd with less sexual SE.  Consider triial of Wellbutrin for low libido 150 to 300 mg daily.  Continue fluoxetine for anxiety  and mood and Xanax for sleep.  FU  1 year  Lynder Parents, MD, DFAPA    Please see After Visit Summary for patient specific instructions.  No future appointments.  No orders of the defined types were placed in this encounter.   -------------------------------

## 2020-10-16 ENCOUNTER — Other Ambulatory Visit: Payer: Self-pay | Admitting: Family Medicine

## 2020-10-16 DIAGNOSIS — B0223 Postherpetic polyneuropathy: Secondary | ICD-10-CM

## 2020-10-16 MED ORDER — VALACYCLOVIR HCL 1 G PO TABS: 1000.0000 mg | ORAL_TABLET | Freq: Every day | ORAL | 3 refills | Status: DC

## 2020-10-16 MED FILL — valACYclovir HCL 1 GM TABS: 1 | 90 days supply | Qty: 90 | Fill #0

## 2020-10-28 MED FILL — ADVAIR 100/50 DISKUS: 100-50 | 30 days supply | Qty: 60 | Fill #4

## 2020-10-31 MED FILL — FLUoxetine HCL 20 MG CAPS: 20 | 90 days supply | Qty: 90 | Fill #0

## 2020-11-07 MED FILL — MONTELUKAST SOD 10 MG TAB: 10 | 90 days supply | Qty: 90 | Fill #2

## 2020-11-13 DIAGNOSIS — N400 Enlarged prostate without lower urinary tract symptoms: Secondary | ICD-10-CM | POA: Diagnosis not present

## 2020-11-13 DIAGNOSIS — Z8547 Personal history of malignant neoplasm of testis: Secondary | ICD-10-CM | POA: Diagnosis not present

## 2020-11-27 DIAGNOSIS — H01114 Allergic dermatitis of left upper eyelid: Secondary | ICD-10-CM | POA: Diagnosis not present

## 2020-12-01 ENCOUNTER — Other Ambulatory Visit (HOSPITAL_BASED_OUTPATIENT_CLINIC_OR_DEPARTMENT_OTHER): Payer: Self-pay | Admitting: Optometry

## 2020-12-01 MED FILL — NEO/POLY/DEXAMET EYE OINT: 3.5-10000-0 | 30 days supply | Qty: 4 | Fill #0

## 2020-12-23 ENCOUNTER — Other Ambulatory Visit: Payer: Self-pay

## 2020-12-23 ENCOUNTER — Telehealth: Payer: Self-pay

## 2020-12-23 MED ORDER — PANTOPRAZOLE SODIUM 20 MG PO TBEC: 20.0000 mg | DELAYED_RELEASE_TABLET | Freq: Every day | ORAL | 3 refills | Status: DC

## 2020-12-23 NOTE — Telephone Encounter (Signed)
Refills sent

## 2020-12-23 NOTE — Telephone Encounter (Signed)
Pt requesting refill on pantoprazole but would like to try a 20 MG dose. Please advise

## 2020-12-23 NOTE — Telephone Encounter (Signed)
Ok to fill #[phone] [id number] refills

## 2021-01-05 MED FILL — ADVAIR 100/50 DISKUS: 100-50 | 30 days supply | Qty: 60 | Fill #5

## 2021-01-07 ENCOUNTER — Other Ambulatory Visit: Payer: Self-pay | Admitting: Internal Medicine

## 2021-01-07 ENCOUNTER — Other Ambulatory Visit (HOSPITAL_COMMUNITY): Payer: Self-pay | Admitting: *Deleted

## 2021-01-08 LAB — CMP14+LP+CBC/D/PLT+TSH+PSA
ALT: 31 IU/L (ref 0–44)
AST: 25 IU/L (ref 0–40)
Albumin/Globulin Ratio: 1.7 (ref 1.2–2.2)
Albumin: 4.2 g/dL (ref 3.8–4.9)
Alkaline Phosphatase: 65 IU/L (ref 44–121)
BUN/Creatinine Ratio: 15 (ref 9–20)
BUN: 16 mg/dL (ref 6–24)
Basophils Absolute: 0 10*3/uL (ref 0.0–0.2)
Basos: 1 %
Bilirubin Total: 0.5 mg/dL (ref 0.0–1.2)
CO2: 25 mmol/L (ref 20–29)
Calcium: 9.1 mg/dL (ref 8.7–10.2)
Chloride: 103 mmol/L (ref 96–106)
Cholesterol, Total: 207 mg/dL — ABNORMAL HIGH (ref 100–199)
Creatinine, Ser: 1.07 mg/dL (ref 0.76–1.27)
EOS (ABSOLUTE): 0.1 10*3/uL (ref 0.0–0.4)
Eos: 2 %
Globulin, Total: 2.5 g/dL (ref 1.5–4.5)
Glucose: 96 mg/dL (ref 65–99)
HDL: 56 mg/dL (ref >39)
Hematocrit: 41.4 % (ref 37.5–51.0)
Hemoglobin: 14.3 g/dL (ref 13.0–17.7)
Immature Grans (Abs): 0 10*3/uL (ref 0.0–0.1)
Immature Granulocytes: 0 %
LDL Chol Calc (NIH): 128 mg/dL — ABNORMAL HIGH (ref 0–99)
Lymphocytes Absolute: 1.1 10*3/uL (ref 0.7–3.1)
Lymphs: 24 %
MCH: 33.5 pg — ABNORMAL HIGH (ref 26.6–33.0)
MCHC: 34.5 g/dL (ref 31.5–35.7)
MCV: 97 fL (ref 79–97)
Monocytes Absolute: 0.5 10*3/uL (ref 0.1–0.9)
Monocytes: 12 %
Neutrophils Absolute: 2.8 10*3/uL (ref 1.4–7.0)
Neutrophils: 61 %
Platelets: 186 10*3/uL (ref 150–450)
Potassium: 4.2 mmol/L (ref 3.5–5.2)
Prostate Specific Ag, Serum: 0.5 ng/mL (ref 0.0–4.0)
RBC: 4.27 x10E6/uL (ref 4.14–5.80)
RDW: 12.1 % (ref 11.6–15.4)
Sodium: 140 mmol/L (ref 134–144)
TSH: 1.59 u[IU]/mL (ref 0.450–4.500)
Total Protein: 6.7 g/dL (ref 6.0–8.5)
Triglycerides: 128 mg/dL (ref 0–149)
VLDL Cholesterol Cal: 23 mg/dL (ref 5–40)
WBC: 4.6 10*3/uL (ref 3.4–10.8)
eGFR: 81 mL/min/{1.73_m2} (ref >59)

## 2021-01-08 LAB — HGB A1C W/O EAG: Hgb A1c MFr Bld: 5.6 % (ref 4.8–5.6)

## 2021-01-13 ENCOUNTER — Other Ambulatory Visit: Payer: Self-pay

## 2021-01-13 ENCOUNTER — Ambulatory Visit (HOSPITAL_BASED_OUTPATIENT_CLINIC_OR_DEPARTMENT_OTHER)
Admission: RE | Admit: 2021-01-13 | Discharge: 2021-01-13 | Disposition: A | Discharge status: Home / Self Care | Payer: 59 | Source: Ambulatory Visit | Attending: Cardiology | Admitting: Cardiology

## 2021-01-15 DIAGNOSIS — G4733 Obstructive sleep apnea (adult) (pediatric): Secondary | ICD-10-CM | POA: Diagnosis not present

## 2021-01-26 ENCOUNTER — Other Ambulatory Visit: Payer: Self-pay | Admitting: Family Medicine

## 2021-01-26 ENCOUNTER — Encounter: Payer: Self-pay | Admitting: Family Medicine

## 2021-01-26 MED FILL — Pantoprazole Sodium EC Tab 20 MG (Base Equiv): ORAL | 90 days supply | Qty: 90 | Fill #0 | Status: CN

## 2021-01-26 MED FILL — Fluoxetine HCl Cap 20 MG: ORAL | 90 days supply | Qty: 90 | Fill #0 | Status: AC

## 2021-01-27 ENCOUNTER — Other Ambulatory Visit (HOSPITAL_BASED_OUTPATIENT_CLINIC_OR_DEPARTMENT_OTHER): Payer: Self-pay

## 2021-01-27 ENCOUNTER — Telehealth: Payer: Self-pay

## 2021-01-27 MED ORDER — ALOCRIL 2 % OP SOLN
1.0000 [drp] | Freq: Three times a day (TID) | OPHTHALMIC | 5 refills | Status: DC | PRN
  Filled 2021-01-27: qty 5, 17d supply, fill #0

## 2021-01-27 MED ORDER — FLUTICASONE-SALMETEROL 100-50 MCG/DOSE IN AEPB
1.0000 | INHALATION_SPRAY | Freq: Two times a day (BID) | RESPIRATORY_TRACT | 5 refills | Status: DC
  Filled 2021-01-27 – 2021-01-29 (×2): qty 60, 30d supply, fill #0

## 2021-01-27 MED ORDER — EPINEPHRINE 0.3 MG/0.3ML IJ SOAJ
0.3000 mg | INTRAMUSCULAR | 3 refills | Status: AC | PRN
  Filled 2021-01-27: qty 2, 2d supply, fill #0

## 2021-01-27 NOTE — Telephone Encounter (Signed)
PA approved. Effective 01/27/21 to 01/26/2022 for 12 fills.

## 2021-01-27 NOTE — Telephone Encounter (Signed)
PA initiated via Covermymeds; KEY:  [id number]. Awaiting determination.

## 2021-01-28 ENCOUNTER — Other Ambulatory Visit: Payer: Self-pay | Admitting: Family Medicine

## 2021-01-28 ENCOUNTER — Other Ambulatory Visit (HOSPITAL_BASED_OUTPATIENT_CLINIC_OR_DEPARTMENT_OTHER): Payer: Self-pay

## 2021-01-28 DIAGNOSIS — Z91018 Allergy to other foods: Secondary | ICD-10-CM

## 2021-01-28 MED ORDER — PREDNISONE 20 MG PO TABS
ORAL_TABLET | ORAL | 0 refills | Status: DC
  Filled 2021-01-28: qty 30, 30d supply, fill #0

## 2021-01-29 ENCOUNTER — Other Ambulatory Visit (HOSPITAL_BASED_OUTPATIENT_CLINIC_OR_DEPARTMENT_OTHER): Payer: Self-pay

## 2021-01-29 ENCOUNTER — Other Ambulatory Visit: Payer: Self-pay | Admitting: Psychiatry

## 2021-01-29 MED ORDER — ALPRAZOLAM 0.5 MG PO TABS
0.5000 mg | ORAL_TABLET | Freq: Three times a day (TID) | ORAL | 1 refills | Status: DC | PRN
  Filled 2021-01-29: qty 270, 90d supply, fill #0
  Filled 2021-06-06: qty 270, 90d supply, fill #1

## 2021-01-30 ENCOUNTER — Other Ambulatory Visit (HOSPITAL_BASED_OUTPATIENT_CLINIC_OR_DEPARTMENT_OTHER): Payer: Self-pay

## 2021-02-02 ENCOUNTER — Ambulatory Visit: Payer: 59 | Attending: Internal Medicine

## 2021-02-02 ENCOUNTER — Other Ambulatory Visit (HOSPITAL_BASED_OUTPATIENT_CLINIC_OR_DEPARTMENT_OTHER): Payer: Self-pay

## 2021-02-02 DIAGNOSIS — Z23 Encounter for immunization: Secondary | ICD-10-CM

## 2021-02-02 MED ORDER — PFIZER-BIONT COVID-19 VAC-TRIS 30 MCG/0.3ML IM SUSP
INTRAMUSCULAR | 0 refills | Status: DC
  Filled 2021-02-02: qty 0.3, 1d supply, fill #0

## 2021-02-02 NOTE — Progress Notes (Signed)
   Covid-19 Vaccination Clinic  Name:  Daniel Lam    MRN: [id number] DOB: Jun 27, 1961  02/02/2021  [name] was observed post Covid-19 immunization for 15 minutes without incident. He was provided with Vaccine Information Sheet and instruction to access the V-Safe system.   [name] was instructed to call 911 with any severe reactions post vaccine: Marland Kitchen Difficulty breathing  . Swelling of face and throat  . A fast heartbeat  . A bad rash all over body  . Dizziness and weakness   Immunizations Administered    Name Date Dose VIS Date Route   PFIZER Comrnaty(Gray TOP) Covid-19 Vaccine 02/02/2021 12:25 PM 0.3 mL 09/18/2020 Intramuscular   Manufacturer: [employer]   Lot: [id number]   NDC: [phone]

## 2021-02-09 ENCOUNTER — Other Ambulatory Visit: Payer: Self-pay

## 2021-02-09 ENCOUNTER — Other Ambulatory Visit (HOSPITAL_BASED_OUTPATIENT_CLINIC_OR_DEPARTMENT_OTHER): Payer: Self-pay

## 2021-02-09 MED ORDER — VENTOLIN HFA 108 (90 BASE) MCG/ACT IN AERS
INHALATION_SPRAY | RESPIRATORY_TRACT | 11 refills | Status: DC
  Filled 2021-02-09: qty 18, 25d supply, fill #0

## 2021-02-09 MED ORDER — ALBUTEROL SULFATE HFA 108 (90 BASE) MCG/ACT IN AERS
2.0000 | INHALATION_SPRAY | Freq: Four times a day (QID) | RESPIRATORY_TRACT | 12 refills | Status: DC | PRN
  Filled 2021-02-09: qty 18, 25d supply, fill #0
  Filled 2021-08-17: qty 18, 25d supply, fill #1
  Filled 2021-11-16: qty 18, 25d supply, fill #2

## 2021-02-18 ENCOUNTER — Other Ambulatory Visit (HOSPITAL_BASED_OUTPATIENT_CLINIC_OR_DEPARTMENT_OTHER): Payer: Self-pay

## 2021-03-23 ENCOUNTER — Other Ambulatory Visit (HOSPITAL_BASED_OUTPATIENT_CLINIC_OR_DEPARTMENT_OTHER): Payer: Self-pay

## 2021-03-23 MED FILL — Pantoprazole Sodium EC Tab 20 MG (Base Equiv): ORAL | 90 days supply | Qty: 90 | Fill #0 | Status: AC

## 2021-04-10 ENCOUNTER — Other Ambulatory Visit (HOSPITAL_BASED_OUTPATIENT_CLINIC_OR_DEPARTMENT_OTHER): Payer: Self-pay

## 2021-04-10 MED FILL — Valacyclovir HCl Tab 1 GM: ORAL | 90 days supply | Qty: 90 | Fill #0 | Status: AC

## 2021-04-25 MED FILL — Fluoxetine HCl Cap 20 MG: ORAL | 90 days supply | Qty: 90 | Fill #1 | Status: AC

## 2021-04-27 ENCOUNTER — Other Ambulatory Visit (HOSPITAL_BASED_OUTPATIENT_CLINIC_OR_DEPARTMENT_OTHER): Payer: Self-pay

## 2021-05-07 ENCOUNTER — Other Ambulatory Visit (HOSPITAL_BASED_OUTPATIENT_CLINIC_OR_DEPARTMENT_OTHER): Payer: Self-pay

## 2021-05-07 DIAGNOSIS — D225 Melanocytic nevi of trunk: Secondary | ICD-10-CM | POA: Diagnosis not present

## 2021-05-07 DIAGNOSIS — L218 Other seborrheic dermatitis: Secondary | ICD-10-CM | POA: Diagnosis not present

## 2021-05-07 DIAGNOSIS — L905 Scar conditions and fibrosis of skin: Secondary | ICD-10-CM | POA: Diagnosis not present

## 2021-05-07 DIAGNOSIS — D1801 Hemangioma of skin and subcutaneous tissue: Secondary | ICD-10-CM | POA: Diagnosis not present

## 2021-05-07 DIAGNOSIS — L814 Other melanin hyperpigmentation: Secondary | ICD-10-CM | POA: Diagnosis not present

## 2021-05-07 DIAGNOSIS — Z872 Personal history of diseases of the skin and subcutaneous tissue: Secondary | ICD-10-CM | POA: Diagnosis not present

## 2021-05-07 MED ORDER — KETOCONAZOLE 2 % EX SHAM
MEDICATED_SHAMPOO | CUTANEOUS | 2 refills | Status: DC
  Filled 2021-05-07: qty 120, 30d supply, fill #0

## 2021-05-12 ENCOUNTER — Other Ambulatory Visit (HOSPITAL_BASED_OUTPATIENT_CLINIC_OR_DEPARTMENT_OTHER): Payer: Self-pay

## 2021-05-14 ENCOUNTER — Other Ambulatory Visit: Payer: Self-pay

## 2021-05-14 ENCOUNTER — Other Ambulatory Visit (HOSPITAL_BASED_OUTPATIENT_CLINIC_OR_DEPARTMENT_OTHER): Payer: Self-pay

## 2021-05-14 MED ORDER — FLUTICASONE-SALMETEROL 100-50 MCG/ACT IN AEPB
1.0000 | INHALATION_SPRAY | Freq: Two times a day (BID) | RESPIRATORY_TRACT | 3 refills | Status: DC
  Filled 2021-05-14: qty 60, 30d supply, fill #0
  Filled 2021-07-12: qty 60, 30d supply, fill #1
  Filled 2021-09-20: qty 60, 30d supply, fill #2
  Filled 2021-11-16: qty 60, 30d supply, fill #3

## 2021-06-08 ENCOUNTER — Other Ambulatory Visit (HOSPITAL_BASED_OUTPATIENT_CLINIC_OR_DEPARTMENT_OTHER): Payer: Self-pay

## 2021-06-17 ENCOUNTER — Other Ambulatory Visit (HOSPITAL_BASED_OUTPATIENT_CLINIC_OR_DEPARTMENT_OTHER): Payer: Self-pay

## 2021-06-17 MED FILL — Pantoprazole Sodium EC Tab 20 MG (Base Equiv): ORAL | 90 days supply | Qty: 90 | Fill #1 | Status: AC

## 2021-07-13 ENCOUNTER — Other Ambulatory Visit (HOSPITAL_BASED_OUTPATIENT_CLINIC_OR_DEPARTMENT_OTHER): Payer: Self-pay

## 2021-07-14 ENCOUNTER — Other Ambulatory Visit (HOSPITAL_BASED_OUTPATIENT_CLINIC_OR_DEPARTMENT_OTHER): Payer: Self-pay

## 2021-08-05 ENCOUNTER — Other Ambulatory Visit (HOSPITAL_BASED_OUTPATIENT_CLINIC_OR_DEPARTMENT_OTHER): Payer: Self-pay

## 2021-08-05 MED FILL — Fluoxetine HCl Cap 20 MG: ORAL | 90 days supply | Qty: 90 | Fill #2 | Status: AC

## 2021-08-12 DIAGNOSIS — Z91018 Allergy to other foods: Secondary | ICD-10-CM | POA: Diagnosis not present

## 2021-08-12 DIAGNOSIS — Z91013 Allergy to seafood: Secondary | ICD-10-CM | POA: Diagnosis not present

## 2021-08-12 DIAGNOSIS — L309 Dermatitis, unspecified: Secondary | ICD-10-CM | POA: Diagnosis not present

## 2021-08-12 DIAGNOSIS — J4520 Mild intermittent asthma, uncomplicated: Secondary | ICD-10-CM | POA: Diagnosis not present

## 2021-08-17 ENCOUNTER — Other Ambulatory Visit (HOSPITAL_BASED_OUTPATIENT_CLINIC_OR_DEPARTMENT_OTHER): Payer: Self-pay

## 2021-09-14 ENCOUNTER — Other Ambulatory Visit: Payer: Self-pay

## 2021-09-14 ENCOUNTER — Other Ambulatory Visit (HOSPITAL_BASED_OUTPATIENT_CLINIC_OR_DEPARTMENT_OTHER): Payer: Self-pay

## 2021-09-14 MED ORDER — PANTOPRAZOLE SODIUM 40 MG PO TBEC
40.0000 mg | DELAYED_RELEASE_TABLET | Freq: Every day | ORAL | 3 refills | Status: DC
  Filled 2021-09-14: qty 90, 90d supply, fill #0
  Filled 2021-12-15: qty 90, 90d supply, fill #1
  Filled 2022-03-10: qty 90, 90d supply, fill #2

## 2021-09-21 ENCOUNTER — Other Ambulatory Visit (HOSPITAL_BASED_OUTPATIENT_CLINIC_OR_DEPARTMENT_OTHER): Payer: Self-pay

## 2021-10-08 ENCOUNTER — Ambulatory Visit (INDEPENDENT_AMBULATORY_CARE_PROVIDER_SITE_OTHER): Payer: 59 | Admitting: Psychiatry

## 2021-10-08 ENCOUNTER — Other Ambulatory Visit: Payer: Self-pay

## 2021-10-08 ENCOUNTER — Encounter: Payer: Self-pay | Admitting: Psychiatry

## 2021-10-08 ENCOUNTER — Other Ambulatory Visit (HOSPITAL_BASED_OUTPATIENT_CLINIC_OR_DEPARTMENT_OTHER): Payer: Self-pay

## 2021-10-08 DIAGNOSIS — F411 Generalized anxiety disorder: Secondary | ICD-10-CM | POA: Diagnosis not present

## 2021-10-08 DIAGNOSIS — F5105 Insomnia due to other mental disorder: Secondary | ICD-10-CM | POA: Diagnosis not present

## 2021-10-08 MED ORDER — ALPRAZOLAM 0.5 MG PO TABS
0.5000 mg | ORAL_TABLET | Freq: Three times a day (TID) | ORAL | 1 refills | Status: DC | PRN
  Filled 2021-10-08: qty 270, 90d supply, fill #0

## 2021-10-08 MED ORDER — FLUOXETINE HCL 20 MG PO CAPS
ORAL_CAPSULE | Freq: Every day | ORAL | 3 refills | Status: DC
Start: 2021-10-08 — End: 2022-09-23
  Filled 2021-10-08: qty 90, fill #0
  Filled 2021-10-27: qty 90, 90d supply, fill #0
  Filled 2022-01-27: qty 90, 90d supply, fill #1
  Filled 2022-04-25: qty 90, 90d supply, fill #2
  Filled 2022-07-24: qty 17, 17d supply, fill #3
  Filled 2022-07-26: qty 73, 73d supply, fill #3

## 2021-10-08 NOTE — Progress Notes (Signed)
Daniel Lam [id number] 11/16/1960 60 y.o.  Subjective:   Patient ID:  Daniel Lam is a 60 y.o. (DOB 12-13-1960) male.  Chief Complaint:  Chief Complaint  Patient presents with   Follow-up   Anxiety    HPI Daniel Lam presents to the office today for follow-up of GAD.  Last seen a year ago without med changes..  10/08/21 appt noted: Challenges this year 41 yo son declared himself transgender.  Reacted the best I can. Never really tried Wellbutrin bc he didn't want to bother with more pills.  He might consider it.   OK with depression and anxiety.  One of the things that helped was more acceptance.  Happy being a doctor.    No sig changes.  Increased xanax a little over time.  Needed more Xanax to sleep 0.75 mg HS.  Sleep OK with that.  Anxiety is manageable.  Mood and otherwise are the same. Relational stress. Doesn't think meds will fix this.  Use CPAP.   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  Not able to do that.  Past Psychiatric Medication Trials: Trintellix SE,  Fluoxetine, sertraline, citalopram, paroxetine, venlafaxine constipation,  Xanax  Review of Systems:  Review of Systems  Cardiovascular:  Negative for chest pain and palpitations.  Neurological:  Negative for tremors and weakness.   Medications: I have reviewed the patient's current medications.  Current Outpatient Medications  Medication Sig Dispense Refill   acetaminophen (TYLENOL) 500 MG tablet Take 500 mg by mouth every 6 (six) hours as needed.     albuterol (VENTOLIN HFA) 108 (90 Base) MCG/ACT inhaler Inhale 2 puffs into the lungs every 6 (six) hours as needed for wheezing or shortness of breath. 18 g 12   cholecalciferol (VITAMIN D3) 25 MCG (1000 UT) tablet Take 1,000 Units by mouth daily.     COVID-19 mRNA Vac-TriS, Pfizer, (PFIZER-BIONT COVID-19 VAC-TRIS) SUSP injection Inject into the muscle. 0.3 mL 0   cromolyn (OPTICROM) 4 % ophthalmic solution Place 2 drops into both eyes 4 (four) times daily. (Patient taking  differently: Place 2 drops into both eyes 4 (four) times daily as needed.) 10 mL 12   EPINEPHrine (EPIPEN 2-PAK) 0.3 mg/0.3 mL IJ SOAJ injection Inject 0.3 mg into the muscle as needed for anaphylaxis. 2 each 3   fluticasone (CUTIVATE) 0.005 % ointment APPLY 1 APPLICATION TOPICALLY 2 TIMES DAILY. (Patient taking differently: 2 (two) times daily as needed.) 30 g 3   Fluticasone-Salmeterol (ADVAIR DISKUS) 100-50 MCG/DOSE AEPB Inhale 1 puff by mouth into the lungs in the morning and at bedtime. 60 each 5   fluticasone-salmeterol (ADVAIR) 100-50 MCG/ACT AEPB Inhale 1 puff into the lungs 2 (two) times daily. 60 each 3   ibuprofen (ADVIL,MOTRIN) 200 MG tablet Take 200 mg by mouth every 6 (six) hours as needed.     ketoconazole (NIZORAL) 2 % cream Apply 1 application topically 2 (two) times daily. 60 g 1   ketoconazole (NIZORAL) 2 % shampoo Use to wash the affected area every other day as matienance 120 mL 2   lidocaine (LINDAMANTLE) 3 % CREA cream APPLY 1 APPLICATION TOPICALLY AS NEEDED. 85 g 3   lidocaine (XYLOCAINE) 5 % ointment Apply 1 application topically as needed. 30 g 0   nedocromil (ALOCRIL) 2 % ophthalmic solution Place 1 drop into both eyes 3 (three) times daily as needed. 5 mL 5   neomycin-polymyxin b-dexamethasone (MAXITROL) 3.5-10000-0.1 OINT APPLY A 1 INCH RIBBON 2 TIMES DAILY TO THE AFFECTED LIDS FOR  2 WEEKS THEN APPLY ONCE DAILY FOR 2 WEEKS 3.5 g 3   NONFORMULARY OR COMPOUNDED ITEM RESMED Standard Air Tubing for CPAP 1 each 11   NONFORMULARY OR COMPOUNDED ITEM ResMed Swift FX nasal pillows mask (Medium) 1 each 11   pantoprazole (PROTONIX) 40 MG tablet Take 1 tablet (40 mg total) by mouth daily. 90 tablet 3   pseudoephedrine (SUDAFED) 60 MG tablet Take 1 tablet (60 mg total) by mouth every 4 (four) hours as needed for congestion. 30 tablet 0   valACYclovir (VALTREX) 1000 MG tablet TAKE 1 TABLET BY MOUTH ONCE DAILY 90 tablet 3   ALPRAZolam (XANAX) 0.5 MG tablet Take 1 tablet (0.5 mg total)  by mouth 3 (three) times daily as needed. 270 tablet 1   buPROPion (WELLBUTRIN XL) 150 MG 24 hr tablet 1 in the morning for a week, then 2 each morning (Patient not taking: Reported on 10/08/2021) 30 tablet 0   FLUoxetine (PROZAC) 20 MG capsule TAKE 1 CAPSULE BY MOUTH ONCE DAILY 90 capsule 3   montelukast (SINGULAIR) 10 MG tablet TAKE 1 TABLET (10 MG TOTAL) BY MOUTH AT BEDTIME. 90 tablet 3   prednisoLONE acetate (PRED FORTE) 1 % ophthalmic suspension Place 2 drops into both eyes 4 (four) times daily. (Patient not taking: Reported on 10/08/2021) 5 mL 0   predniSONE (DELTASONE) 10 MG tablet TAKE 4 TABLETS PO QD FOR 3 DAYS THEN TAKE 3 TABLETS PO QD FOR 3 DAYS THEN TAKE 2 TABLET PO QD FOR 3 DAYS THEN TAKE 11 TAB PO QD FOR 3 DAYS then 1/2 po qd for 3 days (Patient not taking: Reported on 10/08/2021) 35 tablet 0   predniSONE (DELTASONE) 20 MG tablet take as directed by mouth for allergic reaction (Patient not taking: Reported on 10/08/2021) 30 tablet 0   No current facility-administered medications for this visit.    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   Numbness and tingling    Other and unspecified anterior pituitary hyperfunction    Sleep apnea    Unspecified asthma(493.90)    controlled    Family History  Problem Relation Age of Onset   Sjogren's syndrome Mother    COPD Mother    Colon polyps Father    Dementia Father     Throat cancer Father    Melanoma Brother    Colon cancer Neg Hx    Esophageal cancer Neg Hx    Stomach cancer Neg Hx    Rectal cancer Neg Hx    Liver disease Neg Hx     Social History   Socioeconomic History   Marital status: Married    Spouse name: Not on file   Number of children: 3   Years of education: MD   Highest education level: Not on file  Occupational  History   Occupation: physician    Comment: internal medicine  Tobacco Use   Smoking status: Some Days    Packs/day: 0.25    Types: Cigarettes, Cigars   Smokeless tobacco: Never   Tobacco comments:    Occasional cigar  Vaping Use   Vaping Use: Never used  Substance and Sexual Activity   Alcohol use: Yes    Alcohol/week: 0.0 standard drinks    Comment: occ   Drug use: No   Sexual activity: Yes    Partners: Female  Other Topics Concern   Not on file  Social History Narrative   Regular exercise: yes, very active   Caffeine use: 2 cups of coffee and soda's daily.   Right-handed.   Social Determinants of Health   Financial Resource Strain: Not on file  Food Insecurity: Not on file  Transportation Needs: Not on file  Physical Activity: Not on file  Stress: Not on file  Social Connections: Not on file  Intimate Partner Violence: Not on file    Past Medical History, Surgical history, Social history, and Family history were reviewed and updated as appropriate.   Please see review of systems for further details on the patient's review from today.   Objective:   Physical Exam:  There were no vitals taken for this visit.  Physical Exam Constitutional:      General: He is not in acute distress.    Appearance: He is well-developed.  Musculoskeletal:        General: No deformity.  Neurological:     Mental Status: He is alert and oriented to person, place, and time.     Coordination: Coordination normal.  Psychiatric:        Attention and Perception: Attention and perception normal. He does not  perceive auditory or visual hallucinations.        Mood and Affect: Mood normal. Mood is not anxious or depressed. Affect is not labile, blunt, angry or inappropriate.        Speech: Speech normal.        Behavior: Behavior normal. Behavior is not slowed.        Thought Content: Thought content normal. Thought content does not include homicidal or suicidal ideation. Thought content does not include suicidal plan.        Cognition and Memory: Cognition and memory normal.        Judgment: Judgment normal.     Comments: Insight intact. No delusions.     Lab Review:     Component Value Date/Time   NA 140 01/07/2021 0740   K 4.2 01/07/2021 0740   CL 103 01/07/2021 0740   CO2 25 01/07/2021 0740   GLUCOSE 96 01/07/2021 0740   GLUCOSE 96 07/16/2019 1228   BUN 16 01/07/2021 0740   CREATININE 1.07 01/07/2021 0740   CREATININE 1.01 07/16/2019 1228   CALCIUM 9.1 01/07/2021 0740   PROT 6.7 01/07/2021 0740   ALBUMIN 4.2 01/07/2021 0740   AST 25 01/07/2021 0740   ALT 31 01/07/2021 0740   ALKPHOS 65 01/07/2021 0740   BILITOT 0.5 01/07/2021 0740   GFRNONAA 83 07/16/2019 1228   GFRAA 96 07/16/2019 1228       Component Value Date/Time   WBC 4.6 01/07/2021 0740   WBC 4.8 07/16/2019 1228   RBC 4.27 01/07/2021 0740   RBC 4.33 07/16/2019 1228   HGB 14.3 01/07/2021 0740   HCT 41.4 01/07/2021 0740   PLT 186 01/07/2021 0740   MCV 97 01/07/2021 0740  MCH 33.5 (H) 01/07/2021 0740   MCH 32.1 07/16/2019 1228   MCHC 34.5 01/07/2021 0740   MCHC 33.8 07/16/2019 1228   RDW 12.1 01/07/2021 0740   LYMPHSABS 1.1 01/07/2021 0740   MONOABS 0.3 08/17/2016 0740   EOSABS 0.1 01/07/2021 0740   BASOSABS 0.0 01/07/2021 0740    No results found for: POCLITH, LITHIUM   No results found for: PHENYTOIN, PHENOBARB, VALPROATE, CBMZ   .res Assessment: Plan:    Daniel Lam was seen today for follow-up and anxiety.  Diagnoses and all orders for this visit:  Generalized anxiety disorder -     FLUoxetine  (PROZAC) 20 MG capsule; TAKE 1 CAPSULE BY MOUTH ONCE DAILY  Insomnia due to mental condition -     ALPRAZolam (XANAX) 0.5 MG tablet; Take 1 tablet (0.5 mg total) by mouth 3 (three) times daily as needed.   Disc option of counseling again.  Supportive therapy dealing with family stressors. Not depressed but disenchanted.  Alternative Viibryd with less sexual SE.  Consider triial of Wellbutrin for low libido 150 to 300 mg daily.  Continue fluoxetine 20 mg daily for anxiety and mood and Xanax 0.75 mg for sleep. No Xanax daytime.  FU  1 year  Lynder Parents, MD, DFAPA    Please see After Visit Summary for patient specific instructions.  No future appointments.   No orders of the defined types were placed in this encounter.   -------------------------------

## 2021-10-09 ENCOUNTER — Other Ambulatory Visit (HOSPITAL_BASED_OUTPATIENT_CLINIC_OR_DEPARTMENT_OTHER): Payer: Self-pay

## 2021-10-09 MED FILL — Valacyclovir HCl Tab 1 GM: ORAL | 90 days supply | Qty: 90 | Fill #1 | Status: AC

## 2021-10-27 ENCOUNTER — Other Ambulatory Visit: Payer: Self-pay | Admitting: Family Medicine

## 2021-10-27 ENCOUNTER — Other Ambulatory Visit (HOSPITAL_BASED_OUTPATIENT_CLINIC_OR_DEPARTMENT_OTHER): Payer: Self-pay

## 2021-10-27 MED ORDER — MONTELUKAST SODIUM 10 MG PO TABS
ORAL_TABLET | Freq: Every day | ORAL | 3 refills | Status: DC
  Filled 2021-10-27: qty 90, 90d supply, fill #0

## 2021-10-29 ENCOUNTER — Other Ambulatory Visit (HOSPITAL_BASED_OUTPATIENT_CLINIC_OR_DEPARTMENT_OTHER): Payer: Self-pay

## 2021-11-16 ENCOUNTER — Other Ambulatory Visit (HOSPITAL_BASED_OUTPATIENT_CLINIC_OR_DEPARTMENT_OTHER): Payer: Self-pay

## 2021-11-23 ENCOUNTER — Other Ambulatory Visit (HOSPITAL_BASED_OUTPATIENT_CLINIC_OR_DEPARTMENT_OTHER): Payer: Self-pay

## 2021-11-23 ENCOUNTER — Other Ambulatory Visit: Payer: Self-pay | Admitting: Family Medicine

## 2021-11-23 MED ORDER — BUDESONIDE-FORMOTEROL FUMARATE 80-4.5 MCG/ACT IN AERO
2.0000 | INHALATION_SPRAY | Freq: Two times a day (BID) | RESPIRATORY_TRACT | 3 refills | Status: DC
  Filled 2021-11-23: qty 10.2, 30d supply, fill #0

## 2021-11-24 ENCOUNTER — Other Ambulatory Visit (HOSPITAL_BASED_OUTPATIENT_CLINIC_OR_DEPARTMENT_OTHER): Payer: Self-pay

## 2021-11-24 ENCOUNTER — Other Ambulatory Visit: Payer: Self-pay | Admitting: Family Medicine

## 2021-11-25 ENCOUNTER — Other Ambulatory Visit: Payer: Self-pay | Admitting: Family Medicine

## 2021-11-25 ENCOUNTER — Other Ambulatory Visit (HOSPITAL_BASED_OUTPATIENT_CLINIC_OR_DEPARTMENT_OTHER): Payer: Self-pay

## 2021-11-25 MED ORDER — FLUTICASONE-SALMETEROL 100-50 MCG/ACT IN AEPB
1.0000 | INHALATION_SPRAY | Freq: Two times a day (BID) | RESPIRATORY_TRACT | 3 refills | Status: DC
  Filled 2021-11-25: qty 60, 30d supply, fill #0
  Filled 2022-01-27: qty 60, 30d supply, fill #1
  Filled 2022-04-01: qty 60, 30d supply, fill #2

## 2021-12-03 DIAGNOSIS — H02834 Dermatochalasis of left upper eyelid: Secondary | ICD-10-CM | POA: Diagnosis not present

## 2021-12-03 DIAGNOSIS — H02831 Dermatochalasis of right upper eyelid: Secondary | ICD-10-CM | POA: Diagnosis not present

## 2021-12-15 ENCOUNTER — Other Ambulatory Visit (HOSPITAL_BASED_OUTPATIENT_CLINIC_OR_DEPARTMENT_OTHER): Payer: Self-pay

## 2021-12-25 DIAGNOSIS — G4733 Obstructive sleep apnea (adult) (pediatric): Secondary | ICD-10-CM | POA: Diagnosis not present

## 2021-12-28 ENCOUNTER — Telehealth: Payer: Self-pay

## 2021-12-28 NOTE — Telephone Encounter (Signed)
Pt requesting jury duty exemption for 03/02/22 this time. Okay to give letter per Dr. Etter Sjogren.  ?

## 2021-12-29 ENCOUNTER — Encounter: Payer: Self-pay | Admitting: Family Medicine

## 2022-01-21 ENCOUNTER — Other Ambulatory Visit (HOSPITAL_BASED_OUTPATIENT_CLINIC_OR_DEPARTMENT_OTHER): Payer: Self-pay

## 2022-01-27 ENCOUNTER — Other Ambulatory Visit (HOSPITAL_BASED_OUTPATIENT_CLINIC_OR_DEPARTMENT_OTHER): Payer: Self-pay

## 2022-01-28 ENCOUNTER — Other Ambulatory Visit (HOSPITAL_BASED_OUTPATIENT_CLINIC_OR_DEPARTMENT_OTHER): Payer: Self-pay

## 2022-01-29 ENCOUNTER — Other Ambulatory Visit (HOSPITAL_BASED_OUTPATIENT_CLINIC_OR_DEPARTMENT_OTHER): Payer: Self-pay

## 2022-02-26 ENCOUNTER — Other Ambulatory Visit (HOSPITAL_BASED_OUTPATIENT_CLINIC_OR_DEPARTMENT_OTHER): Payer: Self-pay

## 2022-02-26 ENCOUNTER — Other Ambulatory Visit: Payer: Self-pay | Admitting: Family Medicine

## 2022-02-26 DIAGNOSIS — U071 COVID-19: Secondary | ICD-10-CM

## 2022-02-26 MED ORDER — MOLNUPIRAVIR EUA 200MG CAPSULE
4.0000 | ORAL_CAPSULE | Freq: Two times a day (BID) | ORAL | 0 refills | Status: AC
  Filled 2022-02-26: qty 40, 5d supply, fill #0

## 2022-03-10 ENCOUNTER — Other Ambulatory Visit (HOSPITAL_BASED_OUTPATIENT_CLINIC_OR_DEPARTMENT_OTHER): Payer: Self-pay

## 2022-04-01 ENCOUNTER — Other Ambulatory Visit (HOSPITAL_BASED_OUTPATIENT_CLINIC_OR_DEPARTMENT_OTHER): Payer: Self-pay

## 2022-04-02 ENCOUNTER — Other Ambulatory Visit (HOSPITAL_BASED_OUTPATIENT_CLINIC_OR_DEPARTMENT_OTHER): Payer: Self-pay

## 2022-04-15 ENCOUNTER — Other Ambulatory Visit (HOSPITAL_BASED_OUTPATIENT_CLINIC_OR_DEPARTMENT_OTHER): Payer: Self-pay

## 2022-04-15 ENCOUNTER — Other Ambulatory Visit: Payer: Self-pay

## 2022-04-15 ENCOUNTER — Other Ambulatory Visit: Payer: Self-pay | Admitting: Family Medicine

## 2022-04-15 DIAGNOSIS — B0223 Postherpetic polyneuropathy: Secondary | ICD-10-CM

## 2022-04-15 MED ORDER — VALACYCLOVIR HCL 1 G PO TABS
ORAL_TABLET | Freq: Every day | ORAL | 3 refills | Status: DC
  Filled 2022-04-15: qty 90, 90d supply, fill #0
  Filled 2022-10-16: qty 90, 90d supply, fill #1
  Filled 2023-04-01: qty 90, 90d supply, fill #2

## 2022-04-26 ENCOUNTER — Other Ambulatory Visit (HOSPITAL_BASED_OUTPATIENT_CLINIC_OR_DEPARTMENT_OTHER): Payer: Self-pay

## 2022-05-03 ENCOUNTER — Other Ambulatory Visit: Payer: Self-pay

## 2022-05-03 ENCOUNTER — Other Ambulatory Visit (HOSPITAL_BASED_OUTPATIENT_CLINIC_OR_DEPARTMENT_OTHER): Payer: Self-pay

## 2022-05-03 MED ORDER — ALBUTEROL SULFATE HFA 108 (90 BASE) MCG/ACT IN AERS
2.0000 | INHALATION_SPRAY | Freq: Four times a day (QID) | RESPIRATORY_TRACT | 12 refills | Status: DC | PRN
  Filled 2022-05-03: qty 6.7, 25d supply, fill #0

## 2022-05-06 DIAGNOSIS — D485 Neoplasm of uncertain behavior of skin: Secondary | ICD-10-CM | POA: Diagnosis not present

## 2022-05-06 DIAGNOSIS — D225 Melanocytic nevi of trunk: Secondary | ICD-10-CM | POA: Diagnosis not present

## 2022-05-06 DIAGNOSIS — L814 Other melanin hyperpigmentation: Secondary | ICD-10-CM | POA: Diagnosis not present

## 2022-05-06 DIAGNOSIS — L218 Other seborrheic dermatitis: Secondary | ICD-10-CM | POA: Diagnosis not present

## 2022-05-06 DIAGNOSIS — B353 Tinea pedis: Secondary | ICD-10-CM | POA: Diagnosis not present

## 2022-05-06 DIAGNOSIS — L821 Other seborrheic keratosis: Secondary | ICD-10-CM | POA: Diagnosis not present

## 2022-05-06 DIAGNOSIS — R238 Other skin changes: Secondary | ICD-10-CM | POA: Diagnosis not present

## 2022-05-06 DIAGNOSIS — Z872 Personal history of diseases of the skin and subcutaneous tissue: Secondary | ICD-10-CM | POA: Diagnosis not present

## 2022-05-06 DIAGNOSIS — B078 Other viral warts: Secondary | ICD-10-CM | POA: Diagnosis not present

## 2022-05-07 ENCOUNTER — Other Ambulatory Visit (HOSPITAL_BASED_OUTPATIENT_CLINIC_OR_DEPARTMENT_OTHER): Payer: Self-pay

## 2022-05-07 MED ORDER — NAFTIFINE HCL 2 % EX GEL
CUTANEOUS | 1 refills | Status: DC
  Filled 2022-05-07: qty 60, 21d supply, fill #0

## 2022-05-10 ENCOUNTER — Other Ambulatory Visit (HOSPITAL_BASED_OUTPATIENT_CLINIC_OR_DEPARTMENT_OTHER): Payer: Self-pay

## 2022-05-21 ENCOUNTER — Other Ambulatory Visit: Payer: Self-pay

## 2022-05-21 ENCOUNTER — Other Ambulatory Visit (HOSPITAL_BASED_OUTPATIENT_CLINIC_OR_DEPARTMENT_OTHER): Payer: Self-pay

## 2022-05-21 MED ORDER — PANTOPRAZOLE SODIUM 40 MG PO TBEC
40.0000 mg | DELAYED_RELEASE_TABLET | Freq: Every day | ORAL | 3 refills | Status: DC
  Filled 2022-05-21 – 2022-05-24 (×2): qty 90, 90d supply, fill #0
  Filled 2022-09-07: qty 90, 90d supply, fill #1
  Filled 2022-11-18 – 2022-11-22 (×2): qty 90, 90d supply, fill #2
  Filled 2022-12-20 – 2023-03-20 (×2): qty 90, 90d supply, fill #3

## 2022-05-21 MED ORDER — FLUTICASONE-SALMETEROL 100-50 MCG/ACT IN AEPB
1.0000 | INHALATION_SPRAY | Freq: Two times a day (BID) | RESPIRATORY_TRACT | 3 refills | Status: DC
  Filled 2022-05-21: qty 60, 30d supply, fill #0
  Filled 2022-08-20: qty 60, 30d supply, fill #1
  Filled 2022-11-22: qty 60, 30d supply, fill #2

## 2022-05-24 ENCOUNTER — Other Ambulatory Visit (HOSPITAL_BASED_OUTPATIENT_CLINIC_OR_DEPARTMENT_OTHER): Payer: Self-pay

## 2022-07-26 ENCOUNTER — Other Ambulatory Visit (HOSPITAL_BASED_OUTPATIENT_CLINIC_OR_DEPARTMENT_OTHER): Payer: Self-pay

## 2022-08-02 DIAGNOSIS — H5711 Ocular pain, right eye: Secondary | ICD-10-CM | POA: Diagnosis not present

## 2022-08-03 ENCOUNTER — Other Ambulatory Visit (HOSPITAL_BASED_OUTPATIENT_CLINIC_OR_DEPARTMENT_OTHER): Payer: Self-pay

## 2022-08-03 MED ORDER — PREDNISOLONE ACETATE 1 % OP SUSP
1.0000 [drp] | Freq: Four times a day (QID) | OPHTHALMIC | 0 refills | Status: DC
  Filled 2022-08-03: qty 5, 14d supply, fill #0

## 2022-08-04 ENCOUNTER — Other Ambulatory Visit (HOSPITAL_BASED_OUTPATIENT_CLINIC_OR_DEPARTMENT_OTHER): Payer: Self-pay

## 2022-08-16 DIAGNOSIS — H5711 Ocular pain, right eye: Secondary | ICD-10-CM | POA: Diagnosis not present

## 2022-08-17 ENCOUNTER — Other Ambulatory Visit: Payer: Self-pay | Admitting: Family Medicine

## 2022-08-17 DIAGNOSIS — H5713 Ocular pain, bilateral: Secondary | ICD-10-CM

## 2022-08-20 ENCOUNTER — Other Ambulatory Visit: Payer: Self-pay | Admitting: Psychiatry

## 2022-08-20 ENCOUNTER — Other Ambulatory Visit (HOSPITAL_BASED_OUTPATIENT_CLINIC_OR_DEPARTMENT_OTHER): Payer: Self-pay

## 2022-08-20 DIAGNOSIS — F5105 Insomnia due to other mental disorder: Secondary | ICD-10-CM

## 2022-08-20 MED ORDER — ALPRAZOLAM 0.5 MG PO TABS
0.5000 mg | ORAL_TABLET | Freq: Three times a day (TID) | ORAL | 0 refills | Status: DC | PRN
  Filled 2022-08-20: qty 270, 90d supply, fill #0

## 2022-08-23 ENCOUNTER — Other Ambulatory Visit (HOSPITAL_BASED_OUTPATIENT_CLINIC_OR_DEPARTMENT_OTHER): Payer: Self-pay

## 2022-08-25 ENCOUNTER — Ambulatory Visit (HOSPITAL_BASED_OUTPATIENT_CLINIC_OR_DEPARTMENT_OTHER)
Admission: RE | Admit: 2022-08-25 | Discharge: 2022-08-25 | Disposition: A | Discharge status: Home / Self Care | Payer: 59 | Source: Ambulatory Visit | Attending: Family Medicine | Admitting: Family Medicine

## 2022-08-25 DIAGNOSIS — H5711 Ocular pain, right eye: Secondary | ICD-10-CM | POA: Diagnosis not present

## 2022-08-25 DIAGNOSIS — H5713 Ocular pain, bilateral: Secondary | ICD-10-CM | POA: Diagnosis not present

## 2022-08-25 MED ORDER — IOHEXOL 300 MG/ML  SOLN
100.0000 mL | Freq: Once | INTRAMUSCULAR | Status: AC | PRN
  Administered 2022-08-25: 75 mL via INTRAVENOUS

## 2022-08-27 ENCOUNTER — Telehealth: Payer: Self-pay | Admitting: *Deleted

## 2022-08-27 ENCOUNTER — Other Ambulatory Visit: Payer: Self-pay | Admitting: Family Medicine

## 2022-08-27 ENCOUNTER — Other Ambulatory Visit (INDEPENDENT_AMBULATORY_CARE_PROVIDER_SITE_OTHER): Payer: 59

## 2022-08-27 DIAGNOSIS — H5713 Ocular pain, bilateral: Secondary | ICD-10-CM

## 2022-08-27 LAB — SEDIMENTATION RATE: Sed Rate: 4 mm/hr (ref 0–20)

## 2022-08-27 NOTE — Telephone Encounter (Signed)
Dr. Felecia Shelling asked for me to call pt to schedule appt for Monday. I called pt and scheduled for 08/30/22 at 3p, check in 230pm. Pt verbalized understanding and appreciation.

## 2022-08-29 NOTE — Progress Notes (Unsigned)
[facility]  PATIENT: Daniel Branch, MD DOB: Jan 04, 1961  REFERRING DOCTOR OR PCP: Roma Schanz, MD SOURCE: Patient, notes from primary care, imaging and lab results, CT and MRI images personally reviewed.  _________________________________   HISTORICAL  CHIEF COMPLAINT:  Chief Complaint  Patient presents with   Follow-up    Pt in room #1 and alone. Pt here today for f/u on abnormal MRI.    HISTORY OF PRESENT ILLNESS:  I had the pleasure of seeing your patient, Dr. Belinda Fisher, at [facility] for neurologic consultation regarding his eye pain and abnormal CT scan.  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    He did not note visual change or other symptoms.   Due to persisting symptoms, he saw ophthalmology last week.  Vision was fine.  No diplopia.  No visual field defect.     An orbital CT was ordered.   It showed enhancement of the right optic nerve at the apex of the orbit with probable mild enlargement.  The distal optic nerve appeared normal...     He has not had similar pain in the past.    He still has some eye pain but the eye pain is a little better than it was initially.  Pain was never severe.   He denies visual acuity issues.   Color vision is symmetric.  Several years ago, he saw Dr. Krista Blue for painful dysesthesias in both legs.   He continues to have some paresthesias in his feet.  MRI of the lumbar spine 08/27/2019 showed a disc herniation to the right at L5-S1 displacing the right S1 nerve root.  He has mild spinal stenosis and facet hypertrophy at L3-L4 and L4-L5.   MRI of the cervical spine 09/10/2016 showed mild multilevel degenerative changes.  The spinal cord was normal.  He had a palpable lymph node in 2021 and had a CT scan of the chest.  It did not show mediastinal lymphadenopathy though it did show a mildly enlarged left supraclavicular  lymph node and a stable nodule in the right upper lobe felt to be prior granulomatous disease.  There is also calcified mediastinal lymph nodes felt to be prior granulomatous disease.   He is from [place] and had TB exposure though TB test was normal in the past.     He had a tick bite in the past.   He has alpha-Gal syndrome.    The western blot for Lyme did not show active Lyme.      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  Imaging: CT scan of the orbits/face 08/25/2022 showed abnormal enhancement of the posterior intraorbital right optic nerve.  CT chest 09/29/2020 showed Small 8 mm short axis lymph node in the left supraclavicular region.   Patient was then utilized for COVID-19 in the left deltoid on 09/08/2020 and this could contribute to this adenopathy. Short-term clinical follow-up is recommended. The node is not significant by size criteria.   Stable nodule in the right upper lobe from 20/10 likely related to  prior granulomatous disease   Calcified mediastinal lymph nodes consistent with prior granulomatous disease.  MRI of the lumbar spine 08/27/2019 showed a disc herniation to the right at L5-S1 displacing the right S1 nerve root.  He  has mild spinal stenosis and facet hypertrophy at L3-L4 and L4-L5.     MRI of the cervical spine 09/10/2016 showed mild multilevel degenerative changes.  The spinal cord was normal.  REVIEW OF SYSTEMS: Constitutional: No fevers, chills, sweats, or change in appetite Eyes: No visual changes, double vision, eye pain Ear, nose and throat: No hearing loss, ear pain, nasal congestion, sore throat Cardiovascular: No chest pain, palpitations Respiratory:  No shortness of breath at rest or with exertion.   No wheezes GastrointestinaI: No nausea, vomiting, diarrhea, abdominal pain, fecal incontinence Genitourinary:  No dysuria, urinary retention or frequency.  No  nocturia. Musculoskeletal:  No neck pain, back pain Integumentary: No rash, pruritus, skin lesions Neurological: as above Psychiatric: No depression at this time.  No anxiety Endocrine: No palpitations, diaphoresis, change in appetite, change in weigh or increased thirst Hematologic/Lymphatic:  No anemia, purpura, petechiae. Allergic/Immunologic: No itchy/runny eyes, nasal congestion, recent allergic reactions, rashes  ALLERGIES: Allergies  Allergen Reactions   Demerol [Meperidine Hcl]     Difficulty voiding   Food     Mammals- Alpha Gal Syndrome   Latex     REACTION: SOB, rash, itching   Morphine And Related     Difficulty voiding    HOME MEDICATIONS:  Current Outpatient Medications:    acetaminophen (TYLENOL) 500 MG tablet, Take 500 mg by mouth every 6 (six) hours as needed., Disp: , Rfl:    albuterol (VENTOLIN HFA) 108 (90 Base) MCG/ACT inhaler, Inhale 2 puffs into the lungs every 6 (six) hours as needed for wheezing or shortness of breath., Disp: 6.7 g, Rfl: 12   ALPRAZolam (XANAX) 0.5 MG tablet, Take 1 tablet (0.5 mg total) by mouth 3 (three) times daily as needed., Disp: 270 tablet, Rfl: 0   cholecalciferol (VITAMIN D3) 25 MCG (1000 UT) tablet, Take 1,000 Units by mouth daily., Disp: , Rfl:    cromolyn (OPTICROM) 4 % ophthalmic solution, Place 2 drops into both eyes 4 (four) times daily. (Patient taking differently: Place 2 drops into both eyes 4 (four) times daily as needed.), Disp: 10 mL, Rfl: 12   EPINEPHrine (EPIPEN 2-PAK) 0.3 mg/0.3 mL IJ SOAJ injection, Inject 0.3 mg into the muscle as needed for anaphylaxis., Disp: 2 each, Rfl: 3   FLUoxetine (PROZAC) 20 MG capsule, TAKE 1 CAPSULE BY MOUTH ONCE DAILY, Disp: 90 capsule, Rfl: 3   fluticasone (CUTIVATE) 0.005 % ointment, APPLY 1 APPLICATION TOPICALLY 2 TIMES DAILY. (Patient taking differently: 2 (two) times daily as needed.), Disp: 30 g, Rfl: 3   fluticasone-salmeterol (ADVAIR) 100-50 MCG/ACT AEPB, Inhale 1 puff by mouth  into the lungs 2 (two) times daily., Disp: 60 each, Rfl: 3   ibuprofen (ADVIL,MOTRIN) 200 MG tablet, Take 200 mg by mouth every 6 (six) hours as needed., Disp: , Rfl:    ketoconazole (NIZORAL) 2 % cream, Apply 1 application topically 2 (two) times daily., Disp: 60 g, Rfl: 1   ketoconazole (NIZORAL) 2 % shampoo, Use to wash the affected area every other day as matienance, Disp: 120 mL, Rfl: 2   lidocaine (LINDAMANTLE) 3 % CREA cream, APPLY 1 APPLICATION TOPICALLY AS NEEDED., Disp: 85 g, Rfl: 3   montelukast (SINGULAIR) 10 MG tablet, TAKE 1 TABLET (10 MG TOTAL) BY MOUTH AT BEDTIME., Disp: 90 tablet, Rfl: 3   NONFORMULARY OR COMPOUNDED ITEM, RESMED Standard Air Tubing for CPAP, Disp: 1 each, Rfl: 11   NONFORMULARY OR COMPOUNDED ITEM, ResMed Swift FX nasal pillows mask (Medium), Disp: 1 each, Rfl: 11  pantoprazole (PROTONIX) 40 MG tablet, Take 1 tablet (40 mg total) by mouth daily., Disp: 90 tablet, Rfl: 3   pseudoephedrine (SUDAFED) 60 MG tablet, Take 1 tablet (60 mg total) by mouth every 4 (four) hours as needed for congestion., Disp: 30 tablet, Rfl: 0   valACYclovir (VALTREX) 1000 MG tablet, TAKE 1 TABLET BY MOUTH ONCE DAILY, Disp: 90 tablet, Rfl: 3   COVID-19 mRNA Vac-TriS, Pfizer, (PFIZER-BIONT COVID-19 VAC-TRIS) SUSP injection, Inject into the muscle. (Patient not taking: Reported on 08/30/2022), Disp: 0.3 mL, Rfl: 0   lidocaine (XYLOCAINE) 5 % ointment, Apply 1 application topically as needed. (Patient not taking: Reported on 08/30/2022), Disp: 30 g, Rfl: 0   Naftifine HCl (NAFTIN) 2 % GEL, Apply to feet twice a week for 2-3 weeks (Patient not taking: Reported on 08/30/2022), Disp: 60 g, Rfl: 1   nedocromil (ALOCRIL) 2 % ophthalmic solution, Place 1 drop into both eyes 3 (three) times daily as needed. (Patient not taking: Reported on 08/30/2022), Disp: 5 mL, Rfl: 5   prednisoLONE acetate (PRED FORTE) 1 % ophthalmic suspension, Place 1 drop into the right eye 4 (four) times daily for 1 week, and  then 1 drop into the right eye 2 (two) times daily for the second week. 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 08/2009    FAMILY HISTORY: Family History  Problem Relation Age of Onset   Sjogren's syndrome Mother    COPD Mother    Daniel polyps Father    Dementia Father    Throat cancer Father    Melanoma Brother    Daniel cancer Neg Hx    Esophageal cancer Neg Hx    Stomach cancer Neg Hx    Rectal cancer Neg Hx    Liver disease Neg Hx     SOCIAL HISTORY: Social History   Socioeconomic History   Marital status: Married    Spouse name: Not on file   Number of children: 3   Years of education: MD   Highest education level: Not on file  Occupational History   Occupation: physician    Comment: internal medicine  Tobacco Use   Smoking status: Some Days    Packs/day: 0.25    Types: Cigarettes, Cigars   Smokeless tobacco: Never   Tobacco comments:    Occasional cigar  Vaping Use   Vaping Use: Never used  Substance and Sexual Activity   Alcohol use: Yes    Alcohol/week:  0.0 standard drinks of  alcohol    Comment: occ   Drug use: No   Sexual activity: Yes    Partners: Female  Other Topics Concern   Not on file  Social History Narrative   Regular exercise: yes, very active   Caffeine use: 2 cups of coffee and soda's daily.   Right-handed.   Social Determinants of Health   Financial Resource Strain: Not on file  Food Insecurity: Not on file  Transportation Needs: Not on file  Physical Activity: Not on file  Stress: Not on file  Social Connections: Not on file  Intimate Partner Violence: Not on file       PHYSICAL EXAM  Vitals:   08/30/22 1443  BP: 119/78  Pulse: 73  Weight: 224 lb (101.6 kg)  Height: '5\' 10"'$  (1.778 m)    Body mass index is 32.14 kg/m.   General: The patient is well-developed and well-nourished and in no acute distress  HEENT:  Head is [place]/AT.  Sclera are anicteric.  Funduscopic exam shows normal optic discs and retinal vessels.  Neck: No carotid bruits are noted.  The neck is nontender.  Cardiovascular: The heart has a regular rate and rhythm with a normal S1 and S2. There were no murmurs, gallops or rubs.    Skin: Extremities are without rash or  edema.  Musculoskeletal:  Back is nontender  Neurologic Exam  Mental status: The patient is alert and oriented x 3 at the time of the examination. The patient has apparent normal recent and remote memory, with an apparently normal attention span and concentration ability.   Speech is normal.  Cranial nerves: Extraocular movements are full. Pupils are equal, round, and reactive to light and accomodation.  Visual fields are full.  Facial symmetry is present. There is good facial sensation to soft touch bilaterally.Facial strength is normal.  Trapezius and sternocleidomastoid strength is normal. No dysarthria is noted.  The tongue is midline, and the patient has symmetric elevation of the soft palate. No obvious hearing deficits are noted.  Motor:  Muscle bulk is normal.    Tone is normal. Strength is  5 / 5 in all 4 extremities.   Sensory: Sensory testing is intact to pinprick, soft touch and vibration sensation in all 4 extremities.  Coordination: Cerebellar testing reveals good finger-nose-finger and heel-to-shin bilaterally.  Gait and station: Station is normal.   Gait is normal. Tandem gait is normal. Romberg is negative.   Reflexes: Deep tendon reflexes are symmetric and normal bilaterally.   Plantar responses are flexor.    DIAGNOSTIC DATA (LABS, IMAGING, TESTING) - I reviewed patient records, labs, notes, testing and imaging myself where available.  Lab Results  Component Value Date   WBC 4.6 01/07/2021   HGB 14.3 01/07/2021   HCT 41.4 01/07/2021   MCV 97 01/07/2021   PLT 186 01/07/2021      Component Value Date/Time   NA 140 01/07/2021 0740   K 4.2 01/07/2021 0740   CL 103 01/07/2021 0740   CO2 25 01/07/2021 0740   GLUCOSE 96 01/07/2021 0740   GLUCOSE 96 07/16/2019 1228   BUN 16 01/07/2021 0740   CREATININE 1.07 01/07/2021 0740   CREATININE 1.01 07/16/2019 1228   CALCIUM 9.1 01/07/2021 0740   PROT 6.7 01/07/2021 0740   ALBUMIN 4.2 01/07/2021 0740   AST 25 01/07/2021 0740   ALT 31 01/07/2021 0740   ALKPHOS 65 01/07/2021 0740   BILITOT 0.5 01/07/2021 0740   GFRNONAA 83 07/16/2019 1228   GFRAA 96  07/16/2019 1228   Lab Results  Component Value Date   CHOL 207 (H) 01/07/2021   HDL 56 01/07/2021   LDLCALC 128 (H) 01/07/2021   LDLDIRECT 119.0 11/27/2015   TRIG 128 01/07/2021   CHOLHDL 2.8 07/16/2019   Lab Results  Component Value Date   HGBA1C 5.6 01/07/2021   Lab Results  Component Value Date   VITAMINB12 348 08/22/2018   Lab Results  Component Value Date   TSH 1.590 01/07/2021       ASSESSMENT AND PLAN  Orbital pain, right - Plan: MR ORBITS W WO CONTRAST, MR BRAIN W WO CONTRAST  Orbital mass - Plan: MR ORBITS W WO CONTRAST  Numbness - Plan: MR BRAIN W WO CONTRAST   In summary, 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  The onset of the pain was acute and has been fairly chronic with some fluctuations.  The pain may be mildly better now than it was last month.  Due to continued symptoms, he had a CT scan of the orbits which showed enhancement of or around the prechiasmatic right optic nerve near the orbital apex.  I spoke with him last week and we ordered sed rate and angiotensin-converting enzyme, both normal.  Other lab work including anti-MOG, anti-NMO are still pending.  He has a history of TB exposure though has been TB negative in the past.  We will check the QuantiFERON TB test.  I have ordered an MRI of the brain and orbits.  Based on the results additional testing may be needed.  His physical examination was normal today.  After the MRI, he will start a Medrol Dosepak to see if that helps to relieve the pain.  Other treatment or testing may be necessary after the MRI.  I will see him back as needed based on the results of the studies and if he has new or worsening symptoms.  We will speak to him after the MRI.  Thank you for asking me to see Dr. Larose Kells.  Please let me know if I can be of further assistance with him or other patients in the future.     Quinteria Chisum A. Felecia Shelling, MD, [facility] 24/46/2863, 8:17 PM Certified in Neurology, Clinical Neurophysiology, Sleep Medicine and Neuroimaging  [facility] [address], [place], [place] [address] [phone]

## 2022-08-30 ENCOUNTER — Other Ambulatory Visit (HOSPITAL_BASED_OUTPATIENT_CLINIC_OR_DEPARTMENT_OTHER): Payer: Self-pay

## 2022-08-30 ENCOUNTER — Ambulatory Visit: Payer: 59 | Admitting: Neurology

## 2022-08-30 ENCOUNTER — Telehealth: Payer: Self-pay | Admitting: Neurology

## 2022-08-30 ENCOUNTER — Encounter: Payer: Self-pay | Admitting: Neurology

## 2022-08-30 VITALS — BP 119/78 | HR 73 | Ht 70.0 in | Wt 224.0 lb

## 2022-08-30 DIAGNOSIS — R200 Anesthesia of skin: Secondary | ICD-10-CM

## 2022-08-30 DIAGNOSIS — H5711 Ocular pain, right eye: Secondary | ICD-10-CM | POA: Diagnosis not present

## 2022-08-30 DIAGNOSIS — H0589 Other disorders of orbit: Secondary | ICD-10-CM | POA: Diagnosis not present

## 2022-08-30 LAB — NEUROMYELITIS OPTICA AUTOAB, IGG: NMO IgG Autoantibodies: <1.5 U/mL (ref 0.0–3.0)

## 2022-08-30 LAB — ANGIOTENSIN CONVERTING ENZYME: Angiotensin-Converting Enzyme: 20 U/L (ref 9–67)

## 2022-08-30 MED ORDER — METHYLPREDNISOLONE 4 MG PO TBPK
ORAL_TABLET | ORAL | 0 refills | Status: AC
  Filled 2022-08-30: qty 21, 6d supply, fill #0

## 2022-08-30 NOTE — Telephone Encounter (Signed)
Pt scheduled for 90 mins MR ORBITS W WO CONTRAST and MR BRAIN W WO CONTRAST at [facility] on 08/31/22 at 8:30am  NPR

## 2022-08-31 ENCOUNTER — Ambulatory Visit: Payer: 59

## 2022-08-31 ENCOUNTER — Telehealth: Payer: Self-pay | Admitting: Neurology

## 2022-08-31 DIAGNOSIS — H5711 Ocular pain, right eye: Secondary | ICD-10-CM | POA: Diagnosis not present

## 2022-08-31 DIAGNOSIS — H0589 Other disorders of orbit: Secondary | ICD-10-CM

## 2022-08-31 DIAGNOSIS — R200 Anesthesia of skin: Secondary | ICD-10-CM

## 2022-08-31 LAB — ANA W/REFLEX: ANA Titer 1: NEGATIVE

## 2022-08-31 LAB — ANTI-MOG, SERUM: MOG Antibody, Cell-based IFA: NEGATIVE

## 2022-08-31 MED ORDER — GADOBENATE DIMEGLUMINE 529 MG/ML IV SOLN
20.0000 mL | Freq: Once | INTRAVENOUS | Status: AC | PRN
  Administered 2022-08-31: 20 mL via INTRAVENOUS

## 2022-08-31 NOTE — Telephone Encounter (Signed)
The MRI of the brain and the orbits look normal.  Specifically the optic nerve near the apex of the orbit had a normal appearance and normal enhancement pattern.  I spoke to Dr. Larose Kells about the results.  I have prescribed a steroid pack and he could see if that helps some of the orbital pain.

## 2022-09-07 ENCOUNTER — Other Ambulatory Visit (HOSPITAL_BASED_OUTPATIENT_CLINIC_OR_DEPARTMENT_OTHER): Payer: Self-pay

## 2022-09-23 ENCOUNTER — Other Ambulatory Visit (HOSPITAL_BASED_OUTPATIENT_CLINIC_OR_DEPARTMENT_OTHER): Payer: Self-pay

## 2022-09-23 ENCOUNTER — Encounter: Payer: Self-pay | Admitting: Psychiatry

## 2022-09-23 ENCOUNTER — Ambulatory Visit (INDEPENDENT_AMBULATORY_CARE_PROVIDER_SITE_OTHER): Payer: 59 | Admitting: Psychiatry

## 2022-09-23 DIAGNOSIS — F411 Generalized anxiety disorder: Secondary | ICD-10-CM

## 2022-09-23 DIAGNOSIS — F5105 Insomnia due to other mental disorder: Secondary | ICD-10-CM

## 2022-09-23 MED ORDER — FLUOXETINE HCL 20 MG PO CAPS
20.0000 mg | ORAL_CAPSULE | Freq: Every day | ORAL | 3 refills | Status: DC
  Filled 2022-09-23 – 2022-10-16 (×2): qty 90, 90d supply, fill #0
  Filled 2023-01-11: qty 90, 90d supply, fill #1

## 2022-09-23 NOTE — Progress Notes (Signed)
Daniel Lam, [place] [id number] 09-27-1961 61 y.o.  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  Last seen 61 years ago without med changes..  10/08/21 appt noted: Challenges this year 61 yo son declared himself transgender.  Reacted the best I can. Never really tried Wellbutrin bc he didn't want to bother with more pills.  He might consider it.   OK with depression and anxiety.  One of the things that helped was more acceptance.  Happy being a doctor.   No sig changes.  Increased xanax a little over time.  Needed more Xanax to sleep 0.75 mg HS.  Sleep OK with that.  Anxiety is manageable.  Mood and otherwise are the same. Relational stress. Doesn't think meds will fix this.  Use CPAP.   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  Not able to do that.  09/23/22  appt noted: Good overall.  Going regularly to see [facility] therapist.  Has been very helpful.  Journaling.  Has been helpful.  Liberating in a way. Overall things are good.  Will slow down in practice and transition over the next 5 years and work less and do things more.   Has felt ok with meds so hasn't changed anything. Plans to cut hours next year.   Past Psychiatric Medication Trials: Trintellix SE,  Fluoxetine, sertraline, citalopram, paroxetine, venlafaxine constipation,  Xanax  Review of Systems:  Review of Systems  Cardiovascular:  Negative for chest pain and palpitations.  Neurological:  Negative for tremors.  Psychiatric/Behavioral:  Negative for dysphoric mood.     Medications: I have reviewed the patient's current medications.  Current Outpatient Medications  Medication Sig Dispense Refill   acetaminophen (TYLENOL) 500 MG tablet Take 500 mg by mouth every 6 (six) hours as needed.     albuterol (VENTOLIN HFA) 108 (90 Base) MCG/ACT  inhaler Inhale 2 puffs into the lungs every 6 (six) hours as needed for wheezing or shortness of breath. 6.7 g 12   ALPRAZolam (XANAX) 0.5 MG tablet Take 1 tablet (0.5 mg total) by mouth 3 (three) times daily as needed. 270 tablet 0   cholecalciferol (VITAMIN D3) 25 MCG (1000 UT) tablet Take 1,000 Units by mouth daily.     COVID-19 mRNA Vac-TriS, Pfizer, (PFIZER-BIONT COVID-19 VAC-TRIS) SUSP injection Inject into the muscle. 0.3 mL 0   cromolyn (OPTICROM) 4 % ophthalmic solution Place 2 drops into both eyes 4 (four) times daily. (Patient taking differently: Place 2 drops into both eyes 4 (four) times daily as needed.) 10 mL 12   EPINEPHrine (EPIPEN 2-PAK) 0.3 mg/0.3 mL IJ SOAJ injection Inject 0.3 mg into the muscle as needed for anaphylaxis. 2 each 3   fluticasone (CUTIVATE) 0.005 % ointment APPLY 1 APPLICATION TOPICALLY 2 TIMES DAILY. (Patient taking differently: 2 (two) times daily as needed.) 30 g 3   fluticasone-salmeterol (ADVAIR) 100-50 MCG/ACT AEPB Inhale 1 puff by mouth into the lungs 2 (two) times daily. 60 each 3   ibuprofen (ADVIL,MOTRIN) 200 MG tablet Take 200 mg by mouth every 6 (six) hours as needed.     ketoconazole (NIZORAL) 2 % cream Apply 1 application topically 2 (two) times daily. 60 g 1   ketoconazole (NIZORAL) 2 % shampoo Use to wash the affected area every  other day as matienance 120 mL 2   lidocaine (LINDAMANTLE) 3 % CREA cream APPLY 1 APPLICATION TOPICALLY AS NEEDED. 85 g 3   montelukast (SINGULAIR) 10 MG tablet TAKE 1 TABLET (10 MG TOTAL) BY MOUTH AT BEDTIME. 90 tablet 3   NONFORMULARY OR COMPOUNDED ITEM RESMED Standard Air Tubing for CPAP 1 each 11   NONFORMULARY OR COMPOUNDED ITEM ResMed Swift FX nasal pillows mask (Medium) 1 each 11   pantoprazole (PROTONIX) 40 MG tablet Take 1 tablet (40 mg total) by mouth daily. 90 tablet 3   pseudoephedrine (SUDAFED) 60 MG tablet Take 1 tablet (60 mg total) by mouth every 4 (four) hours as needed for congestion. 30 tablet 0    valACYclovir (VALTREX) 1000 MG tablet TAKE 1 TABLET BY MOUTH ONCE DAILY 90 tablet 3   FLUoxetine (PROZAC) 20 MG capsule TAKE 1 CAPSULE BY MOUTH ONCE DAILY 90 capsule 3   lidocaine (XYLOCAINE) 5 % ointment Apply 1 application topically as needed. (Patient not taking: Reported on 08/30/2022) 30 g 0   Naftifine HCl (NAFTIN) 2 % GEL Apply to feet twice a week for 2-3 weeks (Patient not taking: Reported on 08/30/2022) 60 g 1   nedocromil (ALOCRIL) 2 % ophthalmic solution Place 1 drop into both eyes 3 (three) times daily as needed. (Patient not taking: Reported on 08/30/2022) 5 mL 5   No current facility-administered medications for this visit.    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   Numbness and tingling    Other and unspecified anterior pituitary hyperfunction    Sleep apnea    Unspecified asthma(493.90)    controlled    Family History  Problem Relation Age of Onset   Sjogren's syndrome Mother    COPD Mother    Daniel polyps Father    Dementia Father    Throat cancer Father    Melanoma Brother    Daniel cancer Neg Hx    Esophageal cancer Neg Hx    Stomach cancer Neg Hx    Rectal cancer Neg Hx    Liver disease Neg Hx     Social History   Socioeconomic History   Marital status: Married    Spouse name: Not on file   Number of children: 3   Years of education: MD   Highest education level: Not on file   Occupational History   Occupation: physician    Comment: internal medicine  Tobacco Use   Smoking status: Some Days    Packs/day: 0.25    Types: Cigarettes, Cigars   Smokeless tobacco: Never   Tobacco comments:    Occasional cigar  Vaping Use   Vaping Use: Never used  Substance and Sexual Activity   Alcohol use: Yes    Alcohol/week: 0.0 standard drinks of alcohol  Comment: occ   Drug use: No   Sexual activity: Yes    Partners: Female  Other Topics Concern   Not on file  Social History Narrative   Regular exercise: yes, very active   Caffeine use: 2 cups of coffee and soda's daily.   Right-handed.   Social Determinants of Health   Financial Resource Strain: Not on file  Food Insecurity: Not on file  Transportation Needs: Not on file  Physical Activity: Not on file  Stress: Not on file  Social Connections: Not on file  Intimate Partner Violence: Not on file    Past Medical History, Surgical history, Social history, and Family history were reviewed and updated as appropriate.   Please see review of systems for further details on the patient's review from today.   Objective:   Physical Exam:  There were no vitals taken for this visit.  Physical Exam Constitutional:      General: He is not in acute distress.    Appearance: He is well-developed.  Musculoskeletal:        General: No deformity.  Neurological:     Mental Status: He is alert and oriented to person, place, and time.     Coordination: Coordination normal.  Psychiatric:        Attention and Perception: Attention and perception normal. He does not perceive auditory or visual hallucinations.        Mood and Affect: Mood normal. Mood is not anxious or depressed. Affect is not labile, blunt or angry.        Speech: Speech normal.        Behavior: Behavior normal. Behavior is not slowed.        Thought Content: Thought content normal. Thought content is not delusional. Thought content does not include  homicidal or suicidal ideation. Thought content does not include suicidal plan.        Cognition and Memory: Cognition and memory normal.        Judgment: Judgment normal.     Comments: Insight intact. No delusions.      Lab Review:     Component Value Date/Time   NA 140 01/07/2021 0740   K 4.2 01/07/2021 0740   CL 103 01/07/2021 0740   CO2 25 01/07/2021 0740   GLUCOSE 96 01/07/2021 0740   GLUCOSE 96 07/16/2019 1228   BUN 16 01/07/2021 0740   CREATININE 1.07 01/07/2021 0740   CREATININE 1.01 07/16/2019 1228   CALCIUM 9.1 01/07/2021 0740   PROT 6.7 01/07/2021 0740   ALBUMIN 4.2 01/07/2021 0740   AST 25 01/07/2021 0740   ALT 31 01/07/2021 0740   ALKPHOS 65 01/07/2021 0740   BILITOT 0.5 01/07/2021 0740   GFRNONAA 83 07/16/2019 1228   GFRAA 96 07/16/2019 1228       Component Value Date/Time   WBC 4.6 01/07/2021 0740   WBC 4.8 07/16/2019 1228   RBC 4.27 01/07/2021 0740   RBC 4.33 07/16/2019 1228   HGB 14.3 01/07/2021 0740   HCT 41.4 01/07/2021 0740   PLT 186 01/07/2021 0740   MCV 97 01/07/2021 0740   MCH 33.5 (H) 01/07/2021 0740   MCH 32.1 07/16/2019 1228   MCHC 34.5 01/07/2021 0740   MCHC 33.8 07/16/2019 1228   RDW 12.1 01/07/2021 0740   LYMPHSABS 1.1 01/07/2021 0740   MONOABS 0.3 08/17/2016 0740   EOSABS 0.1 01/07/2021 0740   BASOSABS 0.0 01/07/2021 0740    No results found for: "POCLITH", "LITHIUM"   No results found  for: "PHENYTOIN", "PHENOBARB", "VALPROATE", "CBMZ"   .res Assessment: Plan:    Sione was seen today for follow-up and anxiety.  Diagnoses and all orders for this visit:  Generalized anxiety disorder -     FLUoxetine (PROZAC) 20 MG capsule; TAKE 1 CAPSULE BY MOUTH ONCE DAILY  Insomnia due to mental condition   Disc option of counseling again.  Supportive therapy dealing with family stressors. Not depressed but disenchanted.  Alternative Viibryd with less sexual SE or Auvelity  Wants to consider switching to Buspar and try weaning  fluoxetine.    Continue fluoxetine 20 mg daily until spring  for anxiety and mood and Xanax 0.75 mg for sleep. No Xanax daytime.  FU  1 year  Lynder Parents, MD, DFAPA    Please see After Visit Summary for patient specific instructions.  No future appointments.   No orders of the defined types were placed in this encounter.   -------------------------------

## 2022-10-18 ENCOUNTER — Other Ambulatory Visit (HOSPITAL_BASED_OUTPATIENT_CLINIC_OR_DEPARTMENT_OTHER): Payer: Self-pay

## 2022-10-18 ENCOUNTER — Other Ambulatory Visit: Payer: Self-pay

## 2022-11-09 ENCOUNTER — Other Ambulatory Visit: Payer: Self-pay | Admitting: Family Medicine

## 2022-11-09 DIAGNOSIS — Z0000 Encounter for general adult medical examination without abnormal findings: Secondary | ICD-10-CM

## 2022-11-09 DIAGNOSIS — Z91018 Allergy to other foods: Secondary | ICD-10-CM

## 2022-11-09 DIAGNOSIS — E538 Deficiency of other specified B group vitamins: Secondary | ICD-10-CM

## 2022-11-09 DIAGNOSIS — E559 Vitamin D deficiency, unspecified: Secondary | ICD-10-CM

## 2022-11-12 ENCOUNTER — Telehealth: Payer: Self-pay | Admitting: Family

## 2022-11-12 ENCOUNTER — Other Ambulatory Visit: Payer: Self-pay | Admitting: Family Medicine

## 2022-11-12 ENCOUNTER — Other Ambulatory Visit: Payer: Self-pay

## 2022-11-12 ENCOUNTER — Other Ambulatory Visit (HOSPITAL_BASED_OUTPATIENT_CLINIC_OR_DEPARTMENT_OTHER): Payer: Self-pay

## 2022-11-12 DIAGNOSIS — Z91018 Allergy to other foods: Secondary | ICD-10-CM

## 2022-11-12 MED ORDER — AZITHROMYCIN 250 MG PO TABS
ORAL_TABLET | ORAL | 0 refills | Status: AC
  Filled 2022-11-12: qty 6, 5d supply, fill #0

## 2022-11-12 MED ORDER — MONTELUKAST SODIUM 10 MG PO TABS
10.0000 mg | ORAL_TABLET | Freq: Every day | ORAL | 3 refills | Status: DC
  Filled 2022-11-12 (×2): qty 90, 90d supply, fill #0

## 2022-11-12 NOTE — Addendum Note (Signed)
Addended by: Sanda Linger on: 11/12/2022 10:33 AM   Modules accepted: Orders

## 2022-11-12 NOTE — Telephone Encounter (Signed)
Pt reports 1 month hx of cough/bronchitis symptoms. Rx sent for zpak to downstairs pharmacy.

## 2022-11-15 ENCOUNTER — Other Ambulatory Visit (INDEPENDENT_AMBULATORY_CARE_PROVIDER_SITE_OTHER): Payer: Commercial Managed Care - PPO

## 2022-11-15 DIAGNOSIS — E538 Deficiency of other specified B group vitamins: Secondary | ICD-10-CM

## 2022-11-15 DIAGNOSIS — Z125 Encounter for screening for malignant neoplasm of prostate: Secondary | ICD-10-CM | POA: Diagnosis not present

## 2022-11-15 DIAGNOSIS — Z91018 Allergy to other foods: Secondary | ICD-10-CM

## 2022-11-15 DIAGNOSIS — E559 Vitamin D deficiency, unspecified: Secondary | ICD-10-CM

## 2022-11-15 DIAGNOSIS — Z0000 Encounter for general adult medical examination without abnormal findings: Secondary | ICD-10-CM

## 2022-11-15 DIAGNOSIS — R82998 Other abnormal findings in urine: Secondary | ICD-10-CM

## 2022-11-15 LAB — COMPREHENSIVE METABOLIC PANEL
ALT: 31 U/L (ref 0–53)
AST: 22 U/L (ref 0–37)
Albumin: 4.5 g/dL (ref 3.5–5.2)
Alkaline Phosphatase: 61 U/L (ref 39–117)
BUN: 18 mg/dL (ref 6–23)
CO2: 30 mEq/L (ref 19–32)
Calcium: 9.5 mg/dL (ref 8.4–10.5)
Chloride: 102 mEq/L (ref 96–112)
Creatinine, Ser: 1.09 mg/dL (ref 0.40–1.50)
GFR: 74.14 mL/min (ref >60.00)
Glucose, Bld: 102 mg/dL — ABNORMAL HIGH (ref 70–99)
Potassium: 4.3 mEq/L (ref 3.5–5.1)
Sodium: 139 mEq/L (ref 135–145)
Total Bilirubin: 0.5 mg/dL (ref 0.2–1.2)
Total Protein: 6.9 g/dL (ref 6.0–8.3)

## 2022-11-15 LAB — FOLATE: Folate: 13.7 ng/mL (ref >5.9)

## 2022-11-15 LAB — CBC WITH DIFFERENTIAL/PLATELET
Basophils Absolute: 0 10*3/uL (ref 0.0–0.1)
Basophils Relative: 0.7 % (ref 0.0–3.0)
Eosinophils Absolute: 0.1 10*3/uL (ref 0.0–0.7)
Eosinophils Relative: 1.8 % (ref 0.0–5.0)
HCT: 41.1 % (ref 39.0–52.0)
Hemoglobin: 14.2 g/dL (ref 13.0–17.0)
Lymphocytes Relative: 23.5 % (ref 12.0–46.0)
Lymphs Abs: 1.3 10*3/uL (ref 0.7–4.0)
MCHC: 34.5 g/dL (ref 30.0–36.0)
MCV: 96.2 fl (ref 78.0–100.0)
Monocytes Absolute: 0.5 10*3/uL (ref 0.1–1.0)
Monocytes Relative: 8.5 % (ref 3.0–12.0)
Neutro Abs: 3.6 10*3/uL (ref 1.4–7.7)
Neutrophils Relative %: 65.5 % (ref 43.0–77.0)
Platelets: 208 10*3/uL (ref 150.0–400.0)
RBC: 4.27 Mil/uL (ref 4.22–5.81)
RDW: 13.1 % (ref 11.5–15.5)
WBC: 5.5 10*3/uL (ref 4.0–10.5)

## 2022-11-15 LAB — VITAMIN D 25 HYDROXY (VIT D DEFICIENCY, FRACTURES): VITD: 33.66 ng/mL (ref 30.00–100.00)

## 2022-11-15 LAB — VITAMIN B12: Vitamin B-12: 256 pg/mL (ref 211–911)

## 2022-11-15 LAB — POC URINALSYSI DIPSTICK (AUTOMATED)
Blood, UA: NEGATIVE
Glucose, UA: NEGATIVE
Ketones, UA: NEGATIVE
Nitrite, UA: NEGATIVE
Protein, UA: NEGATIVE
Spec Grav, UA: 1.02 (ref 1.010–1.025)
Urobilinogen, UA: 0.2 E.U./dL
pH, UA: 7 (ref 5.0–8.0)

## 2022-11-15 LAB — LIPID PANEL
Cholesterol: 214 mg/dL — ABNORMAL HIGH (ref 0–200)
HDL: 63.8 mg/dL (ref >39.00)
LDL Cholesterol: 131 mg/dL — ABNORMAL HIGH (ref 0–99)
NonHDL: 149.9
Total CHOL/HDL Ratio: 3
Triglycerides: 95 mg/dL (ref 0.0–149.0)
VLDL: 19 mg/dL (ref 0.0–40.0)

## 2022-11-15 LAB — TSH: TSH: 1.4 u[IU]/mL (ref 0.35–5.50)

## 2022-11-15 LAB — PSA: PSA: 0.78 ng/mL (ref 0.10–4.00)

## 2022-11-16 DIAGNOSIS — R82998 Other abnormal findings in urine: Secondary | ICD-10-CM | POA: Diagnosis not present

## 2022-11-16 NOTE — Addendum Note (Signed)
Addended by: Kelle Darting A on: 11/16/2022 10:26 AM   Modules accepted: Orders

## 2022-11-17 LAB — URINE CULTURE
MICRO NUMBER:: 14525484
Result:: NO GROWTH
SPECIMEN QUALITY:: ADEQUATE

## 2022-11-18 ENCOUNTER — Other Ambulatory Visit: Payer: Self-pay

## 2022-11-19 LAB — ALPHA GAL IGE: GALACTOSE-ALPHA-1,3-GALACTOSE IGE*: 1.76 kU/L — ABNORMAL HIGH (ref <0.10)

## 2022-11-22 ENCOUNTER — Other Ambulatory Visit: Payer: Self-pay

## 2022-11-22 ENCOUNTER — Other Ambulatory Visit (HOSPITAL_BASED_OUTPATIENT_CLINIC_OR_DEPARTMENT_OTHER): Payer: Self-pay

## 2022-11-22 ENCOUNTER — Other Ambulatory Visit: Payer: Self-pay | Admitting: Family Medicine

## 2022-11-22 MED ORDER — BUDESONIDE-FORMOTEROL FUMARATE 80-4.5 MCG/ACT IN AERO
2.0000 | INHALATION_SPRAY | Freq: Two times a day (BID) | RESPIRATORY_TRACT | 3 refills | Status: DC
  Filled 2022-11-22: qty 30.6, 90d supply, fill #0
  Filled 2023-04-01: qty 30.6, 90d supply, fill #1
  Filled 2023-06-23: qty 30.6, 90d supply, fill #2

## 2022-12-09 ENCOUNTER — Ambulatory Visit (INDEPENDENT_AMBULATORY_CARE_PROVIDER_SITE_OTHER): Payer: Commercial Managed Care - PPO | Admitting: Family Medicine

## 2022-12-09 ENCOUNTER — Encounter: Payer: Self-pay | Admitting: Family Medicine

## 2022-12-09 VITALS — BP 118/68 | HR 72 | Temp 97.9°F | Resp 18 | Ht 70.0 in | Wt 218.0 lb

## 2022-12-09 DIAGNOSIS — Z0000 Encounter for general adult medical examination without abnormal findings: Secondary | ICD-10-CM | POA: Diagnosis not present

## 2022-12-09 DIAGNOSIS — F411 Generalized anxiety disorder: Secondary | ICD-10-CM

## 2022-12-09 DIAGNOSIS — C6290 Malignant neoplasm of unspecified testis, unspecified whether descended or undescended: Secondary | ICD-10-CM

## 2022-12-09 DIAGNOSIS — M5416 Radiculopathy, lumbar region: Secondary | ICD-10-CM | POA: Diagnosis not present

## 2022-12-09 DIAGNOSIS — G4733 Obstructive sleep apnea (adult) (pediatric): Secondary | ICD-10-CM | POA: Diagnosis not present

## 2022-12-09 DIAGNOSIS — J4520 Mild intermittent asthma, uncomplicated: Secondary | ICD-10-CM | POA: Diagnosis not present

## 2022-12-09 NOTE — Assessment & Plan Note (Signed)
Released from urology

## 2022-12-09 NOTE — Patient Instructions (Signed)

## 2022-12-09 NOTE — Assessment & Plan Note (Signed)
Per psychiatry

## 2022-12-09 NOTE — Assessment & Plan Note (Signed)
Doing well  Will start exercising again

## 2022-12-09 NOTE — Progress Notes (Signed)
Subjective:   By signing my name below, I, Shehryar Baig, attest that this documentation has been prepared under the direction and in the presence of Ann Held, DO. 12/09/2022   Patient ID: Daniel Branch, MD, male    DOB: 1960-12-17, 62 y.o.   MRN: [id number]  Chief Complaint  Patient presents with   Annual Exam    Pt states not fasting but has already had labs.    HPI Patient is in today for a comprehensive physical exam.   His asthma is flaring up recently. His occasionally smokes at night. He is planning on trying Symbicort to help manage his symptoms. He is taking Claritin to manage his symptoms.  He continues having eye pain in his left eye. He found an issues with his optic nerve but found no new issues with further inspection from his neurologist.  He is requesting a new CPAP machine. He has no issues with his current device but it is past 60 years old.  He continues following up with his dermatologist.  He reports having a score of 0 on his last coronary calcium sore last year.  He denies fever, new moles, congestion, sinus pain, sore throat, chest pain, palpitations, cough, shortness of breath, wheezing, nausea, vomiting, abdominal pain, diarrhea, constipation, dysuria, frequency, hematuria, new muscle pain, new joint pain, or headaches at this time.  He has no changes to family medical history. He has no new surgical procedures to report.  He occasionally smokes cigarettes at night.  He does not have the shingles vaccines and is waiting to receive it at another time.  He is going to the gym regularly for exercise.  Colonoscopy was last completed 09/02/2014. Results showed three tiny polyps in the descending Daniel, polypectomy was performed with a cold snare, otherwise results are normal. Repeat in 10 years.  PSA was last taken 11/15/2022, results showed 0.78.  He is UTD on dental and vision care.    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 08/2009    Family History  Problem Relation Age of Onset   Sjogren's syndrome Mother    COPD Mother    Daniel polyps Father    Dementia Father    Throat cancer Father    Melanoma Brother    Daniel cancer Neg Hx    Esophageal cancer Neg Hx    Stomach cancer Neg Hx    Rectal cancer Neg Hx    Liver disease Neg Hx     Social History   Socioeconomic History   Marital status: Married  Spouse name: Not on file   Number of children: 3   Years of education: MD   Highest education level: Not on file  Occupational History   Occupation: physician    Comment: internal medicine  Tobacco Use   Smoking status: Some Days    Packs/day: 0.25    Types: Cigarettes, Cigars   Smokeless tobacco: Never   Tobacco comments:    Occasional cigar  Vaping Use   Vaping Use: Never used  Substance and Sexual Activity   Alcohol use: Yes    Alcohol/week: 0.0 standard drinks of alcohol    Comment: occ   Drug use: No   Sexual activity: Yes    Partners: Female  Other Topics Concern   Not on file  Social History Narrative   Regular exercise: yes, very active   Caffeine use: 2 cups of coffee and soda's daily.   Right-handed.    Social Determinants of Health   Financial Resource Strain: Not on file  Food Insecurity: Not on file  Transportation Needs: Not on file  Physical Activity: Not on file  Stress: Not on file  Social Connections: Not on file  Intimate Partner Violence: Not on file    Outpatient Medications Prior to Visit  Medication Sig Dispense Refill   acetaminophen (TYLENOL) 500 MG tablet Take 500 mg by mouth every 6 (six) hours as needed.     albuterol (VENTOLIN HFA) 108 (90 Base) MCG/ACT inhaler Inhale 2 puffs into the lungs every 6 (six) hours as needed for wheezing or shortness of breath. 6.7 g 12   ALPRAZolam (XANAX) 0.5 MG tablet Take 1 tablet (0.5 mg total) by mouth 3 (three) times daily as needed. 270 tablet 0   budesonide-formoterol (SYMBICORT) 80-4.5 MCG/ACT inhaler Inhale 2 puffs into the lungs 2 (two) times daily. 30.6 g 3   cholecalciferol (VITAMIN D3) 25 MCG (1000 UT) tablet Take 1,000 Units by mouth daily.     cromolyn (OPTICROM) 4 % ophthalmic solution Place 2 drops into both eyes 4 (four) times daily. (Patient taking differently: Place 2 drops into both eyes 4 (four) times daily as needed.) 10 mL 12   EPINEPHrine (EPIPEN 2-PAK) 0.3 mg/0.3 mL IJ SOAJ injection Inject 0.3 mg into the muscle as needed for anaphylaxis. 2 each 3   FLUoxetine (PROZAC) 20 MG capsule Take 1 capsule (20 mg total) by mouth daily. 90 capsule 3   fluticasone (CUTIVATE) 0.005 % ointment APPLY 1 APPLICATION TOPICALLY 2 TIMES DAILY. (Patient taking differently: 2 (two) times daily as needed.) 30 g 3   ibuprofen (ADVIL,MOTRIN) 200 MG tablet Take 200 mg by mouth every 6 (six) hours as needed.     ketoconazole (NIZORAL) 2 % cream Apply 1 application topically 2 (two) times daily. 60 g 1   ketoconazole (NIZORAL) 2 % shampoo Use to wash the affected area every other day as matienance 120 mL 2   lidocaine (LINDAMANTLE) 3 % CREA cream APPLY 1 APPLICATION TOPICALLY AS NEEDED. 85 g 3   montelukast (SINGULAIR) 10 MG tablet Take  1 tablet (10 mg total) by mouth at bedtime. 90 tablet 3   NONFORMULARY OR COMPOUNDED ITEM RESMED Standard Air Tubing for CPAP 1 each 11   NONFORMULARY OR COMPOUNDED ITEM ResMed Swift FX nasal pillows mask (Medium) 1 each 11   pantoprazole (PROTONIX) 40 MG tablet Take 1 tablet (40 mg total) by mouth daily. 90 tablet 3   pseudoephedrine (SUDAFED) 60 MG tablet Take 1 tablet (60 mg total) by mouth every 4 (  four) hours as needed for congestion. 30 tablet 0   valACYclovir (VALTREX) 1000 MG tablet TAKE 1 TABLET BY MOUTH ONCE DAILY 90 tablet 3   COVID-19 mRNA Vac-TriS, Pfizer, (PFIZER-BIONT COVID-19 VAC-TRIS) SUSP injection Inject into the muscle. 0.3 mL 0   lidocaine (XYLOCAINE) 5 % ointment Apply 1 application topically as needed. 30 g 0   Naftifine HCl (NAFTIN) 2 % GEL Apply to feet twice a week for 2-3 weeks 60 g 1   nedocromil (ALOCRIL) 2 % ophthalmic solution Place 1 drop into both eyes 3 (three) times daily as needed. 5 mL 5   No facility-administered medications prior to visit.    Allergies  Allergen Reactions   Demerol [Meperidine Hcl]     Difficulty voiding   Food     Mammals- Alpha Gal Syndrome   Latex     REACTION: SOB, rash, itching   Morphine And Related     Difficulty voiding    Review of Systems  Constitutional:  Negative for fever.  HENT:  Negative for congestion, sinus pain and sore throat.   Respiratory:  Negative for cough, shortness of breath and wheezing.   Cardiovascular:  Negative for chest pain and palpitations.  Gastrointestinal:  Negative for abdominal pain, constipation, diarrhea, nausea and vomiting.  Genitourinary:  Negative for dysuria, frequency and hematuria.  Musculoskeletal:        (-)new muscle pain (-)new joint pain  Skin:        (-)New moles  Neurological:  Negative for headaches.       Objective:    Physical Exam Constitutional:      General: He is not in acute distress.    Appearance: Normal appearance. He is not ill-appearing.  HENT:      Head: Normocephalic and atraumatic.     Right Ear: Tympanic membrane, ear canal and external ear normal.     Left Ear: Tympanic membrane, ear canal and external ear normal.  Eyes:     Extraocular Movements: Extraocular movements intact.     Pupils: Pupils are equal, round, and reactive to light.  Cardiovascular:     Rate and Rhythm: Normal rate and regular rhythm.     Heart sounds: Normal heart sounds. No murmur heard.    No gallop.  Pulmonary:     Effort: Pulmonary effort is normal. No respiratory distress.     Breath sounds: Normal breath sounds. No wheezing or rales.  Abdominal:     General: Bowel sounds are normal. There is no distension.     Palpations: Abdomen is soft.     Tenderness: There is no abdominal tenderness. There is no guarding.  Skin:    General: Skin is warm and dry.  Neurological:     Mental Status: He is alert and oriented to person, place, and time.  Psychiatric:        Judgment: Judgment normal.     BP 118/68 (BP Location: Left Arm, Patient Position: Sitting, Cuff Size: Normal)   Pulse 72   Temp 97.9 F (36.6 C) (Oral)   Resp 18   Ht '5\' 10"'$  (1.778 m)   Wt 218 lb (98.9 kg)   SpO2 96%   BMI 31.28 kg/m  Wt Readings from Last 3 Encounters:  12/09/22 218 lb (98.9 kg)  08/30/22 224 lb (101.6 kg)  09/11/20 220 lb 9.6 oz (100.1 kg)       Assessment & Plan:  Preventative health care Assessment & Plan: Ghm utd Check labs  See AVS  Health Maintenance Due  Topic Date Due   Zoster Vaccines- Shingrix (1 of 2) Never done   COVID-19 Vaccine (5 - 2023-24 season) 06/11/2022      [facility])  Anxiety state Assessment & Plan: Per psychiatry   Mild intermittent asthma, unspecified whether complicated Assessment & Plan: Controlled with symbicort and albuterol    Lumbar radiculopathy Assessment & Plan: Doing well  Will start exercising again    OSA (obstructive sleep apnea) Assessment & Plan: On cpap     I, Ann Held,  DO, personally preformed the services described in this documentation.  All medical record entries made by the scribe were at my direction and in my presence.  I have reviewed the chart and discharge instructions (if applicable) and agree that the record reflects my personal performance and is accurate and complete. 12/09/2022   I,Shehryar Baig,acting as a scribe for Ann Held, DO.,have documented all relevant documentation on the behalf of Ann Held, DO,as directed by  Ann Held, DO while in the presence of Ann Held, DO.   Ann Held, DO

## 2022-12-09 NOTE — Assessment & Plan Note (Signed)
On cpap 

## 2022-12-09 NOTE — Assessment & Plan Note (Signed)
Ghm utd Check labs  See AVS   Health Maintenance Due  Topic Date Due   Zoster Vaccines- Shingrix (1 of 2) Never done   COVID-19 Vaccine (5 - 2023-24 season) 06/11/2022

## 2022-12-09 NOTE — Assessment & Plan Note (Signed)
Controlled with symbicort and albuterol

## 2022-12-20 ENCOUNTER — Other Ambulatory Visit (HOSPITAL_BASED_OUTPATIENT_CLINIC_OR_DEPARTMENT_OTHER): Payer: Self-pay

## 2022-12-28 ENCOUNTER — Other Ambulatory Visit: Payer: Self-pay

## 2022-12-28 DIAGNOSIS — G4733 Obstructive sleep apnea (adult) (pediatric): Secondary | ICD-10-CM

## 2023-01-06 LAB — CBC: RBC: 4.53 (ref 3.87–5.11)

## 2023-01-06 LAB — HEPATIC FUNCTION PANEL
ALT: 39 U/L (ref 10–40)
AST: 29 (ref 14–40)
Alkaline Phosphatase: 76 (ref 25–125)
Bilirubin, Total: 0.4

## 2023-01-06 LAB — CBC AND DIFFERENTIAL
HCT: 44 (ref 41–53)
Hemoglobin: 14.6 (ref 13.5–17.5)
Platelets: 215 10*3/uL (ref 150–400)
WBC: 6.1

## 2023-01-06 LAB — BASIC METABOLIC PANEL
BUN: 15 (ref 4–21)
Chloride: 101 (ref 99–108)
Creatinine: 1 (ref 0.6–1.3)
Glucose: 96
Potassium: 4.4 mEq/L (ref 3.5–5.1)
Sodium: 137 (ref 137–147)

## 2023-01-06 LAB — COMPREHENSIVE METABOLIC PANEL
Albumin: 4.4 (ref 3.5–5.0)
Calcium: 9.4 (ref 8.7–10.7)
Globulin: 2.2
eGFR: 84

## 2023-01-06 LAB — TSH: TSH: 1.32 (ref 0.41–5.90)

## 2023-01-06 LAB — LIPID PANEL
Cholesterol: 197 (ref 0–200)
HDL: 62 (ref 35–70)
LDL Cholesterol: 118
Triglycerides: 94 (ref 40–160)

## 2023-01-06 LAB — HEMOGLOBIN A1C: Hemoglobin A1C: 5.6

## 2023-01-11 ENCOUNTER — Other Ambulatory Visit (HOSPITAL_BASED_OUTPATIENT_CLINIC_OR_DEPARTMENT_OTHER): Payer: Self-pay

## 2023-01-11 ENCOUNTER — Other Ambulatory Visit: Payer: Self-pay

## 2023-01-11 MED ORDER — ALBUTEROL SULFATE HFA 108 (90 BASE) MCG/ACT IN AERS
2.0000 | INHALATION_SPRAY | Freq: Four times a day (QID) | RESPIRATORY_TRACT | 12 refills | Status: DC | PRN
  Filled 2023-01-11: qty 13.4, 50d supply, fill #0
  Filled 2023-05-17: qty 13.4, 50d supply, fill #1

## 2023-01-13 ENCOUNTER — Other Ambulatory Visit (HOSPITAL_BASED_OUTPATIENT_CLINIC_OR_DEPARTMENT_OTHER): Payer: Self-pay

## 2023-01-13 ENCOUNTER — Other Ambulatory Visit: Payer: Self-pay | Admitting: Family Medicine

## 2023-01-13 MED ORDER — PREDNISONE 20 MG PO TABS
ORAL_TABLET | ORAL | 0 refills | Status: DC
  Filled 2023-01-13: qty 15, 10d supply, fill #0

## 2023-02-15 ENCOUNTER — Other Ambulatory Visit: Payer: Self-pay | Admitting: Family Medicine

## 2023-02-15 DIAGNOSIS — G4733 Obstructive sleep apnea (adult) (pediatric): Secondary | ICD-10-CM

## 2023-02-16 ENCOUNTER — Encounter: Payer: Self-pay | Admitting: Family Medicine

## 2023-02-24 ENCOUNTER — Encounter: Payer: Self-pay | Admitting: Neurology

## 2023-02-24 ENCOUNTER — Ambulatory Visit: Payer: Commercial Managed Care - PPO | Admitting: Neurology

## 2023-02-24 VITALS — BP 107/68 | HR 66 | Ht 70.0 in | Wt 220.0 lb

## 2023-02-24 DIAGNOSIS — Z9989 Dependence on other enabling machines and devices: Secondary | ICD-10-CM | POA: Diagnosis not present

## 2023-02-24 DIAGNOSIS — G4733 Obstructive sleep apnea (adult) (pediatric): Secondary | ICD-10-CM | POA: Diagnosis not present

## 2023-02-24 NOTE — Progress Notes (Signed)
SLE[facility] MEDICINE CLINIC    Provider:  Melvyn Novas, Daniel Lam  Primary Care Physician:  Donato Schultz, DO [address] [place] [address]     Referring Provider: Virgina Organ [address] [place],  [place] [address]          Chief Complaint according to patient   Patient presents with:     New Patient (Initial Visit)     Patient states here today to transfer sleep care to Dr. Vickey Huger, has been treated for about 10 years for sleep apnea. He is CPAP dependent and uses CPAP every day, has a travel CPAP.        HISTORY OF PRESENT ILLNESS:  Daniel Plump, Daniel Lam is a 62 y.o. male patient who is seen upon referral on 02/24/2023 from Dr Laury Axon Almeta Monas for an OSA/  CPAP care transfer.   Chief concern according to patient :     I have the pleasure of seeing Daniel Plump, Daniel Lam 02/24/23 a right-handed male with a possible sleep disorder.    The patient had the first sleep study in the year 2014 HST with Dr Shelle Iron -  with a result of OSA and he  noted immediate benefit of CPAP as prescribed, the current machine is the only one ever issued to him , aside for travel CPAP- he bought that 3 years ago.     Sleep relevant medical history: Nocturia before CPAP ,no history of Sleep walking, Dream enactment-  not sleep talking.  Tonsillectomy at age 53 , wisdom teeth extracted-DDD cervical spine.   Family medical /sleep history: No other family member on CPAP with OSA.  Social history:  Patient is working as an Daniel Lam  and lives in a household with spouse and one son at home,  his daughters have moved on, 1 in [place] and one in [place].  No Pets are present Tobacco use: occasional cigar    ETOH use : 3 drinks a week-  Caffeine intake in form of Coffee( 2-3 AM ) Soda( /) Tea ( /) no energy drinks. Exercise in form of Gym exercises, 2 times a week. .   Hobbies : wood working.carpentry.       Sleep habits are as follows: The patient's dinner time is between 6-7.30 PM.  The patient goes to bed at 10.30 PM and asleep by 11 PM continues to sleep for 7 hours,he takes a xanax, 0.5 mg at night-  The preferred sleep position is lateral , with the support of 1 pillow with the nasal pillow. .  Dreams are reportedly frequent sometimes vivid-  The patient wakes up at 6.30 AM with an alarm. 7  AM is the usual rise time. Bedroom cool and quiet , not dark.  He reports feeling refreshed or restored in AM, without symptoms such as dry mouth, morning headaches, and residual fatigue.  Naps are taken frequently on days off work lasting from 1 to 1.25  hours and are sometimes less refreshing than nocturnal sleep.     The compliance report for the CPAP use is excellent the main CPAP locked 7 6 out of 90 days with the last date being 02-23-2023 and an average use of time of 6 hours 0 minutes on total days.  This has to be seen in context with the use of the travel CPAP which was used between 7 April through 20 April.  The CPAP is set  at a pressure of 13 cm water with 1 cm expiratory relief and the residual AHI while using nasal pillows is 1.0/h, making this an excellent resolution.  No central apneas are arising.  The air leak at the 95th percentile is 10.5 L/min which is very good for nasal pillow interfaces.   Review of Systems: Out of a complete 14 system review, the patient complains of only the following symptoms, and all other reviewed systems are negative.:     How likely are you to doze in the following situations: 0 = not likely, 1 = slight chance, 2 = moderate chance, 3 = high chance   Sitting and Reading? Watching Television? Sitting inactive in a public place (theater or meeting)? As a passenger in a car for an hour without a break? Lying down in the afternoon when circumstances permit? Sitting and talking to someone? Sitting quietly after lunch without alcohol? In a car, while stopped for a few minutes in traffic?   Total = 5/ 24 points   FSS endorsed at 19/ 63  points.   Social History   Socioeconomic History   Marital status: Married    Spouse name: Not on file   Number of children: 3   Years of education: Daniel Lam   Highest education level: Not on file  Occupational History   Occupation: physician    Comment: internal medicine  Tobacco Use   Smoking status: Some Days    Packs/day: .25    Types: Cigarettes, Cigars   Smokeless tobacco: Never   Tobacco comments:    Occasional cigar  Vaping Use   Vaping Use: Never used  Substance and Sexual Activity   Alcohol use: Yes    Alcohol/week: 0.0 standard drinks of alcohol    Comment: occ   Drug use: No   Sexual activity: Yes    Partners: Female  Other Topics Concern   Not on file  Social History Narrative   Regular exercise: yes, very active   Caffeine use: 2 cups of coffee and soda's daily.   Right-handed.   Social Determinants of Health   Financial Resource Strain: Not on file  Food Insecurity: Not on file  Transportation Needs: Not on file  Physical Activity: Not on file  Stress: Not on file  Social Connections: Not on file    Family History  Problem Relation Age of Onset   Sjogren's syndrome Mother    COPD Mother    Colon polyps Father    Dementia Father    Throat cancer Father    Melanoma Brother    Colon cancer Neg Hx    Esophageal cancer Neg Hx    Stomach cancer Neg Hx    Rectal cancer Neg Hx    Liver disease Neg Hx     Past Medical History:  Diagnosis Date   Anxiety state, unspecified    Atypical mole 2008   Complication of anesthesia    had some difficulty voiding   Dermatitis    anal dermatitis   Hemorrhoids    Herpes simplex without mention of complication    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 08/2009     Current Outpatient Medications on File Prior to Visit  Medication Sig Dispense Refill   acetaminophen (TYLENOL) 500 MG tablet Take 500 mg by mouth every 6 (six) hours as needed.     albuterol (VENTOLIN HFA) 108 (90 Base) MCG/ACT inhaler Inhale 2 puffs into the lungs every 6 (six) hours as needed for wheezing or shortness of breath. 6.7 g 12   ALPRAZolam (XANAX) 0.5 MG tablet Take 1 tablet (0.5 mg total) by mouth 3 (three) times daily as needed. 270 tablet 0   budesonide-formoterol (SYMBICORT) 80-4.5 MCG/ACT inhaler Inhale 2 puffs into the lungs 2 (two) times daily. 30.6 g 3   cholecalciferol (VITAMIN D3) 25 MCG (1000 UT) tablet Take 1,000 Units by mouth daily.     cromolyn (OPTICROM) 4 % ophthalmic solution Place 2 drops into both eyes 4 (four) times daily. (Patient taking differently: Place 2 drops into both eyes 4 (four) times daily as needed.) 10 mL 12   EPINEPHrine (EPIPEN 2-PAK) 0.3 mg/0.3 mL IJ SOAJ injection Inject 0.3 mg into the muscle as needed for anaphylaxis. 2 each 3   FLUoxetine (PROZAC) 20 MG capsule Take 1 capsule (20 mg total) by mouth daily. 90 capsule 3   fluticasone (CUTIVATE) 0.005 % ointment APPLY 1 APPLICATION TOPICALLY 2 TIMES DAILY. (Patient taking differently: 2 (two) times daily as needed.) 30 g 3   ibuprofen (ADVIL,MOTRIN) 200 MG tablet Take 200 mg by mouth every 6 (six) hours as needed.     ketoconazole (NIZORAL) 2 % cream Apply 1 application topically 2 (two) times daily. 60 g 1   ketoconazole (NIZORAL) 2 % shampoo Use to wash the affected area every other day as matienance 120 mL 2   lidocaine (LINDAMANTLE) 3 % CREA cream APPLY 1 APPLICATION TOPICALLY AS NEEDED. 85 g  3   montelukast (SINGULAIR) 10 MG tablet Take 1 tablet (10 mg total) by mouth at bedtime. 90 tablet 3   NONFORMULARY OR COMPOUNDED ITEM RESMED Standard Air Tubing for CPAP 1 each 11   NONFORMULARY OR COMPOUNDED ITEM ResMed Swift FX nasal pillows mask (Medium) 1 each 11   pantoprazole (PROTONIX) 40 MG tablet Take 1 tablet (40 mg total) by mouth daily. 90 tablet 3   pseudoephedrine (SUDAFED) 60 MG tablet Take 1 tablet (60 mg total) by mouth every 4 (four) hours as needed for congestion. 30 tablet 0   valACYclovir (VALTREX) 1000 MG tablet TAKE 1 TABLET BY MOUTH ONCE DAILY 90 tablet 3   predniSONE (DELTASONE) 20 MG tablet Take 2 tablets by mouth daily for 5 days, then take 1 tablet daily for 5 days as needed for asthma. (Patient not taking: Reported on 02/24/2023) 15 tablet 0   No current facility-administered medications on file prior to visit.    Allergies  Allergen Reactions   Demerol [Meperidine Hcl]     Difficulty voiding   Food     Mammals- Alpha Gal Syndrome   Latex     REACTION: SOB, rash, itching   Morphine And Codeine     Difficulty voiding     DIAGNOSTIC DATA (LABS, IMAGING, TESTING) - I reviewed patient records, labs, notes, testing and imaging myself where available.  Lab Results  Component Value Date   WBC  6.1 01/06/2023   HGB 14.6 01/06/2023   HCT 44 01/06/2023   MCV 96.2 11/15/2022   PLT 215 01/06/2023      Component Value Date/Time   NA 137 01/06/2023 0000   K 4.4 01/06/2023 0000   CL 101 01/06/2023 0000   CO2 30 11/15/2022 1204   GLUCOSE 102 (H) 11/15/2022 1204   BUN 15 01/06/2023 0000   CREATININE 1.0 01/06/2023 0000   CREATININE 1.09 11/15/2022 1204   CREATININE 1.01 07/16/2019 1228   CALCIUM 9.4 01/06/2023 0000   PROT 6.9 11/15/2022 1204   PROT 6.7 01/07/2021 0740   ALBUMIN 4.4 01/06/2023 0000   ALBUMIN 4.2 01/07/2021 0740   AST 29 01/06/2023 0000   ALT 39 01/06/2023 0000   ALKPHOS 76 01/06/2023 0000   BILITOT 0.5 11/15/2022 1204   BILITOT 0.5  01/07/2021 0740   GFRNONAA 83 07/16/2019 1228   GFRAA 96 07/16/2019 1228   Lab Results  Component Value Date   CHOL 197 01/06/2023   HDL 62 01/06/2023   LDLCALC 118 01/06/2023   LDLDIRECT 119.0 11/27/2015   TRIG 94 01/06/2023   CHOLHDL 3 11/15/2022   Lab Results  Component Value Date   HGBA1C 5.6 01/06/2023   Lab Results  Component Value Date   VITAMINB12 256 11/15/2022   Lab Results  Component Value Date   TSH 1.32 01/06/2023    PHYSICAL EXAM:  Today's Vitals   02/24/23 1042  BP: 107/68  Pulse: 66  Weight: 220 lb (99.8 kg)  Height: 5\' 10"  (1.778 m)   Body mass index is 31.57 kg/m.   Wt Readings from Last 3 Encounters:  02/24/23 220 lb (99.8 kg)  12/09/22 218 lb (98.9 kg)  08/30/22 224 lb (101.6 kg)     Ht Readings from Last 3 Encounters:  02/24/23 5\' 10"  (1.778 m)  12/09/22 5\' 10"  (1.778 m)  08/30/22 5\' 10"  (1.778 m)      General: The patient is awake, alert and appears not in acute distress. The patient is well groomed. Head: Normocephalic, atraumatic. Neck is supple.  Mallampati 1- elongated and deeply red uvula-   neck circumference:17 inches . Nasal airflow little bit congested- all year around -mostly patent.  Retrognathia is not seen.  Dental status: biological , Facial hair.  Cardiovascular:  Regular rate and cardiac rhythm by pulse,  without distended neck veins. Respiratory: Lungs are clear to auscultation.  Skin:  With evidence of ankle edema, or rash. Trunk: The patient's posture is erect.   NEUROLOGIC EXAM: The patient is awake and alert, oriented to place and time.   Memory subjective described as intact.  Attention span & concentration ability appears normal.  Speech is fluent,  without  dysarthria, dysphonia or aphasia.  Mood and affect are appropriate.   Cranial nerves: no loss of smell or taste reported  Pupils are equal and briskly reactive to light. Funduscopic exam deferred.  Extraocular movements in vertical and horizontal  planes were intact and without nystagmus. No Diplopia. Visual fields by finger perimetry are intact. Hearing was intact to soft voice and finger rubbing.    Facial sensation intact to fine touch.  Facial motor strength is symmetric and tongue and uvula move midline.  Neck ROM : rotation, tilt and flexion extension were normal for age and shoulder shrug was symmetrical.    Motor exam:  Symmetric bulk, tone and ROM.   Normal tone without cog wheeling, symmetric grip strength .   Sensory:  Fine touch, pinprick and vibration :  normal.  Proprioception tested in the upper extremities was normal.   Coordination: Rapid alternating movements in the fingers/hands were of normal speed.  The Finger-to-nose maneuver was intact without evidence of ataxia, dysmetria or tremor.   Gait and station: Patient could rise unassisted from a seated position, walked without assistive device.  Stance is of normal width/ base and the patient turned with 3 steps.  Toe and heel walk were deferred.  Deep tendon reflexes: in the  upper and lower extremities are symmetric and intact.  Babinski response was deferred.    ASSESSMENT AND PLAN 62 y.o. year old male  here with:  Years of compliant CPAP use, now needing a new machine , current device is 62 years old.     1) I will order a home sleep test for Dr. Past in order to establish a baseline.  This will be a WatchPAT device and will be able to measure arterial repolarization, heart rate, oximetry, it will also record sleep position and snoring.  I would like after the baseline to renew a CPAP prescription if apnea is still present.  The way his current machine is set at 13 cm water pressure was 1 cm expiratory relief setting should be reflected in his next machine.  I will use most likely an auto titration device by ResMed.  The patient is happy with using nasal pillows but if he likes to change it would be up to him and mid medical equipment company to fit and try  new .  I plan to follow up either personally or through our NP within 3-5 months.   I would like to thank  Donato Schultz, Do [address] [place],  [place] [address] for allowing me to meet with and to take care of this pleasant patient.   After spending a total time of  35  minutes face to face and additional time for physical and neurologic examination, review of laboratory studies,  personal review of imaging studies, reports and results of other testing and review of referral information / records as far as provided in visit,   Electronically signed by: Melvyn Novas, Daniel Lam 02/24/2023 11:10 AM  [facility] Board certified by The [employer] of Sleep Medicine and Diplomate of the [employer] of Sleep Medicine. Board certified In Neurology through the ABPN, Fellow of the [employer] of Neu[employer].

## 2023-02-24 NOTE — Patient Instructions (Signed)
62 year -old  male here with:  Years of compliant CPAP use, now needing a new machine - his  current ResMed device is 60 years old.     1) I will order a home sleep test for Dr. Drue Novel in order to establish a baseline.  This will be a WatchPAT device and will be able to measure arterial repolarization, heart rate, oximetry, it will also record sleep position and snoring.    I would like after the baseline to renew a CPAP prescription if apnea is still present.  The way his current machine is set at 13 cm water pressure was 1 cm expiratory relief setting- this  should be reflected in his next machine.   I will use most likely an auto titration device by ResMed.  The patient is happy with using nasal pillows but if he likes to change it would be up to him and mid medical equipment company to fit and try new .  I plan to follow up either personally or through our NP within 3-5 months.   I would like to thank  Donato Schultz, DO [address] [place],  [place] [address] for allowing me to meet with and to take care of this pleasant patient.

## 2023-02-28 ENCOUNTER — Telehealth: Payer: Self-pay | Admitting: Neurology

## 2023-02-28 NOTE — Telephone Encounter (Signed)
HST- [facility] a[employer] no auth req.  Sent patient mychart message.

## 2023-03-08 ENCOUNTER — Ambulatory Visit (INDEPENDENT_AMBULATORY_CARE_PROVIDER_SITE_OTHER): Payer: Commercial Managed Care - PPO | Admitting: Neurology

## 2023-03-08 DIAGNOSIS — G4733 Obstructive sleep apnea (adult) (pediatric): Secondary | ICD-10-CM | POA: Diagnosis not present

## 2023-03-08 DIAGNOSIS — Z9989 Dependence on other enabling machines and devices: Secondary | ICD-10-CM

## 2023-03-15 NOTE — Progress Notes (Signed)
[facility]   Dr. 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  The patient had undergone a first sleep study in the year 2014 HST under Dr Shelle Iron -  with a resulting Dx of OSA and he noted immediate benefit from CPAP after therapy was  prescribed. the current machine is the only one ever issued to him ( 62 years old)  , aside from a purchased  travel CPAP- he bought that 3 years ago.   The CPAP is set at a pressure of 13 cm water with 1 cm expiratory relief and the residual AHI while using nasal pillows is 1.0/h, making this an excellent resolution. No central apneas are arising. The air leak at the 95th percentile is 10.5 L/min which is very good for nasal pillow interfaces.    Sleep relevant medical history: fatigue before CPAP, Nocturia before CPAP , Tonsillectomy at age 74 , wisdom teeth extracted-DDD cervical spine, .    Epworth sleepiness score: 5/24. On CPAP,    BMI: 31.5 kg/m   Neck Circumference: 17"   FINDINGS:   Sleep Summary:   Total Recording Time (hours, min):   7 hours 41 minutes  Total Sleep Time (hours, min): 7 hours 2 minutes               Percent REM (%): 23.2%                                      Respiratory Indices:   Calculated pAHI (per hour):     9.9/h                        REM pAHI: 25/h                                               NREM pAHI:   5.4/h                           Supine AHI: The patient slept for most of the test time in supine position and this was associated with an AHI of 13.8/h over 61% of total sleep time.  This was followed by 164 minutes in right lateral sleep position with an AHI of  only 3.7/h.  Snoring statistics show a mean volume of 46 dB which is considered moderate to loud, snoring was present for 56% of the total sleep time and reached levels over 60 dB intermittently.                                                 Oxygen Saturation Statistics:   O2 Saturation Range (%):  Between the nadir at 82% of the maximum of 98% with a mean saturation of 94%                                  O2 Saturation (minutes) <89%:    0.9 minutes       Pulse Rate Statistics:   Pulse Mean (bpm): 68 bpm               Pulse Range: Between 48 and 98 bpm.  Please note that no cardiac rhythm data can be extracted through this home sleep test device.               IMPRESSION:  This HST confirms the presence of REM sleep dependent mild obstructive sleep apnea which was not associated with significant hypoxia but intermittent bradycardia was seen.   RECOMMENDATION: Continuation of CPAP therapy by auto titration ResMed device.  The patient is currently using 13 cm water pressure with 1 cm expiratory relief and I would use an outer titration device by ResMed to be set between 5 and 14 cm water pressure with 2 cm expiratory relief.  The patient also has been using nasal pillows and would like to address with the medical equipment company if there are other newer interfaces that he could use.    INTERPRETING PHYSICIAN:   Melvyn Novas, MD @ [facility].

## 2023-03-18 ENCOUNTER — Encounter: Payer: Self-pay | Admitting: Neurology

## 2023-03-18 NOTE — Telephone Encounter (Signed)
DR [facility] please result upon your return

## 2023-03-24 DIAGNOSIS — Z0100 Encounter for examination of eyes and vision without abnormal findings: Secondary | ICD-10-CM | POA: Diagnosis not present

## 2023-03-27 DIAGNOSIS — Z9989 Dependence on other enabling machines and devices: Secondary | ICD-10-CM | POA: Insufficient documentation

## 2023-03-27 NOTE — Addendum Note (Signed)
Addended by: Melvyn Novas on: 03/27/2023 07:09 PM   Modules accepted: Orders

## 2023-03-27 NOTE — Procedures (Signed)
[facility]   Dr. 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  The patient had undergone a first sleep study in the year 2014 HST under Dr Shelle Iron -  with a resulting Dx of OSA and he noted immediate benefit from CPAP after therapy was  prescribed. the current machine is the only one ever issued to him ( 62 years old)  , aside from a purchased  travel CPAP- he bought that 3 years ago.   The CPAP is set at a pressure of 13 cm water with 1 cm expiratory relief and the residual AHI while using nasal pillows is 1.0/h, making this an excellent resolution. No central apneas are arising. The air leak at the 95th percentile is 10.5 L/min which is very good for nasal pillow interfaces.    Sleep relevant medical history: fatigue before CPAP, Nocturia before CPAP , Tonsillectomy at age 87 , wisdom teeth extracted-DDD cervical spine, .    Epworth sleepiness score: 5/24. On CPAP,    BMI: 31.5 kg/m   Neck Circumference: 17"   FINDINGS:   Sleep Summary:   Total Recording Time (hours, min):   7 hours 41 minutes  Total Sleep Time (hours, min): 7 hours 2 minutes               Percent REM (%): 23.2%                                      Respiratory Indices:   Calculated pAHI (per hour):     9.9/h                        REM pAHI: 25/h                                               NREM pAHI:   5.4/h                           Supine AHI: The patient slept for most of the test time in supine position and this was associated with an AHI of 13.8/h over 61% of total sleep time.  This was followed by 164 minutes in right lateral sleep position with an AHI of only  3.7/h.  Snoring statistics show a mean volume of 46 dB which is considered moderate to loud, snoring was present for 56% of the total sleep time and reached levels over 60 dB intermittently.                                                 Oxygen Saturation Statistics:   O2 Saturation Range (%):     Between the nadir at  82% of the maximum of 98% with a mean saturation of 94%                                  O2 Saturation (minutes) <89%:    0.9 minutes       Pulse Rate Statistics:   Pulse Mean (bpm): 68 bpm               Pulse Range: Between 48 and 98 bpm.  Please note that no cardiac rhythm data can be extracted through this home sleep test device.               IMPRESSION:  This HST confirms the presence of REM sleep dependent mild obstructive sleep apnea which was not associated with significant hypoxia but intermittent bradycardia was seen.   RECOMMENDATION: Continuation of CPAP therapy by auto titration ResMed device.  The patient is currently using 13 cm water pressure with 1 cm expiratory relief and I would use an outer titration device by ResMed to be set between 5 and 14 cm water pressure with 2 cm expiratory relief.  The patient also has been using nasal pillows and would like to address with the medical equipment company if there are other newer interfaces that he could use.    INTERPRETING PHYSICIAN:   Melvyn Novas, MD @ [facility].

## 2023-03-27 NOTE — Progress Notes (Signed)
I already copied these results to Dr Drue Novel:  Mild but REM sleep dependent OSA is present, supine sleep is also contributing to AHI.  Avoiding supine sleep and losing weight are good conservative approaches to this apnea, and continuation of CPAP therapy will control snoring as well. I placed the DME order.

## 2023-03-28 ENCOUNTER — Telehealth: Payer: Self-pay

## 2023-03-28 NOTE — Telephone Encounter (Signed)
-----   Message from Melvyn Novas, MD sent at 03/27/2023  7:09 PM EDT -----  I already copied these results to Dr Drue Novel:  Mild but REM sleep dependent OSA is present, supine sleep is also contributing to AHI.  Avoiding supine sleep and losing weight are good conservative approaches to this apnea, and continuation of CPAP therapy will control snoring as well. I placed the DME order.

## 2023-03-31 ENCOUNTER — Other Ambulatory Visit: Payer: Self-pay | Admitting: Psychiatry

## 2023-03-31 ENCOUNTER — Telehealth: Payer: Self-pay | Admitting: Psychiatry

## 2023-03-31 ENCOUNTER — Other Ambulatory Visit (HOSPITAL_BASED_OUTPATIENT_CLINIC_OR_DEPARTMENT_OTHER): Payer: Self-pay

## 2023-03-31 ENCOUNTER — Encounter: Payer: Self-pay | Admitting: Psychiatry

## 2023-03-31 MED ORDER — BUSPIRONE HCL 15 MG PO TABS
ORAL_TABLET | ORAL | 0 refills | Status: DC
  Filled 2023-03-31: qty 180, 90d supply, fill #0

## 2023-03-31 NOTE — Telephone Encounter (Signed)
See message from patient. Last seen 09/2022. Please advise.   Alternative Viibryd with less sexual SE or Auvelity   Wants to consider switching to Buspar and try weaning fluoxetine.     Continue fluoxetine 20 mg daily until spring  for anxiety and mood and Xanax 0.75 mg for sleep. No Xanax daytime.

## 2023-03-31 NOTE — Telephone Encounter (Signed)
Will start BuSpar 15 mg 1/3 tablet twice daily for 1 week, then increase to 2/3 tablet twice daily for 1 week, then increase to 1 tablet twice daily for anxiety.  Wait 3 weeks and if feels ok about then reduce fluoxetine to every other day for 1 month and then stop it.. Can go higher on buspirone if needed.  rX sent

## 2023-03-31 NOTE — Telephone Encounter (Signed)
Patient called in stating that he and CC discussed him taking Buspar. He is currently taking Fluoxetine 20mg  and would like to being Buspar. He asked if he could take the Fluoxetine and Buspar at the same time and ween off the Fluoxetine. Ph: [phone] Appt 12/19 Please rtc to discuss

## 2023-04-01 ENCOUNTER — Other Ambulatory Visit (HOSPITAL_BASED_OUTPATIENT_CLINIC_OR_DEPARTMENT_OTHER): Payer: Self-pay

## 2023-04-01 ENCOUNTER — Other Ambulatory Visit: Payer: Self-pay

## 2023-04-01 ENCOUNTER — Other Ambulatory Visit: Payer: Self-pay | Admitting: Family Medicine

## 2023-04-01 MED ORDER — PREDNISONE 20 MG PO TABS
ORAL_TABLET | ORAL | 0 refills | Status: AC
  Filled 2023-04-01: qty 15, 10d supply, fill #0

## 2023-04-01 NOTE — Telephone Encounter (Signed)
Sent MyChart message with info.

## 2023-04-04 ENCOUNTER — Telehealth: Payer: Self-pay

## 2023-04-04 NOTE — Telephone Encounter (Signed)
Pt called and left a voicemail with concerns about DME company that he has been referred to - please call patient back at [phone] or patient said it was okay to MyChart msg to reach him.

## 2023-04-19 ENCOUNTER — Other Ambulatory Visit: Payer: Self-pay | Admitting: Family Medicine

## 2023-04-19 DIAGNOSIS — J398 Other specified diseases of upper respiratory tract: Secondary | ICD-10-CM

## 2023-04-20 NOTE — Addendum Note (Signed)
Addended byConrad Pepin D on: 04/20/2023 02:24 PM   Modules accepted: Orders

## 2023-04-21 ENCOUNTER — Ambulatory Visit
Admission: RE | Admit: 2023-04-21 | Discharge: 2023-04-21 | Disposition: A | Discharge status: Home / Self Care | Payer: Commercial Managed Care - PPO | Source: Ambulatory Visit | Attending: Family Medicine | Admitting: Family Medicine

## 2023-04-21 DIAGNOSIS — J398 Other specified diseases of upper respiratory tract: Secondary | ICD-10-CM | POA: Diagnosis not present

## 2023-04-27 ENCOUNTER — Telehealth: Payer: Self-pay | Admitting: Psychiatry

## 2023-04-27 ENCOUNTER — Other Ambulatory Visit: Payer: Self-pay | Admitting: Psychiatry

## 2023-04-27 ENCOUNTER — Other Ambulatory Visit (HOSPITAL_BASED_OUTPATIENT_CLINIC_OR_DEPARTMENT_OTHER): Payer: Self-pay

## 2023-04-27 DIAGNOSIS — F411 Generalized anxiety disorder: Secondary | ICD-10-CM

## 2023-04-27 MED ORDER — FLUOXETINE HCL 10 MG PO CAPS
10.0000 mg | ORAL_CAPSULE | Freq: Every day | ORAL | 1 refills | Status: DC
Start: 2023-04-27 — End: 2023-09-29
  Filled 2023-04-27: qty 90, 90d supply, fill #0
  Filled 2023-09-02: qty 90, 90d supply, fill #1

## 2023-04-27 NOTE — Telephone Encounter (Signed)
Pt called requesting Rx Fluoxetine 10 mg capsules.  Rx sent today

## 2023-04-28 DIAGNOSIS — G4733 Obstructive sleep apnea (adult) (pediatric): Secondary | ICD-10-CM | POA: Diagnosis not present

## 2023-05-12 DIAGNOSIS — L738 Other specified follicular disorders: Secondary | ICD-10-CM | POA: Diagnosis not present

## 2023-05-12 DIAGNOSIS — L821 Other seborrheic keratosis: Secondary | ICD-10-CM | POA: Diagnosis not present

## 2023-05-12 DIAGNOSIS — L570 Actinic keratosis: Secondary | ICD-10-CM | POA: Diagnosis not present

## 2023-05-12 DIAGNOSIS — D225 Melanocytic nevi of trunk: Secondary | ICD-10-CM | POA: Diagnosis not present

## 2023-05-12 DIAGNOSIS — D485 Neoplasm of uncertain behavior of skin: Secondary | ICD-10-CM | POA: Diagnosis not present

## 2023-05-12 DIAGNOSIS — Z872 Personal history of diseases of the skin and subcutaneous tissue: Secondary | ICD-10-CM | POA: Diagnosis not present

## 2023-05-12 DIAGNOSIS — L814 Other melanin hyperpigmentation: Secondary | ICD-10-CM | POA: Diagnosis not present

## 2023-05-12 DIAGNOSIS — L718 Other rosacea: Secondary | ICD-10-CM | POA: Diagnosis not present

## 2023-05-12 DIAGNOSIS — L910 Hypertrophic scar: Secondary | ICD-10-CM | POA: Diagnosis not present

## 2023-05-17 ENCOUNTER — Other Ambulatory Visit: Payer: Self-pay | Admitting: Family Medicine

## 2023-05-17 ENCOUNTER — Other Ambulatory Visit (HOSPITAL_BASED_OUTPATIENT_CLINIC_OR_DEPARTMENT_OTHER): Payer: Self-pay

## 2023-05-17 DIAGNOSIS — G8929 Other chronic pain: Secondary | ICD-10-CM

## 2023-05-18 ENCOUNTER — Other Ambulatory Visit (HOSPITAL_BASED_OUTPATIENT_CLINIC_OR_DEPARTMENT_OTHER): Payer: Self-pay

## 2023-05-18 MED ORDER — METRONIDAZOLE 0.75 % EX CREA
TOPICAL_CREAM | Freq: Two times a day (BID) | CUTANEOUS | 3 refills | Status: DC
  Filled 2023-05-18: qty 45, 30d supply, fill #0

## 2023-05-19 ENCOUNTER — Other Ambulatory Visit (HOSPITAL_BASED_OUTPATIENT_CLINIC_OR_DEPARTMENT_OTHER): Payer: Self-pay

## 2023-05-23 ENCOUNTER — Other Ambulatory Visit (HOSPITAL_BASED_OUTPATIENT_CLINIC_OR_DEPARTMENT_OTHER): Payer: Self-pay

## 2023-05-23 MED ORDER — COVID-19 MRNA VAC-TRIS(PFIZER) 30 MCG/0.3ML IM SUSY
0.3000 mL | PREFILLED_SYRINGE | Freq: Once | INTRAMUSCULAR | 0 refills | Status: AC
  Filled 2023-05-23: qty 0.3, 1d supply, fill #0

## 2023-05-29 ENCOUNTER — Other Ambulatory Visit: Payer: Self-pay | Admitting: Psychiatry

## 2023-05-29 DIAGNOSIS — G4733 Obstructive sleep apnea (adult) (pediatric): Secondary | ICD-10-CM | POA: Diagnosis not present

## 2023-05-29 DIAGNOSIS — F5105 Insomnia due to other mental disorder: Secondary | ICD-10-CM

## 2023-05-30 ENCOUNTER — Other Ambulatory Visit (HOSPITAL_BASED_OUTPATIENT_CLINIC_OR_DEPARTMENT_OTHER): Payer: Self-pay

## 2023-05-30 MED ORDER — ALPRAZOLAM 0.5 MG PO TABS
0.5000 mg | ORAL_TABLET | Freq: Three times a day (TID) | ORAL | 0 refills | Status: DC | PRN
  Filled 2023-05-30: qty 270, 90d supply, fill #0

## 2023-06-01 DIAGNOSIS — M545 Low back pain, unspecified: Secondary | ICD-10-CM | POA: Diagnosis not present

## 2023-06-01 DIAGNOSIS — R262 Difficulty in walking, not elsewhere classified: Secondary | ICD-10-CM | POA: Diagnosis not present

## 2023-06-29 DIAGNOSIS — G4733 Obstructive sleep apnea (adult) (pediatric): Secondary | ICD-10-CM | POA: Diagnosis not present

## 2023-06-30 ENCOUNTER — Encounter: Payer: Self-pay | Admitting: Allergy and Immunology

## 2023-06-30 ENCOUNTER — Ambulatory Visit: Payer: Commercial Managed Care - PPO | Admitting: Allergy and Immunology

## 2023-06-30 ENCOUNTER — Other Ambulatory Visit (HOSPITAL_BASED_OUTPATIENT_CLINIC_OR_DEPARTMENT_OTHER): Payer: Self-pay

## 2023-06-30 VITALS — BP 118/78 | HR 76 | Resp 14 | Ht 69.0 in | Wt 221.0 lb

## 2023-06-30 DIAGNOSIS — T7800XA Anaphylactic reaction due to unspecified food, initial encounter: Secondary | ICD-10-CM | POA: Diagnosis not present

## 2023-06-30 DIAGNOSIS — K219 Gastro-esophageal reflux disease without esophagitis: Secondary | ICD-10-CM

## 2023-06-30 DIAGNOSIS — J4540 Moderate persistent asthma, uncomplicated: Secondary | ICD-10-CM

## 2023-06-30 DIAGNOSIS — J3089 Other allergic rhinitis: Secondary | ICD-10-CM

## 2023-06-30 MED ORDER — PANTOPRAZOLE SODIUM 40 MG PO TBEC
40.0000 mg | DELAYED_RELEASE_TABLET | Freq: Two times a day (BID) | ORAL | 1 refills | Status: DC
  Filled 2023-06-30: qty 180, 90d supply, fill #0

## 2023-06-30 MED ORDER — BREZTRI AEROSPHERE 160-9-4.8 MCG/ACT IN AERO
2.0000 | INHALATION_SPRAY | Freq: Two times a day (BID) | RESPIRATORY_TRACT | 1 refills | Status: DC
  Filled 2023-06-30: qty 10.7, 30d supply, fill #0

## 2023-06-30 NOTE — Progress Notes (Signed)
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  I had seen him in this clinic 2019 for an issue with alpha gal.  He is here today to discuss some breathing problems.  He has had a history of some exercise-induced asthma which was under very good control until the past 2 to 3 years at which point in time this became a progressive issue especially over the course of the past 6 months.  He describes shortness of breath and he has throat clearing and phlegm and cough that comes up from his chest.  He has started to use Symbicort on a regular basis instead of as needed and added in Singulair and Allegra over the course of the past 2 weeks and this may have helped him somewhat yet he still continues to have the symptoms.  There is not really a history of allergenic triggers giving rise to this issue but he does believe that viral triggers are a major problem with his pre-existing asthma and he cannot understand or identify any obvious trigger that has arisen giving rise to his most recent respiratory tract symptoms.    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  And he continues to smoke at relatively low numbers of cigarettes per day but still has exposure to a few per day.  And he has reflux disease that is currently being treated with a proton pump inhibitor and he does notice that his cough appears to be precipitated to some degree after eating.  He has history of alpha gal sensitivity is  easily managed by remaining away from mammal consumption.  There was a question of whether or not he had reactivity directed against seafood but he can eat seafood now with no problem.  He has had a chest x-ray recently which has been okay.  Allergies as of 06/30/2023       Reactions   Demerol [meperidine Hcl]    Difficulty voiding   Food    Mammals- Alpha Gal Syndrome   Latex    REACTION: SOB, rash, itching   Morphine And Codeine    Difficulty voiding        Medication List    albuterol 108 (90 Base) MCG/ACT inhaler Commonly known as: VENTOLIN HFA Inhale 2 puffs into the lungs every 6 (six) hours as needed for wheezing or shortness of breath.   ALPRAZolam 0.5 MG tablet Commonly known as: XANAX Take 1 tablet (0.5 mg total) by mouth 3 (three) times daily as needed.   budesonide-formoterol 80-4.5 MCG/ACT inhaler Commonly known as: SYMBICORT Inhale 2 puffs into the lungs 2 (two) times daily.   EPINEPHrine 0.3 mg/0.3 mL Soaj injection Commonly known as: EpiPen 2-Pak Inject 0.3 mg into the muscle as needed for anaphylaxis.   fexofenadine 60 MG tablet Commonly known as: ALLEGRA Take 60 mg by mouth 2 (two) times daily.   FLUoxetine 10 MG capsule Commonly known as: PROZAC Take 1 capsule (10 mg total)  by mouth daily.   metroNIDAZOLE 0.75 % cream Commonly known as: METROCREAM Apply topically 2 (two) times daily to face.   montelukast 10 MG tablet Commonly known as: SINGULAIR Take 1 tablet (10 mg total) by mouth at bedtime.   pantoprazole 40 MG tablet Commonly known as: PROTONIX Take 1 tablet (40 mg total) by mouth daily.   valACYclovir 1000 MG tablet Commonly known as: VALTREX Take 1 tablet by mouth daily.     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 08/2009    Review of systems negative except as noted in HPI / PMHx or noted below:  Review of Systems  Constitutional: Negative.   HENT: Negative.    Eyes: Negative.   Respiratory: Negative.    Cardiovascular: Negative.   Gastrointestinal: Negative.   Genitourinary: Negative.   Musculoskeletal: Negative.   Skin: Negative.   Neurological: Negative.   Endo/Heme/Allergies: Negative.   Psychiatric/Behavioral: Negative.       Objective:   Vitals:   06/30/23 1106  BP: 118/78  Pulse: 76  Resp: 14  SpO2: 94%   Height: 5\' 9"  (175.3 cm)  Weight: 221 lb (100.2 kg)   Physical Exam Constitutional:      Appearance: He is not diaphoretic.  HENT:     Head: Normocephalic.     Right Ear: Tympanic membrane, ear canal and external ear normal.     Left Ear: Tympanic membrane, ear canal and external ear normal.     Nose: Nose normal. No mucosal edema or rhinorrhea.     Mouth/Throat:     Pharynx: Uvula midline. No oropharyngeal exudate.  Eyes:     Conjunctiva/sclera: Conjunctivae normal.  Neck:     Thyroid: No thyromegaly.     Trachea: Trachea normal. No tracheal tenderness or tracheal deviation.  Cardiovascular:     Rate and Rhythm: Normal rate and regular rhythm.     Heart sounds: Normal heart sounds, S1 normal and S2 normal. No murmur heard. Pulmonary:     Effort: No respiratory distress.      Breath sounds: Normal breath sounds. No stridor. No wheezing or rales.  Lymphadenopathy:     Head:     Right side of head: No tonsillar adenopathy.     Left side of head: No tonsillar adenopathy.     Cervical: No cervical adenopathy.  Skin:    Findings: No erythema or rash.     Nails: There is no clubbing.  Neurological:     Mental Status: He is alert.     Diagnostics:    Spirometry was performed and  demonstrated an FEV1 of 3.80 at 118 % of predicted.  Results of a chest x-ray obtained 22 August 2023 identifies the following:  The heart and mediastinal contours are unchanged. No focal consolidation. No pulmonary edema. No pleural effusion. No pneumothorax.  Assessment and Plan:   1. Not well controlled moderate persistent asthma   2. Other allergic rhinitis   3. LPRD (laryngopharyngeal reflux disease)   4. Allergy with anaphylaxis due to food    1. Treat and prevent inflammation of airway:   A. Breztri - 2 inhalations 2 times per day w/ spacer (empty lungs)  B. OTC Nasacort or Flonase - 2 sprays each nostril 1 time per day  2. Treat and prevent reflux induced inflammation of airway:   A. Increase Pantoprazole 40 mg - 2 times per day  B. Decrease caffeine consumption  3. Prevent smoke and particle induced inflammation of airway:   A. Decrease caffeine to decrease anxiety  B. Use non-smokable nicotine as replacement for smoke  C. Mask and cross current ventilation with woodworking  4. If needed:   A. Airsupra or Symbicort 80 - 2 inhalations every 6 hours  B. Fexofenadine  C. Epi-Pen  5. Montelukast ???  6. 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  I am not sure that montelukast has helped him very much and he can attempt to discontinue that agent.  Will see what happens  over the course of the next 4 weeks.  Laurette Schimke, MD Allergy / Immunology [facility]

## 2023-06-30 NOTE — Patient Instructions (Addendum)
  1. Treat and prevent inflammation of airway:   A. Breztri - 2 inhalations 2 times per day w/ spacer (empty lungs)  B. OTC Nasacort or Flonase - 2 sprays each nostril 1 time per day  2. Treat and prevent reflux induced inflammation of airway:   A. Increase Pantoprazole 40 mg - 2 times per day  B. Decrease caffeine consumption  3. Prevent smoke and particle induced inflammation of airway:   A. Decrease caffeine to decrease anxiety  B. Use non-smokable nicotine as replacement for smoke  C. Mask and cross current ventilation with woodworking  4. If needed:   A. Airsupra or Symbicort 80 - 2 inhalations every 6 hours  B. Fexofenadine  C. Epi-Pen  5. Montelukast ???  6. Return in 4 weeks or earlier if problem

## 2023-07-01 ENCOUNTER — Other Ambulatory Visit (HOSPITAL_BASED_OUTPATIENT_CLINIC_OR_DEPARTMENT_OTHER): Payer: Self-pay

## 2023-07-01 ENCOUNTER — Telehealth: Payer: Self-pay | Admitting: Family

## 2023-07-01 MED ORDER — PREDNISONE 20 MG PO TABS
ORAL_TABLET | ORAL | 0 refills | Status: AC
  Filled 2023-07-01: qty 21, 12d supply, fill #0

## 2023-07-01 NOTE — Telephone Encounter (Signed)
Patient will be travelling to [place] and is requesting rx for prednisone to have on hand in case of food allergy reaction.

## 2023-07-04 ENCOUNTER — Encounter: Payer: Self-pay | Admitting: Allergy and Immunology

## 2023-07-24 ENCOUNTER — Emergency Department (HOSPITAL_BASED_OUTPATIENT_CLINIC_OR_DEPARTMENT_OTHER): Payer: Commercial Managed Care - PPO | Admitting: Radiology

## 2023-07-24 ENCOUNTER — Emergency Department (HOSPITAL_BASED_OUTPATIENT_CLINIC_OR_DEPARTMENT_OTHER)
Admission: EM | Admit: 2023-07-24 | Discharge: 2023-07-24 | Disposition: A | Discharge status: Home / Self Care | Payer: Commercial Managed Care - PPO | Attending: Emergency Medicine | Admitting: Emergency Medicine

## 2023-07-24 ENCOUNTER — Encounter (HOSPITAL_BASED_OUTPATIENT_CLINIC_OR_DEPARTMENT_OTHER): Payer: Self-pay

## 2023-07-24 ENCOUNTER — Emergency Department (HOSPITAL_BASED_OUTPATIENT_CLINIC_OR_DEPARTMENT_OTHER): Payer: Commercial Managed Care - PPO

## 2023-07-24 DIAGNOSIS — F17200 Nicotine dependence, unspecified, uncomplicated: Secondary | ICD-10-CM | POA: Insufficient documentation

## 2023-07-24 DIAGNOSIS — R079 Chest pain, unspecified: Secondary | ICD-10-CM | POA: Diagnosis not present

## 2023-07-24 DIAGNOSIS — R0789 Other chest pain: Secondary | ICD-10-CM | POA: Diagnosis not present

## 2023-07-24 DIAGNOSIS — Z91040 Latex allergy status: Secondary | ICD-10-CM | POA: Insufficient documentation

## 2023-07-24 DIAGNOSIS — J45909 Unspecified asthma, uncomplicated: Secondary | ICD-10-CM | POA: Diagnosis not present

## 2023-07-24 LAB — BASIC METABOLIC PANEL
Anion gap: 7 (ref 5–15)
BUN: 20 mg/dL (ref 6–20)
CO2: 30 mmol/L (ref 22–32)
Calcium: 9.6 mg/dL (ref 8.9–10.3)
Chloride: 102 mmol/L (ref 98–111)
Creatinine, Ser: 1.05 mg/dL (ref 0.61–1.24)
GFR, Estimated: >60 mL/min (ref >60)
Glucose, Bld: 100 mg/dL — ABNORMAL HIGH (ref 70–99)
Potassium: 3.4 mmol/L — ABNORMAL LOW (ref 3.5–5.1)
Sodium: 139 mmol/L (ref 135–145)

## 2023-07-24 LAB — TROPONIN I (HIGH SENSITIVITY)
Troponin I (High Sensitivity): 4 ng/L (ref <18)
Troponin I (High Sensitivity): 4 ng/L (ref <18)

## 2023-07-24 LAB — URINALYSIS, ROUTINE W REFLEX MICROSCOPIC
Bilirubin Urine: NEGATIVE
Glucose, UA: NEGATIVE mg/dL
Hgb urine dipstick: NEGATIVE
Ketones, ur: NEGATIVE mg/dL
Leukocytes,Ua: NEGATIVE
Nitrite: NEGATIVE
Protein, ur: NEGATIVE mg/dL
Specific Gravity, Urine: 1.038 — ABNORMAL HIGH (ref 1.005–1.030)
pH: 5.5 (ref 5.0–8.0)

## 2023-07-24 LAB — CBC
HCT: 41.7 % (ref 39.0–52.0)
Hemoglobin: 14.5 g/dL (ref 13.0–17.0)
MCH: 32.8 pg (ref 26.0–34.0)
MCHC: 34.8 g/dL (ref 30.0–36.0)
MCV: 94.3 fL (ref 80.0–100.0)
Platelets: 220 10*3/uL (ref 150–400)
RBC: 4.42 MIL/uL (ref 4.22–5.81)
RDW: 11.9 % (ref 11.5–15.5)
WBC: 7.8 10*3/uL (ref 4.0–10.5)
nRBC: 0 % (ref 0.0–0.2)

## 2023-07-24 MED ORDER — PREDNISONE 20 MG PO TABS
40.0000 mg | ORAL_TABLET | Freq: Every day | ORAL | 0 refills | Status: AC
  Filled 2023-07-24: qty 10, 5d supply, fill #0

## 2023-07-24 MED ORDER — IPRATROPIUM-ALBUTEROL 0.5-2.5 (3) MG/3ML IN SOLN
3.0000 mL | Freq: Once | RESPIRATORY_TRACT | Status: AC
  Administered 2023-07-24: 3 mL via RESPIRATORY_TRACT
  Filled 2023-07-24: qty 3

## 2023-07-24 MED ORDER — IOHEXOL 350 MG/ML SOLN
75.0000 mL | Freq: Once | INTRAVENOUS | Status: AC | PRN
  Administered 2023-07-24: 75 mL via INTRAVENOUS

## 2023-07-24 MED ORDER — PREDNISONE 50 MG PO TABS
60.0000 mg | ORAL_TABLET | Freq: Once | ORAL | Status: AC
  Administered 2023-07-24: 60 mg via ORAL
  Filled 2023-07-24: qty 1

## 2023-07-24 NOTE — Discharge Instructions (Signed)
Your lab work today was reassuring including troponin.  Your CT scan did not show any evidence of blood clot or pneumonia.  You should schedule follow-up with your primary care provider and cardiology.  You develop worsening chest pain, difficulty breathing or any other new concerning symptoms you should return to the ED.

## 2023-07-24 NOTE — ED Provider Notes (Signed)
3:31 PM Patient signed out to me by previous ED physician. Pt is a 62 yo male with pmh of asthma, anxiety, and current tobacco use presenting for right sided chest pain and sob.   ECG stable. Initial trop stable. Delta trop pending. CT PE neg. No pneumonia. Some improvement with duoneb..Lung exam unimpressive. Some dysuria. Ua pending.   Plan: f/u delta trop and UA.       Physical Exam  BP (!) 135/120   Pulse 68   Temp 98.4 F (36.9 C) (Oral)   Resp 13   SpO2 100%   Physical Exam  Procedures  Procedures  ED Course / MDM    Medical Decision Making Amount and/or Complexity of Data Reviewed Labs: ordered. Radiology: ordered.  Risk Prescription drug management.   Stable ECG.  Troponin and repeat troponin 4.  CT PE stable.  Consider costochondritis further right-sided chest pain.  UA collected as requested.  Otherwise stable.  No UTI.  Patient stable for discharge home with recommendations for close follow-up with primary care physician and/or cardiology symptoms do not resolve.  Patient in no distress and overall condition improved here in the ED. Detailed discussions were had with the patient regarding current findings, and need for close f/u with PCP or on call doctor. The patient has been instructed to return immediately if the symptoms worsen in any way for re-evaluation. Patient verbalized understanding and is in agreement with current care plan. All questions answered prior to discharge.        Edwin Dada P, DO 07/24/23 1659

## 2023-07-24 NOTE — ED Triage Notes (Signed)
He c/o right-sided chest discomfort which at times radiates through to back. He cites a recent 7 hour plane flight. He states the pain is worse with movement and deep breath. His skin is normal, warm and dry and he is breathing normally.

## 2023-07-24 NOTE — ED Provider Notes (Signed)
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  He states he recently returned from [place] about a week ago.  There he felt somewhat short of breath.  Since Tuesday he has had some gradually worsening chest pain.  His right sided, goes into his right shoulder somewhat.  It is sharp, pleuritic.  It is not exertional.  No history of ACS or stroke or PE or DVT.  No lower extremity edema.  He has not had any fevers or cough.  He has not any wheezing, but wonders if he has had an asthma exacerbation.  He took some prednisone earlier so week to see if this would help.  He does note some slight urinary frequency.  He is otherwise been at his baseline health.  Pain is still there right now but improved.  No abdominal pain or nausea or vomiting or diaphoresis.  He does smoke.     Home Medications Prior to Admission medications   Medication Sig Start Date End Date Taking? Authorizing Provider  predniSONE (DELTASONE) 20 MG tablet Take 2 tablets (40 mg total) by mouth daily for 5 days. 07/24/23 07/29/23 Yes Laurence Spates, MD  albuterol (VENTOLIN HFA) 108 (90 Base) MCG/ACT inhaler Inhale 2 puffs into the lungs every 6 (six) hours as needed for wheezing or shortness of breath. 01/11/23   Donato Schultz, DO  ALPRAZolam (XANAX) 0.5 MG tablet Take 1 tablet (0.5 mg total) by mouth 3 (three) times daily as needed. 05/30/23   Cottle, Steva Ready., MD  Budeson-Glycopyrrol-Formoterol (BREZTRI AEROSPHERE) 160-9-4.8 MCG/ACT AERO Inhale 2 puffs with spacer into the lungs 2 (two) times daily to prevent cough or wheezing. Rinse, gargle, and spit after use. 06/30/23   Kozlow, Alvira Philips, MD  budesonide-formoterol (SYMBICORT) 80-4.5  MCG/ACT inhaler Inhale 2 puffs into the lungs 2 (two) times daily. 11/22/22   Donato Schultz, DO  EPINEPHrine (EPIPEN 2-PAK) 0.3 mg/0.3 mL IJ SOAJ injection Inject 0.3 mg into the muscle as needed for anaphylaxis. 01/27/21   Donato Schultz, DO  fexofenadine (ALLEGRA) 60 MG tablet Take 60 mg by mouth 2 (two) times daily.    [provider]  FLUoxetine (PROZAC) 10 MG capsule Take 1 capsule (10 mg total) by mouth daily. 04/27/23 04/26/24  Cottle, Steva Ready., MD  metroNIDAZOLE (METROCREAM) 0.75 % cream Apply topically 2 (two) times daily to face. 05/12/23     montelukast (SINGULAIR) 10 MG tablet Take 1 tablet (10 mg total) by mouth at bedtime. 11/12/22   Donato Schultz, DO  pantoprazole (PROTONIX) 40 MG tablet Take 1 tablet (40 mg total) by mouth 2 (two) times daily. 06/30/23   Kozlow, Alvira Philips, MD  valACYclovir (VALTREX) 1000 MG tablet Take 1 tablet by mouth daily. 04/10/19   [provider]      Allergies    Demerol [meperidine hcl], Food, Latex, and Morphine and codeine    Review of Systems   Review of Systems  Cardiovascular:  Positive for chest pain.  Review of systems completed and notable as per HPI.  ROS otherwise negative.   Physical Exam Updated Vital Signs BP (!) 135/120   Pulse  68   Temp 98.4 F (36.9 C) (Oral)   Resp 13   SpO2 100%  Physical Exam Vitals and nursing note reviewed.  Constitutional:      General: He is not in acute distress.    Appearance: He is well-developed.  HENT:     Head: Normocephalic and atraumatic.     Nose: Nose normal.     Mouth/Throat:     Mouth: Mucous membranes are moist.     Pharynx: Oropharynx is clear.  Eyes:     Extraocular Movements: Extraocular movements intact.     Conjunctiva/sclera: Conjunctivae normal.     Pupils: Pupils are equal, round, and reactive to light.  Cardiovascular:     Rate and Rhythm: Normal rate and regular rhythm.     Pulses: Normal pulses.     Heart sounds: Normal heart sounds. No  murmur heard. Pulmonary:     Effort: Pulmonary effort is normal. No respiratory distress.     Breath sounds: Normal breath sounds.  Abdominal:     Palpations: Abdomen is soft.     Tenderness: There is no abdominal tenderness.  Musculoskeletal:        General: No swelling.     Cervical back: Neck supple.     Right lower leg: No edema.     Left lower leg: No edema.  Skin:    General: Skin is warm and dry.     Capillary Refill: Capillary refill takes less than 2 seconds.  Neurological:     General: No focal deficit present.     Mental Status: He is alert and oriented to person, place, and time. Mental status is at baseline.  Psychiatric:        Mood and Affect: Mood normal.     ED Results / Procedures / Treatments   Labs (all labs ordered are listed, but only abnormal results are displayed) Labs Reviewed  BASIC METABOLIC PANEL - Abnormal; Notable for the following components:      Result Value   Potassium 3.4 (*)    Glucose, Bld 100 (*)    All other components within normal limits  CBC  URINALYSIS, ROUTINE W REFLEX MICROSCOPIC  TROPONIN I (HIGH SENSITIVITY)  TROPONIN I (HIGH SENSITIVITY)    EKG EKG Interpretation Date/Time:  Sunday July 24 2023 12:59:50 EDT Ventricular Rate:  65 PR Interval:  152 QRS Duration:  99 QT Interval:  370 QTC Calculation: 385 R Axis:   63  Text Interpretation: Sinus rhythm Abnormal R-wave progression, early transition Confirmed by Fulton Reek [phone]) on 07/24/2023 1:02:28 PM  Radiology CT Angio Chest PE W/Cm &/Or Wo Cm  Result Date: 07/24/2023 CLINICAL DATA:  Right-sided chest pain, PE suspected EXAM: CT ANGIOGRAPHY CHEST WITH CONTRAST TECHNIQUE: Multidetector CT imaging of the chest was performed using the standard protocol during bolus administration of intravenous contrast. Multiplanar CT image reconstructions and MIPs were obtained to evaluate the vascular anatomy. RADIATION DOSE REDUCTION: This exam was performed according to  the departmental dose-optimization program which includes automated exposure control, adjustment of the mA and/or kV according to patient size and/or use of iterative reconstruction technique. CONTRAST:  75mL OMNIPAQUE IOHEXOL 350 MG/ML SOLN COMPARISON:  09/29/2020 FINDINGS: Cardiovascular: Satisfactory opacification of the pulmonary arteries to the segmental level. No evidence of pulmonary embolism. Normal heart size. No pericardial effusion. Mediastinum/Nodes: No enlarged mediastinal, hilar, or axillary lymph nodes. Thyroid gland, trachea, and esophagus demonstrate no significant findings. Lungs/Pleura: Unchanged, definitively benign nodule of the medial right upper lobe for which  no further follow-up or characterization is required (series 7, image 63). No pleural effusion or pneumothorax. Upper Abdomen: No acute abnormality. Musculoskeletal: No chest wall abnormality. No acute osseous findings. Review of the MIP images confirms the above findings. IMPRESSION: Negative examination for pulmonary embolism. No acute CT findings of the chest to explain right-sided chest pain. Electronically Signed   By: Jearld Lesch M.D.   On: 07/24/2023 14:30   DG Chest 2 View  Result Date: 07/24/2023 CLINICAL DATA:  Right-sided chest discomfort. EXAM: CHEST - 2 VIEW COMPARISON:  Chest x-ray dated April 21, 2023. FINDINGS: The heart size and mediastinal contours are within normal limits. Both lungs are clear. The visualized skeletal structures are unremarkable. IMPRESSION: No active cardiopulmonary disease. Electronically Signed   By: Obie Dredge M.D.   On: 07/24/2023 14:05    Procedures Procedures    Medications Ordered in ED Medications  ipratropium-albuterol (DUONEB) 0.5-2.5 (3) MG/3ML nebulizer solution 3 mL (3 mLs Nebulization Given 07/24/23 1352)  iohexol (OMNIPAQUE) 350 MG/ML injection 75 mL (75 mLs Intravenous Contrast Given 07/24/23 1345)  predniSONE (DELTASONE) tablet 60 mg (60 mg Oral Given 07/24/23  1532)    ED Course/ Medical Decision Making/ A&P             HEART Score: 3                    Medical Decision Making Amount and/or Complexity of Data Reviewed Labs: ordered. Radiology: ordered.  Risk Prescription drug management.   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  Vital signs reviewed.  Exam he is well-appearing.  Small right-sided chest pain, exertional and somewhat atypical.  EKG without signs of acute ischemia or arrhythmia.  Hear score of 3, will obtain delta troponin for evaluation of ACS.  I am concern for PE given recent travel.  Will obtain CTA chest.  Low suspicion for dissection based on symptoms.  He does report history of asthma, will trial breathing treatment although no wheezing on exam.   Patient placed on continuous vitals and telemetry monitoring while in ED which was reviewed periodically.  Reviewed and confirmed nursing documentation for past medical history, family history, social history.  Reassessment and Plan:   On reassessment patient feels slightly better after DuoNeb.  Initial troponin here is 4, repeat is pending.  He reports some dysuria, added on urinalysis.  Labs notable for K of 3.4.  CTA chest without PE or signs of pneumonia.  On repeat exam he still has some soreness.  It is somewhat reproducible somewhat worse with movement could be musculoskeletal.  Will plan for delta troponin.  Patient reports course of steroids in case there is an asthma component which think is reasonable.  We discussed having him follow-up with cardiology care provider assuming repeat troponin is negative.  Handout given to Dr. Wallace Cullens at 3:30 PM for follow-up urinalysis and repeat troponin.   Patient's presentation is most consistent with acute complicated illness / injury requiring diagnostic workup.           Final Clinical Impression(s) / ED Diagnoses Final diagnoses:   Chest pain, unspecified type    Rx / DC Orders ED Discharge Orders          Ordered    predniSONE (DELTASONE) 20 MG tablet  Daily        07/24/23 1525  Laurence Spates, MD 07/24/23 [phone]

## 2023-07-25 ENCOUNTER — Other Ambulatory Visit (HOSPITAL_BASED_OUTPATIENT_CLINIC_OR_DEPARTMENT_OTHER): Payer: Self-pay

## 2023-07-28 ENCOUNTER — Ambulatory Visit (INDEPENDENT_AMBULATORY_CARE_PROVIDER_SITE_OTHER): Payer: Commercial Managed Care - PPO | Admitting: Neurology

## 2023-07-28 ENCOUNTER — Ambulatory Visit: Payer: Commercial Managed Care - PPO | Admitting: Allergy and Immunology

## 2023-07-28 ENCOUNTER — Encounter: Payer: Self-pay | Admitting: Neurology

## 2023-07-28 ENCOUNTER — Other Ambulatory Visit (HOSPITAL_BASED_OUTPATIENT_CLINIC_OR_DEPARTMENT_OTHER): Payer: Self-pay

## 2023-07-28 VITALS — BP 128/78 | HR 68 | Temp 97.8°F | Resp 16 | Ht 69.0 in | Wt 222.0 lb

## 2023-07-28 VITALS — BP 123/72 | HR 78 | Ht 70.0 in | Wt 223.6 lb

## 2023-07-28 DIAGNOSIS — T7800XD Anaphylactic reaction due to unspecified food, subsequent encounter: Secondary | ICD-10-CM | POA: Diagnosis not present

## 2023-07-28 DIAGNOSIS — K219 Gastro-esophageal reflux disease without esophagitis: Secondary | ICD-10-CM | POA: Diagnosis not present

## 2023-07-28 DIAGNOSIS — Z9989 Dependence on other enabling machines and devices: Secondary | ICD-10-CM | POA: Diagnosis not present

## 2023-07-28 DIAGNOSIS — J3089 Other allergic rhinitis: Secondary | ICD-10-CM

## 2023-07-28 DIAGNOSIS — G4733 Obstructive sleep apnea (adult) (pediatric): Secondary | ICD-10-CM | POA: Diagnosis not present

## 2023-07-28 DIAGNOSIS — J4540 Moderate persistent asthma, uncomplicated: Secondary | ICD-10-CM

## 2023-07-28 DIAGNOSIS — T7800XA Anaphylactic reaction due to unspecified food, initial encounter: Secondary | ICD-10-CM

## 2023-07-28 MED ORDER — BREZTRI AEROSPHERE 160-9-4.8 MCG/ACT IN AERO
2.0000 | INHALATION_SPRAY | Freq: Two times a day (BID) | RESPIRATORY_TRACT | 5 refills | Status: DC
  Filled 2023-07-28: qty 10.7, 30d supply, fill #0
  Filled 2023-08-26: qty 10.7, 30d supply, fill #1

## 2023-07-28 MED ORDER — FAMOTIDINE 40 MG PO TABS
40.0000 mg | ORAL_TABLET | Freq: Every evening | ORAL | 5 refills | Status: DC
  Filled 2023-07-28: qty 30, 30d supply, fill #0
  Filled 2023-08-27: qty 30, 30d supply, fill #1

## 2023-07-28 MED ORDER — ALBUTEROL SULFATE HFA 108 (90 BASE) MCG/ACT IN AERS
2.0000 | INHALATION_SPRAY | Freq: Four times a day (QID) | RESPIRATORY_TRACT | 1 refills | Status: DC | PRN
  Filled 2023-07-28: qty 6.7, 25d supply, fill #0

## 2023-07-28 MED ORDER — PANTOPRAZOLE SODIUM 40 MG PO TBEC
40.0000 mg | DELAYED_RELEASE_TABLET | Freq: Two times a day (BID) | ORAL | 1 refills | Status: DC
  Filled 2023-07-28 – 2023-10-02 (×2): qty 180, 90d supply, fill #0
  Filled 2023-12-28: qty 180, 90d supply, fill #1

## 2023-07-28 NOTE — Progress Notes (Signed)
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  I last saw him in this clinic 30 May 2023.  He states that he had to go to the emergency room for he woke up with shortness of breath and chest tightness and apparently his heart checked out okay and there was possibly secondary to some type of muscle strain he has been given prednisone which has helped that issue.  He finds that he has lots of coughing and throat clearing after eating.  He is making an effort to decrease his caffeine consumption.  He has had very little issues with his upper airway.  He has not smoked any cigarettes in 1 week.  His CPAP is going quite well at this point and he believes that is working well for sleep apnea.  Allergies as of 07/28/2023       Reactions   Demerol [meperidine Hcl]    Difficulty voiding   Food    Mammals- Alpha Gal Syndrome   Latex    REACTION: SOB, rash, itching   Morphine And Codeine    Difficulty voiding        Medication List    albuterol 108 (90 Base) MCG/ACT inhaler Commonly known as: VENTOLIN HFA Inhale 2 puffs into the lungs every 6 (six) hours as needed for wheezing or shortness of breath.   ALPRAZolam 0.5 MG tablet Commonly known as: XANAX Take 1 tablet (0.5 mg total) by mouth 3 (three) times daily as needed.   Breztri Aerosphere 160-9-4.8 MCG/ACT Aero Generic drug: Budeson-Glycopyrrol-Formoterol Inhale 2 puffs into the lungs 2 (two) times daily.   budesonide-formoterol 80-4.5 MCG/ACT inhaler Commonly known as:  SYMBICORT Inhale 2 puffs into the lungs 2 (two) times daily.   EPINEPHrine 0.3 mg/0.3 mL Soaj injection Commonly known as: EpiPen 2-Pak Inject 0.3 mg into the muscle as needed for anaphylaxis.   fexofenadine 60 MG tablet Commonly known as: ALLEGRA Take 60 mg by mouth 2 (two) times daily.   FLUoxetine 10 MG capsule Commonly known as: PROZAC Take 1 capsule (10 mg total) by mouth daily.   metroNIDAZOLE 0.75 % cream Commonly known as: METROCREAM Apply topically 2 (two) times daily to face.   pantoprazole 40 MG tablet Commonly known as: PROTONIX Take 1 tablet (40 mg total) by mouth 2 (two) times daily.   predniSONE 20 MG tablet Commonly known as: DELTASONE Take 2 tablets (40 mg total) by mouth daily for 5 days.   valACYclovir 1000 MG tablet Commonly known as: VALTREX Take 1 tablet by mouth daily.    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 08/2009    Review of systems negative except as noted in HPI / PMHx or noted below:  Review of Systems  Constitutional: Negative.   HENT: Negative.    Eyes: Negative.   Respiratory: Negative.    Cardiovascular: Negative.   Gastrointestinal:  Negative.   Genitourinary: Negative.   Musculoskeletal: Negative.   Skin: Negative.   Neurological: Negative.   Endo/Heme/Allergies: Negative.   Psychiatric/Behavioral: Negative.       Objective:   Vitals:   07/28/23 1049  BP: 128/78  Pulse: 68  Resp: 16  Temp: 97.8 F (36.6 C)  SpO2: 95%   Height: 5\' 9"  (175.3 cm)  Weight: 222 lb (100.7 kg)   Physical Exam Constitutional:      Appearance: He is not diaphoretic.  HENT:     Head: Normocephalic.     Right Ear: Tympanic membrane, ear canal and external ear normal.     Left Ear: Tympanic membrane, ear canal and external ear normal.     Nose: Nose normal. No mucosal edema or rhinorrhea.     Mouth/Throat:     Pharynx: Uvula midline. No oropharyngeal exudate.  Eyes:     Conjunctiva/sclera: Conjunctivae normal.  Neck:     Thyroid: No thyromegaly.     Trachea: Trachea normal. No tracheal tenderness or tracheal deviation.  Cardiovascular:     Rate and Rhythm: Normal rate and regular rhythm.     Heart sounds: Normal heart sounds, S1 normal and S2 normal. No murmur heard. Pulmonary:     Effort: No respiratory distress.     Breath sounds: Normal breath sounds. No stridor. No wheezing or rales.  Lymphadenopathy:     Head:     Right side of head: No tonsillar adenopathy.     Left side of head: No tonsillar adenopathy.     Cervical: No cervical adenopathy.  Skin:    Findings: No erythema or rash.     Nails: There is no clubbing.  Neurological:     Mental Status: He is alert.     Diagnostics: Spirometry was performed and demonstrated an FEV1 of 3.53 at 110 % of predicted.  Results of a Chest CT scan angio obtained 24 July 2023 identified the following:  Cardiovascular: Satisfactory opacification of the pulmonary arteries to the segmental level. No evidence of pulmonary embolism. Normal heart size. No pericardial effusion.   Mediastinum/Nodes: No enlarged mediastinal, hilar, or axillary lymph nodes. Thyroid gland,  trachea, and esophagus demonstrate no significant findings.   Lungs/Pleura: Unchanged, definitively benign nodule of the medial right upper lobe for which no further follow-up or characterization is required (series 7, image 63). No pleural effusion or pneumothorax.  Assessment and Plan:   1. Not well controlled moderate persistent asthma   2. Other allergic rhinitis   3. LPRD (laryngopharyngeal reflux disease)   4. Allergy with anaphylaxis due to food     1. Treat and prevent inflammation of airway:   A. Breztri - 2 inhalations 2 times per day w/ spacer (empty lungs)  B. Flonase -  2 sprays each nostril 1 time per day  2. Treat and prevent reflux induced inflammation of airway:   A. Pantoprazole 40 mg - 2 times per day  B. START Famotidine 40 mg - 1 tablet in evening  C. Always aim to decrease caffeine consumption as much as possible  D. No large late meals  E. Further evaluation with endoscopy???  3. Prevent smoke and particle induced inflammation of airway:   A. Have non-smokable nicotine available as replacement for smoke  B. Mask and cross current ventilation with woodworking  4. If needed:   A. Airsupra or Symbicort 80 or albuterol - 2 inhalations every 6 hours  B. Fexofenadine  C. Epi-Pen  5. Blood - area 2 profile, alpha-gal panel  6. 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  He will remain on anti-inflammatory agents for his airway.  He has very bad reflux and we will start him on famotidine in addition to his proton pump inhibitor.  We will further define the extent of his alpha gal and also look at his aeroallergen hypersensitivity profile with the blood test noted above.  I will contact him with the results of those blood test once  they are available for review.  Laurette Schimke, MD Allergy / Immunology [facility]

## 2023-07-28 NOTE — Patient Instructions (Addendum)
  1. Treat and prevent inflammation of airway:   A. Breztri - 2 inhalations 2 times per day w/ spacer (empty lungs)  B. Flonase - 2 sprays each nostril 1 time per day  2. Treat and prevent reflux induced inflammation of airway:   A. Pantoprazole 40 mg - 2 times per day  B. START Famotidine 40 mg - 1 tablet in evening  C. Always aim to decrease caffeine consumption as much as possible  D. No large late meals  E. Further evaluation with endoscopy???  3. Prevent smoke and particle induced inflammation of airway:   A. Have non-smokable nicotine available as replacement for smoke  B. Mask and cross current ventilation with woodworking  4. If needed:   A. Airsupra or Symbicort 80 or albuterol - 2 inhalations every 6 hours  B. Fexofenadine  C. Epi-Pen  5. Blood - area 2 profile, alpha-gal panel  6. Return to clinic December 2024 or earlier if problem

## 2023-07-28 NOTE — Progress Notes (Signed)
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  Chief concern according to patient :  I love the new machine - I love the new mask, the quiet and nasal pillows dream wear model.  High compliance: Daniel Lam has used the machine for the last 87 days over 92% by compliance of days and over 75% by compliance of hours.  I have to add that he is on-call that he has to sometimes interrupt his sleep and that he has traveled for 10 or 12 days to [place] this year and did not bring the machine with him.  So his average daily use is 6 hours 17 minutes on days used.  The machine is set between 5 and 14 cm water pressure with 3 cm expiratory pressure relief.  Residual AHI is 2.0, 95th percentile pressure is just under 10 cm water and well covered within the current parameters.  Air leak is very low and the new mask fits him well and he likes it the 95th percentile air leak is 1.8 L a minute which is again very very low.  The home sleep test that preceded the order for new CPAP was from 03-15-2023 and the patient was seen in referral on 02-24-2023 from Daniel Lam.  He had undergone his first sleep study in the year 2014 with a home sleep test under Dr. Mellody Dance clams.  CPAP was set at 13 cm water in the past.  He did have a high air leak.  The Epworth sleepiness score was 5 out of 24 on CPAP and today the Epworth sleepiness score is still 5 out of 24 the fatigue severity score is very low at 14 out of 63 points and the patient did not endorse depression.  I will see Daniel Lam once a year.          Review of Systems: Out of a complete 14 system review, the patient complains of only the following symptoms, and all other reviewed systems are negative.:     How likely are you to doze in the following situations: 0 = not likely, 1 = slight chance, 2 = moderate chance, 3 = high chance   Sitting and Reading? Watching Television? Sitting inactive in a public place (theater or meeting)? As a passenger in a car for an hour without a break? Lying down in the afternoon when circumstances permit? Sitting and talking to someone? Sitting quietly after lunch without alcohol? In a car, while stopped for a few minutes in traffic?   Total = 5/ 24 points   FSS endorsed at 14/ 63 points.   Social History   Socioeconomic History   Marital status: Married    Spouse name: Not on file   Number of children: 3   Years of education: MD   Highest education level: Not on file  Occupational History   Occupation: physician  Comment: internal medicine  Tobacco Use   Smoking status: Some Days    Current packs/day: 0.25    Types: Cigarettes, Cigars   Smokeless tobacco: Never   Tobacco comments:    Occasional cigar  Vaping Use   Vaping status: Never Used  Substance and Sexual Activity   Alcohol use: Yes    Alcohol/week: 0.0 standard drinks of alcohol    Comment: occ   Drug use: No   Sexual activity: Yes    Partners: Female  Other Topics Concern   Not on file  Social History Narrative   Regular exercise: yes, very active   Caffeine use: 2 cups of coffee and soda's daily.   Right-handed.   Social Determinants of Health   Financial Resource Strain: Not on file  Food Insecurity: Not on file  Transportation Needs: Not on file  Physical Activity: Not on file  Stress: Not on file  Social Connections: Not on file    Family History  Problem Relation Age of Onset   Sjogren's syndrome Mother    COPD Mother    Colon polyps Father    Dementia Father    Throat cancer Father     Melanoma Brother    Colon cancer Neg Hx    Esophageal cancer Neg Hx    Stomach cancer Neg Hx    Rectal cancer Neg Hx    Liver disease Neg Hx     Past Medical History:  Diagnosis Date   Anxiety state, unspecified    Atypical mole 2008   Complication of anesthesia    had some difficulty voiding   Dermatitis    anal dermatitis   Hemorrhoids    Herpes simplex without mention of complication    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 08/2009     Current Outpatient Medications on File Prior to Visit  Medication Sig Dispense Refill   albuterol (VENTOLIN HFA) 108 (90 Base) MCG/ACT inhaler Inhale 2 puffs into the lungs every 6 (six) hours as needed for wheezing or shortness of breath. 6.7 g 12   ALPRAZolam (XANAX) 0.5 MG tablet Take 1 tablet (0.5 mg total) by mouth 3 (three) times daily as needed. 270 tablet 0   Budeson-Glycopyrrol-Formoterol (BREZTRI AEROSPHERE) 160-9-4.8 MCG/ACT AERO Inhale 2 puffs with spacer into the lungs 2 (two) times daily to prevent cough or wheezing. Rinse, gargle, and spit after use. 10.7 g 1   budesonide-formoterol (SYMBICORT) 80-4.5 MCG/ACT inhaler Inhale 2 puffs into the lungs 2 (two) times daily. 30.6 g 3   EPINEPHrine (EPIPEN 2-PAK) 0.3 mg/0.3 mL IJ SOAJ injection Inject 0.3 mg into the muscle as needed for anaphylaxis. 2 each 3   fexofenadine (ALLEGRA) 60 MG tablet Take 60 mg by mouth 2 (two) times daily.      FLUoxetine (PROZAC) 10 MG capsule Take 1 capsule (10 mg total) by mouth daily. 90 capsule 1  metroNIDAZOLE (METROCREAM) 0.75 % cream Apply topically 2 (two) times daily to face. 45 g 3   pantoprazole (PROTONIX) 40 MG tablet Take 1 tablet (40 mg total) by mouth 2 (two) times daily. 180 tablet 1   predniSONE (DELTASONE) 20 MG tablet Take 2 tablets (40 mg total) by mouth daily for 5 days. 10 tablet 0   valACYclovir (VALTREX) 1000 MG tablet Take 1 tablet by mouth daily.     No current facility-administered medications on file prior to visit.    Allergies  Allergen Reactions   Demerol [Meperidine Hcl]     Difficulty voiding   Food     Mammals- Alpha Gal Syndrome   Latex     REACTION: SOB, rash, itching   Morphine And Codeine     Difficulty voiding     DIAGNOSTIC DATA (LABS, IMAGING, TESTING) - I reviewed patient records, labs, notes, testing and imaging myself where available.  Lab Results  Component Value Date   WBC 7.8 07/24/2023   HGB 14.5 07/24/2023   HCT 41.7 07/24/2023   MCV 94.3 07/24/2023   PLT 220 07/24/2023      Component Value Date/Time   NA 139 07/24/2023 1302   NA 137 01/06/2023 0000   K 3.4 (L) 07/24/2023 1302   CL 102 07/24/2023 1302   CO2 30 07/24/2023 1302   GLUCOSE 100 (H) 07/24/2023 1302   BUN 20 07/24/2023 1302   BUN 15 01/06/2023 0000   CREATININE 1.05 07/24/2023 1302   CREATININE 1.01 07/16/2019 1228   CALCIUM 9.6 07/24/2023 1302   PROT 6.9 11/15/2022 1204   PROT 6.7 01/07/2021 0740   ALBUMIN 4.4 01/06/2023 0000   ALBUMIN 4.2 01/07/2021 0740   AST 29 01/06/2023 0000   ALT 39 01/06/2023 0000   ALKPHOS 76 01/06/2023 0000   BILITOT 0.5 11/15/2022 1204   BILITOT 0.5 01/07/2021 0740   GFRNONAA >60 07/24/2023 1302   GFRNONAA 83 07/16/2019 1228   GFRAA 96 07/16/2019 1228   Lab Results  Component Value Date   CHOL 197 01/06/2023   HDL 62 01/06/2023   LDLCALC 118 01/06/2023   LDLDIRECT 119.0 11/27/2015   TRIG 94 01/06/2023   CHOLHDL 3  11/15/2022   Lab Results  Component Value Date   HGBA1C 5.6 01/06/2023   Lab Results  Component Value Date   VITAMINB12 256 11/15/2022   Lab Results  Component Value Date   TSH 1.32 01/06/2023    PHYSICAL EXAM:  Today's Vitals   07/28/23 0819  BP: 123/72  Pulse: 78  Weight: 223 lb 9.6 oz (101.4 kg)  Height: 5\' 10"  (1.778 m)   Body mass index is 32.08 kg/m.   Wt Readings from Last 3 Encounters:  07/28/23 223 lb 9.6 oz (101.4 kg)  06/30/23 221 lb (100.2 kg)  02/24/23 220 lb (99.8 kg)     Ht Readings from Last 3 Encounters:  07/28/23 5\' 10"  (1.778 m)  06/30/23 5\' 9"  (1.753 m)  02/24/23 5\' 10"  (1.778 m)      General: The patient is awake, alert and appears not in acute distress. The patient is well groomed. Head: Normocephalic, atraumatic. Neck is supple.  Mallampati 1- elongated and deeply red uvula-   neck circumference:17 inches . Nasal airflow little bit congested- all year around -mostly patent.  Retrognathia is not seen.  Dental status: biological , Facial hair.  Cardiovascular:  Regular rate and cardiac rhythm by pulse,  without distended neck veins. Respiratory: Lungs are clear to auscultation.  Skin:  With evidence of ankle edema, or rash. Trunk: The patient's posture is erect.   NEUROLOGIC EXAM: The patient is awake and alert, oriented to place and time.   Memory subjective described as intact.  Attention span & concentration ability appears normal.  Speech is fluent,  without  dysarthria, dysphonia or aphasia.  Mood and affect are appropriate.   Cranial nerves: no loss of smell or taste reported  Pupils are equal and briskly reactive to light. Funduscopic exam deferred.  Extraocular movements in vertical and horizontal planes were intact and without nystagmus. No Diplopia. Visual fields by finger perimetry are intact. Hearing was intact to soft voice and finger rubbing.    Facial sensation intact to fine touch.  Facial motor strength is symmetric  and tongue and uvula move midline.  Neck ROM : rotation, tilt and flexion extension were normal for age and shoulder shrug was symmetrical.    Motor exam:  Symmetric bulk, tone and ROM.   Normal tone without cog wheeling, symmetric grip strength .   Sensory:  Fine touch, pinprick and vibration :  normal.  Proprioception tested in the upper extremities was normal.   Coordination: Rapid alternating movements in the fingers/hands were of normal speed.  The Finger-to-nose maneuver was intact without evidence of ataxia, dysmetria or tremor.    ASSESSMENT AND PLAN 62 y.o. year old male  here with:    1) new CPAP and OSA is well controlled.   Rv with me in 12 months.   I would like to thank Zola Button, Grayling Congress, DO and [address] [place],  [place] [address] for allowing me to meet with and to take care of this pleasant patient.    After spending a total time of  21  minutes face to face and additional time for physical and neurologic examination, review of laboratory studies,  personal review of imaging studies, reports and results of other testing and review of referral information / records as far as provided in visit,   Electronically signed by: Melvyn Novas, MD 07/28/2023 8:40 AM  [facility] Board certified by The [employer] of Sleep Medicine and Diplomate of the [employer] of Sleep Medicine. Board certified In Neurology through the ABPN, Fellow of the [employer] of Neu[employer].

## 2023-07-29 ENCOUNTER — Other Ambulatory Visit (INDEPENDENT_AMBULATORY_CARE_PROVIDER_SITE_OTHER): Payer: Commercial Managed Care - PPO

## 2023-07-29 DIAGNOSIS — T7800XA Anaphylactic reaction due to unspecified food, initial encounter: Secondary | ICD-10-CM

## 2023-07-29 DIAGNOSIS — G4733 Obstructive sleep apnea (adult) (pediatric): Secondary | ICD-10-CM | POA: Diagnosis not present

## 2023-08-01 ENCOUNTER — Encounter: Payer: Self-pay | Admitting: Allergy and Immunology

## 2023-08-02 LAB — ALLERGENS W/TOTAL IGE AREA 2
Aspergillus Fumigatus IgE: 0.1 kU/L — AB
Cockroach, German IgE: 0.22 kU/L — AB
D Farinae IgE: 0.62 kU/L — AB
D Pteronyssinus IgE: 0.34 kU/L — AB

## 2023-08-02 LAB — ALPHA-GAL PANEL
Allergen Lamb IgE: 0.59 kU/L — AB
Beef IgE: 1.71 kU/L — AB
IgE (Immunoglobulin E), Serum: 308 [IU]/mL (ref 6–495)
O215-IgE Alpha-Gal: 2.31 kU/L — AB
Pork IgE: 0.66 kU/L — AB

## 2023-08-05 ENCOUNTER — Encounter: Payer: Self-pay | Admitting: Allergy and Immunology

## 2023-08-08 ENCOUNTER — Encounter: Payer: Self-pay | Admitting: Allergy and Immunology

## 2023-08-09 DIAGNOSIS — G4733 Obstructive sleep apnea (adult) (pediatric): Secondary | ICD-10-CM | POA: Diagnosis not present

## 2023-08-12 ENCOUNTER — Encounter: Payer: Self-pay | Admitting: Internal Medicine

## 2023-08-19 NOTE — Progress Notes (Unsigned)
Referring Physician:  Zola Lam, Daniel Congress, DO [address] [place],  [place] [address]  Primary Physician:  Daniel Schultz, DO  History of Present Illness: 08/19/2023 [name] is here today with a chief complaint of ***  Low back pain with right buttock pain, numbness and weakness in right foot   Duration: ***? Location: *** Quality: Weakness on right side?  Severity: *** 10/10 Precipitating: aggravated by Sitting and changing positions Modifying factors: made better by standing Weakness: none Timing: constant?  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  The symptoms are causing a significant impact on the patient's life.   I have utilized the care everywhere function in epic to review the outside records available from external health systems.  Review of Systems:  A 10 point review of systems is negative, except for the pertinent positives and negatives detailed in the HPI.  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 08/2009    Allergies: Allergies as of 08/25/2023 - Review Complete 08/01/2023  Allergen Reaction Noted   Demerol [meperidine hcl]  08/27/2011   Food  08/22/2018   Latex     Morphine and codeine  08/27/2011    Medications:  Current Outpatient Medications:    albuterol (VENTOLIN HFA) 108 (90 Base) MCG/ACT inhaler, Inhale 2 puffs into the lungs every 6 (six) hours as needed for wheezing or shortness of breath., Disp: 6.7 g, Rfl: 1   ALPRAZolam (XANAX) 0.5 MG tablet, Take 1 tablet (0.5 mg total) by mouth 3 (three) times daily as needed., Disp: 270 tablet, Rfl: 0   Budeson-Glycopyrrol-Formoterol (BREZTRI AEROSPHERE) 160-9-4.8 MCG/ACT AERO, Inhale 2 puffs into the lungs 2 (two) times daily., Disp: 10.7 g, Rfl: 5   budesonide-formoterol (SYMBICORT) 80-4.5 MCG/ACT inhaler, Inhale 2 puffs into the lungs 2 (two) times daily., Disp: 30.6 g, Rfl: 3   EPINEPHrine (EPIPEN 2-PAK) 0.3 mg/0.3 mL IJ SOAJ injection, Inject 0.3 mg into the muscle as needed for anaphylaxis., Disp: 2 each, Rfl: 3   famotidine (PEPCID) 40 MG tablet, Take 1 tablet (40 mg total) by mouth every evening., Disp: 30 tablet, Rfl: 5  fexofenadine (ALLEGRA) 60 MG tablet, Take 60 mg by mouth 2 (two) times daily., Disp: , Rfl:    FLUoxetine (PROZAC) 10 MG capsule, Take 1 capsule (10 mg total) by mouth daily., Disp: 90 capsule, Rfl: 1   metroNIDAZOLE (METROCREAM) 0.75 % cream, Apply topically 2 (two) times daily to face., Disp: 45 g, Rfl: 3   pantoprazole (PROTONIX) 40 MG tablet, Take 1 tablet (40 mg total) by mouth 2 (two) times daily., Disp: 180 tablet, Rfl: 1   valACYclovir (VALTREX) 1000 MG tablet, Take 1 tablet by mouth daily., Disp: , Rfl:   Social History: Social History    Tobacco Use   Smoking status: Some Days    Current packs/day: 0.25    Types: Cigarettes, Cigars   Smokeless tobacco: Never   Tobacco comments:    Occasional cigar  Vaping Use   Vaping status: Never Used  Substance Use Topics   Alcohol use: Yes    Alcohol/week: 0.0 standard drinks of alcohol    Comment: occ   Drug use: No    Family Medical History: Family History  Problem Relation Age of Onset   Sjogren's syndrome Mother    COPD Mother    Colon polyps Father    Dementia Father    Throat cancer Father    Melanoma Brother    Colon cancer Neg Hx    Esophageal cancer Neg Hx    Stomach cancer Neg Hx    Rectal cancer Neg Hx    Liver disease Neg Hx     Physical Examination: There were no vitals filed for this visit.  General: Patient is in no apparent distress. Attention to examination is appropriate.  Neck:   Supple.  Full range of motion.  Respiratory: Patient is breathing without any difficulty.   NEUROLOGICAL:     Awake, alert, oriented to person, place, and time.  Speech is clear and fluent.   Cranial Nerves: Pupils equal round and reactive to light.  Facial tone is symmetric.  Facial sensation is symmetric. Shoulder shrug is symmetric. Tongue protrusion is midline.  There is no pronator drift.  Strength: Side Biceps Triceps Deltoid Interossei Grip Wrist Ext. Wrist Flex.  R 5 5 5 5 5 5 5   L 5 5 5 5 5 5 5    Side Iliopsoas Quads Hamstring PF DF EHL  R 5 5 5 5 5 5   L 5 5 5 5 5 5    Reflexes are ***2+ and symmetric at the biceps, triceps, brachioradialis, patella and achilles.   Hoffman's is absent.   Bilateral upper and lower extremity sensation is intact to light touch.    No evidence of dysmetria noted.  Gait is normal.     Medical Decision Making  Imaging: ***  I have personally reviewed the images and agree with the above interpretation.  Assessment and Plan: [name] is a pleasant 62 y.o. male with ***    Thank you for involving me in the  care of this patient.      Daniel Lam K. Daniel Haff MD, [facility] Neurosurgery .

## 2023-08-23 ENCOUNTER — Other Ambulatory Visit: Payer: Self-pay

## 2023-08-23 DIAGNOSIS — K219 Gastro-esophageal reflux disease without esophagitis: Secondary | ICD-10-CM

## 2023-08-25 ENCOUNTER — Ambulatory Visit: Payer: Commercial Managed Care - PPO | Admitting: Neurosurgery

## 2023-08-25 ENCOUNTER — Encounter: Payer: Self-pay | Admitting: Neurosurgery

## 2023-08-25 VITALS — BP 112/72 | Ht 69.0 in | Wt 222.0 lb

## 2023-08-25 DIAGNOSIS — R29898 Other symptoms and signs involving the musculoskeletal system: Secondary | ICD-10-CM

## 2023-08-25 DIAGNOSIS — M5416 Radiculopathy, lumbar region: Secondary | ICD-10-CM | POA: Diagnosis not present

## 2023-08-26 ENCOUNTER — Other Ambulatory Visit (HOSPITAL_BASED_OUTPATIENT_CLINIC_OR_DEPARTMENT_OTHER): Payer: Self-pay

## 2023-08-26 ENCOUNTER — Other Ambulatory Visit: Payer: Self-pay

## 2023-08-26 ENCOUNTER — Other Ambulatory Visit: Payer: Self-pay | Admitting: Family Medicine

## 2023-08-26 ENCOUNTER — Other Ambulatory Visit: Payer: Self-pay | Admitting: Psychiatry

## 2023-08-26 DIAGNOSIS — M5416 Radiculopathy, lumbar region: Secondary | ICD-10-CM

## 2023-08-26 DIAGNOSIS — F411 Generalized anxiety disorder: Secondary | ICD-10-CM

## 2023-08-26 DIAGNOSIS — R29898 Other symptoms and signs involving the musculoskeletal system: Secondary | ICD-10-CM

## 2023-08-26 NOTE — Telephone Encounter (Signed)
I refilled for the year, bc he is a yearly pt. Is that appropriate?

## 2023-08-27 ENCOUNTER — Ambulatory Visit (HOSPITAL_COMMUNITY): Payer: Commercial Managed Care - PPO

## 2023-08-27 ENCOUNTER — Ambulatory Visit (INDEPENDENT_AMBULATORY_CARE_PROVIDER_SITE_OTHER): Payer: Commercial Managed Care - PPO

## 2023-08-27 DIAGNOSIS — M79605 Pain in left leg: Secondary | ICD-10-CM | POA: Diagnosis not present

## 2023-08-27 DIAGNOSIS — M5127 Other intervertebral disc displacement, lumbosacral region: Secondary | ICD-10-CM | POA: Diagnosis not present

## 2023-08-27 DIAGNOSIS — M450 Ankylosing spondylitis of multiple sites in spine: Secondary | ICD-10-CM | POA: Diagnosis not present

## 2023-08-27 DIAGNOSIS — R531 Weakness: Secondary | ICD-10-CM

## 2023-08-27 DIAGNOSIS — M79662 Pain in left lower leg: Secondary | ICD-10-CM | POA: Diagnosis not present

## 2023-08-27 DIAGNOSIS — M48061 Spinal stenosis, lumbar region without neurogenic claudication: Secondary | ICD-10-CM | POA: Diagnosis not present

## 2023-08-27 DIAGNOSIS — M47816 Spondylosis without myelopathy or radiculopathy, lumbar region: Secondary | ICD-10-CM | POA: Diagnosis not present

## 2023-08-29 ENCOUNTER — Encounter: Payer: Self-pay | Admitting: Neurosurgery

## 2023-08-29 ENCOUNTER — Other Ambulatory Visit (HOSPITAL_BASED_OUTPATIENT_CLINIC_OR_DEPARTMENT_OTHER): Payer: Self-pay

## 2023-08-29 DIAGNOSIS — G4733 Obstructive sleep apnea (adult) (pediatric): Secondary | ICD-10-CM | POA: Diagnosis not present

## 2023-08-30 ENCOUNTER — Encounter: Payer: Self-pay | Admitting: Internal Medicine

## 2023-09-01 ENCOUNTER — Ambulatory Visit: Payer: Commercial Managed Care - PPO | Admitting: Internal Medicine

## 2023-09-01 ENCOUNTER — Encounter: Payer: Self-pay | Admitting: Internal Medicine

## 2023-09-01 VITALS — BP 113/70 | HR 65 | Temp 97.7°F | Resp 11 | Ht 70.0 in | Wt 220.0 lb

## 2023-09-01 DIAGNOSIS — G4733 Obstructive sleep apnea (adult) (pediatric): Secondary | ICD-10-CM | POA: Diagnosis not present

## 2023-09-01 DIAGNOSIS — R1319 Other dysphagia: Secondary | ICD-10-CM | POA: Diagnosis not present

## 2023-09-01 DIAGNOSIS — K219 Gastro-esophageal reflux disease without esophagitis: Secondary | ICD-10-CM | POA: Diagnosis not present

## 2023-09-01 MED ORDER — SODIUM CHLORIDE 0.9 % IV SOLN: 500.0000 mL | INTRAVENOUS | Status: DC

## 2023-09-01 NOTE — Progress Notes (Signed)
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  He decided to wean himself off of medication.  Subsequently developed significant recurrent reflux with nocturnal symptoms and reactive airway symptoms.  Resumed pantoprazole once daily, then twice daily.  Pepcid at night.  Treated with inhalers as needed.  Now reports being 80% back to baseline.  Also reports some proximal pill dysphagia and dysphagia with liquids.  No solid food dysphagia.  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 He has never used smokeless tobacco. He reports current alcohol use. He reports that he does not use drugs.  family history includes COPD in his mother; Colon polyps in his father; Dementia in his father; Melanoma in his brother; Sjogren's syndrome in his mother; Throat cancer in his father.  Allergies  Allergen Reactions    Demerol [Meperidine Hcl]     Difficulty voiding   Food     Mammals- Alpha Gal Syndrome   Latex     REACTION: SOB, rash, itching   Morphine And Codeine     Difficulty voiding   Other Other (See Comments)    Mammals- Alpha Gal Syndrome       PHYSICAL EXAMINATION: Vital signs: BP 120/70   Pulse 64   Temp 97.7 F (36.5 C)   Resp 11   Ht 5\' 10"  (1.778 m)   Wt 220 lb (99.8 kg)   SpO2 97%   BMI 31.57 kg/m  General: Well-developed, well-nourished, no acute distress HEENT: Sclerae are anicteric, conjunctiva pink. Oral mucosa intact Lungs: Clear Heart: Regular Abdomen: soft, nontender, nondistended, no obvious ascites, no peritoneal signs, normal bowel sounds. No organomegaly. Extremities: No edema Psychiatric: alert and oriented x3. Cooperative     ASSESSMENT:  GERD. Dysphagia Reactive airway disease   PLAN:  1.  Upper endoscopy with possible esophageal dilation 2.  Continue twice daily PPI until baseline.  Then return to once daily therapy

## 2023-09-01 NOTE — Progress Notes (Signed)
Report to PACU, RN, vss, BBS= Clear.  

## 2023-09-01 NOTE — Op Note (Signed)
[facility] Patient Name: Daniel Lam Procedure Date: 09/01/2023 9:07 AM MRN: [id number] Endoscopist: Wilhemina Bonito. Marina Goodell , MD, [id number] Age: 62 Referring MD:  Date of Birth: 07-05-1961 Gender: Male Account #: [id number] Procedure:                Upper GI endoscopy Indications:              Dysphagia, Esophageal reflux, reactive airway.                            Symptoms occurred after weaning off PPI Medicines:                Monitored Anesthesia Care Procedure:                Pre-Anesthesia Assessment:                           - Prior to the procedure, a History and Physical                            was performed, and patient medications and                            allergies were reviewed. The patient's tolerance of                            previous anesthesia was also reviewed. The risks                            and benefits of the procedure and the sedation                            options and risks were discussed with the patient.                            All questions were answered, and informed consent                            was obtained. Prior Anticoagulants: The patient has                            taken no anticoagulant or antiplatelet agents. ASA                            Grade Assessment: II - A patient with mild systemic                            disease. After reviewing the risks and benefits,                            the patient was deemed in satisfactory condition to                            undergo the procedure.  After obtaining informed consent, the endoscope was                            passed under direct vision. Throughout the                            procedure, the patient's blood pressure, pulse, and                            oxygen saturations were monitored continuously. The                            GIF [id number] #[id number] was introduced through the                            mouth, and advanced to the  second part of duodenum.                            The upper GI endoscopy was accomplished without                            difficulty. The patient tolerated the procedure                            well. Scope In: Scope Out: Findings:                 The esophagus was normal.                           The stomach was normal.                           The examined duodenum was normal.                           The cardia and gastric fundus were normal on                            retroflexion. Complications:            No immediate complications. Estimated Blood Loss:     Estimated blood loss: none. Impression:               - Normal esophagus.                           - Normal stomach.                           - Normal examined duodenum.                           - No specimens collected. Recommendation:           - Patient has a contact number available for  emergencies. The signs and symptoms of potential                            delayed complications were discussed with the                            patient. Return to normal activities tomorrow.                            Written discharge instructions were provided to the                            patient.                           - Resume previous diet.                           - Continue present medications.                           - Coontinue BID PPI until you return to baseline.                            Then, return to once daily therapy                           - Call me for any questions or problems. Wilhemina Bonito. Marina Goodell, MD 09/01/2023 9:44:26 AM This report has been signed electronically.

## 2023-09-01 NOTE — Patient Instructions (Addendum)
Resume previous diet Continue present medications   YOU HAD AN ENDOSCOPIC PROCEDURE TODAY AT THE [facility]:   Refer to the procedure report that was given to you for any specific questions about what was found during the examination.  If the procedure report does not answer your questions, please call your gastroenterologist to clarify.  If you requested that your care partner not be given the details of your procedure findings, then the procedure report has been included in a sealed envelope for you to review at your convenience later.  YOU SHOULD EXPECT: Some feelings of bloating in the abdomen. Passage of more gas than usual.  Walking can help get rid of the air that was put into your GI tract during the procedure and reduce the bloating. If you had a lower endoscopy (such as a colonoscopy or flexible sigmoidoscopy) you may notice spotting of blood in your stool or on the toilet paper. If you underwent a bowel prep for your procedure, you may not have a normal bowel movement for a few days.  Please Note:  You might notice some irritation and congestion in your nose or some drainage.  This is from the oxygen used during your procedure.  There is no need for concern and it should clear up in a day or so.  SYMPTOMS TO REPORT IMMEDIATELY:  Following upper endoscopy (EGD)  Vomiting of blood or coffee ground material  New chest pain or pain under the shoulder blades  Painful or persistently difficult swallowing  New shortness of breath  Fever of 100F or higher  Black, tarry-looking stools  For urgent or emergent issues, a gastroenterologist can be reached at any hour by calling [phone]. Do not use MyChart messaging for urgent concerns.   DIET:  We do recommend a small meal at first, but then you may proceed to your regular diet.  Drink plenty of fluids but you should avoid alcoholic beverages for 24 hours.  ACTIVITY:  You should plan to take it easy for the rest of today  and you should NOT DRIVE or use heavy machinery until tomorrow (because of the sedation medicines used during the test).    FOLLOW UP: Our staff will call the number listed on your records the next business day following your procedure.  We will call around 7:15- 8:00 am to check on you and address any questions or concerns that you may have regarding the information given to you following your procedure. If we do not reach you, we will leave a message.     SIGNATURES/CONFIDENTIALITY: You and/or your care partner have signed paperwork which will be entered into your electronic medical record.  These signatures attest to the fact that that the information above on your After Visit Summary has been reviewed and is understood.  Full responsibility of the confidentiality of this discharge information lies with you and/or your care-partner.

## 2023-09-02 ENCOUNTER — Telehealth: Payer: Self-pay

## 2023-09-02 DIAGNOSIS — M5416 Radiculopathy, lumbar region: Secondary | ICD-10-CM | POA: Diagnosis not present

## 2023-09-02 NOTE — Telephone Encounter (Signed)
  Follow up Call-     09/01/2023    7:54 AM  Call back number  Post procedure Call Back phone  # [phone]  Permission to leave phone message Yes     Patient questions:  Do you have a fever, pain , or abdominal swelling? No. Pain Score  0 *  Have you tolerated food without any problems? Yes.    Have you been able to return to your normal activities? Yes.    Do you have any questions about your discharge instructions: Diet   No. Medications  No. Follow up visit  No.  Do you have questions or concerns about your Care? No.  Actions: * If pain score is 4 or above: No action needed, pain <4.

## 2023-09-09 DIAGNOSIS — G4733 Obstructive sleep apnea (adult) (pediatric): Secondary | ICD-10-CM | POA: Diagnosis not present

## 2023-09-14 ENCOUNTER — Other Ambulatory Visit (HOSPITAL_BASED_OUTPATIENT_CLINIC_OR_DEPARTMENT_OTHER): Payer: Self-pay

## 2023-09-15 ENCOUNTER — Ambulatory Visit: Payer: Commercial Managed Care - PPO | Admitting: Internal Medicine

## 2023-09-17 ENCOUNTER — Other Ambulatory Visit: Payer: Commercial Managed Care - PPO

## 2023-09-20 DIAGNOSIS — M5416 Radiculopathy, lumbar region: Secondary | ICD-10-CM | POA: Diagnosis not present

## 2023-09-28 DIAGNOSIS — G4733 Obstructive sleep apnea (adult) (pediatric): Secondary | ICD-10-CM | POA: Diagnosis not present

## 2023-09-29 ENCOUNTER — Encounter: Payer: Self-pay | Admitting: Psychiatry

## 2023-09-29 ENCOUNTER — Ambulatory Visit (INDEPENDENT_AMBULATORY_CARE_PROVIDER_SITE_OTHER): Payer: Commercial Managed Care - PPO | Admitting: Psychiatry

## 2023-09-29 ENCOUNTER — Other Ambulatory Visit (HOSPITAL_BASED_OUTPATIENT_CLINIC_OR_DEPARTMENT_OTHER): Payer: Self-pay

## 2023-09-29 DIAGNOSIS — F411 Generalized anxiety disorder: Secondary | ICD-10-CM | POA: Diagnosis not present

## 2023-09-29 DIAGNOSIS — F5105 Insomnia due to other mental disorder: Secondary | ICD-10-CM

## 2023-09-29 MED ORDER — FLUOXETINE HCL 20 MG PO CAPS
20.0000 mg | ORAL_CAPSULE | Freq: Every day | ORAL | 1 refills | Status: DC
  Filled 2023-09-29: qty 90, 90d supply, fill #0
  Filled 2023-12-28: qty 90, 90d supply, fill #1

## 2023-09-29 NOTE — Progress Notes (Signed)
Daniel Lam, [place] [id number] 1961-10-04 62 y.o.  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  Last seen a year ago without med changes..  10/08/21 appt noted: Challenges this year 100 yo son declared himself transgender.  Reacted the best I can. Never really tried Wellbutrin bc he didn't want to bother with more pills.  He might consider it.   OK with depression and anxiety.  One of the things that helped was more acceptance.  Happy being a doctor.   No sig changes.  Increased xanax a little over time.  Needed more Xanax to sleep 0.75 mg HS.  Sleep OK with that.  Anxiety is manageable.  Mood and otherwise are the same. Relational stress. Doesn't think meds will fix this.  Use CPAP.   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  Not able to do that.  09/23/22  appt noted: Good overall.  Going regularly to see Patty VonSteen therapist.  Has been very helpful.  Journaling.  Has been helpful.  Liberating in a way. Overall things are good.  Will slow down in practice and transition over the next 5 years and work less and do things more.   Has felt ok with meds so hasn't changed anything. Plans to cut hours next year.   09/29/23 appt noted: Psych med: alprazolam 0.5 mg TID prn, fluoxetine 20 mg daily. Tried buspar but really dizzy on it for 4-5 hours and didn't tolerate it but tried for weeks. Was off fluoxetine end August.   Had some physical stresses including back pain and asthma, bad reflux interfered with sleep.  Got him really anxious and almost panic. Anxiety got worse so back on fluoxetine 20 mg daily.  Good benefit now.  Doesn't want to make med changes Needs surgery for back.   I'm burned out with lack of independence.    Past Psychiatric Medication Trials: Trintellix SE,  Fluoxetine,  sertraline, citalopram, paroxetine, venlafaxine constipation,  Buspirone dizzy Xanax  Review of Systems:  Review of Systems  Respiratory:  Positive for shortness of breath.   Cardiovascular:  Negative for chest pain and palpitations.  Musculoskeletal:  Positive for back pain.  Neurological:  Negative for tremors.  Psychiatric/Behavioral:  Negative for dysphoric mood.     Medications: I have reviewed the patient's current medications.  Current Outpatient Medications  Medication Sig Dispense Refill   albuterol (VENTOLIN HFA) 108 (90 Base) MCG/ACT inhaler Inhale 2 puffs into the lungs every 6 (six) hours as needed for wheezing or shortness of breath. 6.7 g 1   ALPRAZolam (XANAX) 0.5 MG tablet Take 1 tablet (0.5 mg total) by mouth 3 (three) times daily as needed. 270 tablet 0   Budeson-Glycopyrrol-Formoterol (BREZTRI AEROSPHERE) 160-9-4.8 MCG/ACT AERO Inhale 2 puffs into the lungs 2 (two) times daily. 10.7 g 5   budesonide-formoterol (SYMBICORT) 80-4.5 MCG/ACT inhaler Inhale 2 puffs into the lungs 2 (two) times daily. 30.6 g 3   EPINEPHrine (EPIPEN 2-PAK) 0.3 mg/0.3 mL IJ SOAJ injection Inject 0.3 mg into the muscle as needed for anaphylaxis. 2 each 3   famotidine (PEPCID) 40 MG tablet Take 1 tablet (40 mg total) by mouth every evening. 30 tablet 5   fexofenadine (ALLEGRA) 60 MG tablet Take 60 mg by mouth  2 (two) times daily.     pantoprazole (PROTONIX) 40 MG tablet Take 1 tablet (40 mg total) by mouth 2 (two) times daily. 180 tablet 1   valACYclovir (VALTREX) 1000 MG tablet Take 1 tablet by mouth daily.     FLUoxetine (PROZAC) 20 MG capsule Take 1 capsule (20 mg total) by mouth daily. 90 capsule 1   No current facility-administered medications for this visit.    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   Numbness and tingling    Other and unspecified anterior pituitary hyperfunction    Sleep apnea    Unspecified asthma(493.90)    controlled    Family History  Problem Relation Age of Onset   Sjogren's syndrome Mother    COPD Mother    Colon polyps Father    Dementia Father    Throat cancer Father    Melanoma Brother    Colon cancer Neg Hx    Esophageal cancer Neg Hx    Stomach cancer Neg Hx    Rectal cancer Neg Hx    Liver disease Neg Hx     Social History   Socioeconomic History   Marital status: Married    Spouse name: Not on file   Number of children: 3   Years of education: MD   Highest education level: Not on file  Occupational History   Occupation: physician    Comment: internal medicine  Tobacco Use   Smoking status: Some Days    Current packs/day: 0.25    Types: Cigarettes, Cigars   Smokeless tobacco: Never   Tobacco comments:    Occasional cigar  Vaping Use   Vaping status: Never Used  Substance and Sexual Activity   Alcohol use: Yes    Alcohol/week: 0.0 standard drinks of alcohol    Comment: occ   Drug use: No   Sexual activity: Yes    Partners: Female  Other Topics Concern   Not on file  Social History Narrative   Regular exercise: yes, very active   Caffeine use: 2 cups of coffee and soda's daily.   Right-handed.   Social Drivers of Corporate investment banker Strain: Not on file  Food Insecurity: Not on file  Transportation Needs: Not on file  Physical Activity: Not on file  Stress: Not on file   Social Connections: Not on file  Intimate Partner Violence: Not on file    Past Medical History, Surgical history, Social history, and Family history were reviewed and updated as appropriate.   Please see review of systems for further details on the patient's review from today.   Objective:   Physical Exam:  There were no vitals taken for this visit.  Physical Exam  Constitutional:      General: He is not in acute distress.    Appearance: He is well-developed.  Musculoskeletal:        General: No deformity.  Neurological:     Mental Status: He is alert and oriented to person, place, and time.     Coordination: Coordination normal.  Psychiatric:        Attention and Perception: Attention and perception normal. He does not perceive auditory or visual hallucinations.        Mood and Affect: Mood normal. Mood is not anxious or depressed. Affect is not labile or angry.        Speech: Speech normal.        Behavior: Behavior normal. Behavior is not slowed.        Thought Content: Thought content normal. Thought content is not delusional. Thought content does not include homicidal or suicidal ideation. Thought content does not include suicidal plan.        Cognition and Memory: Cognition and memory normal.        Judgment: Judgment normal.     Comments: Insight intact. No delusions.      Lab Review:     Component Value Date/Time   NA 139 07/24/2023 1302   NA 137 01/06/2023 0000   K 3.4 (L) 07/24/2023 1302   CL 102 07/24/2023 1302   CO2 30 07/24/2023 1302   GLUCOSE 100 (H) 07/24/2023 1302   BUN 20 07/24/2023 1302   BUN 15 01/06/2023 0000   CREATININE 1.05 07/24/2023 1302   CREATININE 1.01 07/16/2019 1228   CALCIUM 9.6 07/24/2023 1302   PROT 6.9 11/15/2022 1204   PROT 6.7 01/07/2021 0740   ALBUMIN 4.4 01/06/2023 0000   ALBUMIN 4.2 01/07/2021 0740   AST 29 01/06/2023 0000   ALT 39 01/06/2023 0000   ALKPHOS 76 01/06/2023 0000   BILITOT 0.5 11/15/2022 1204   BILITOT 0.5  01/07/2021 0740   GFRNONAA >60 07/24/2023 1302   GFRNONAA 83 07/16/2019 1228   GFRAA 96 07/16/2019 1228       Component Value Date/Time   WBC 7.8 07/24/2023 1302   RBC 4.42 07/24/2023 1302   HGB 14.5 07/24/2023 1302   HGB 14.3 01/07/2021 0740   HCT 41.7 07/24/2023 1302   HCT 41.4 01/07/2021 0740   PLT 220 07/24/2023 1302   PLT 186 01/07/2021 0740   MCV 94.3 07/24/2023 1302   MCV 97 01/07/2021 0740   MCH 32.8 07/24/2023 1302   MCHC 34.8 07/24/2023 1302   RDW 11.9 07/24/2023 1302   RDW 12.1 01/07/2021 0740   LYMPHSABS 1.3 11/15/2022 1204   LYMPHSABS 1.1 01/07/2021 0740   MONOABS 0.5 11/15/2022 1204   EOSABS 0.1 11/15/2022 1204   EOSABS 0.1 01/07/2021 0740   BASOSABS 0.0 11/15/2022 1204   BASOSABS 0.0 01/07/2021 0740    No results found for: "POCLITH", "LITHIUM"   No results found for: "PHENYTOIN", "PHENOBARB", "VALPROATE", "CBMZ"   .res Assessment: Plan:    Shriyansh was seen today for follow-up, depression and anxiety.  Diagnoses and all orders for this visit:  Generalized anxiety disorder -     FLUoxetine (PROZAC) 20 MG capsule; Take 1 capsule (20 mg total) by mouth daily.  Insomnia due to mental condition    Disc option of counseling again.  Supportive therapy dealing with family stressors. Not depressed but disenchanted.  Alternative Viibryd with less sexual SE or Auvelity Option switch duloxetine for pain.    No buspar DT SE  Continue  fluoxetine 20 mg daily until spring  for anxiety and mood and Xanax 0.75 mg for sleep.  No Xanax daytime.  FU  6 mos  Meredith Staggers, MD, DFAPA    Please see After Visit Summary for patient specific instructions.  Future Appointments  Date Time Provider Department Center  10/11/2023  3:45 PM Venetia Night, MD [facility] None  08/02/2024  9:30 AM Dohmeier, Porfirio Mylar, MD [facility] None     No orders of the defined types were placed in this encounter.   -------------------------------

## 2023-10-03 ENCOUNTER — Other Ambulatory Visit (HOSPITAL_BASED_OUTPATIENT_CLINIC_OR_DEPARTMENT_OTHER): Payer: Self-pay

## 2023-10-04 ENCOUNTER — Other Ambulatory Visit (HOSPITAL_BASED_OUTPATIENT_CLINIC_OR_DEPARTMENT_OTHER): Payer: Self-pay

## 2023-10-09 DIAGNOSIS — G4733 Obstructive sleep apnea (adult) (pediatric): Secondary | ICD-10-CM | POA: Diagnosis not present

## 2023-10-11 ENCOUNTER — Ambulatory Visit (INDEPENDENT_AMBULATORY_CARE_PROVIDER_SITE_OTHER): Payer: Commercial Managed Care - PPO | Admitting: Neurosurgery

## 2023-10-11 DIAGNOSIS — M5416 Radiculopathy, lumbar region: Secondary | ICD-10-CM

## 2023-10-11 NOTE — Progress Notes (Signed)
 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  He has been doing some exercises, but not started physical therapy.  He has had 1 injection which did help.  He has another injection scheduled.    08/25/2023 Mr. Yianni Skilling is here today with a chief complaint of intermittent low back pain that began in December 2023.  He went on a hike in December and had onset of back pain.  It subsequently improved, but he had an additional episode that began in April 2024.  Since that time, he also began having pain into his left buttock and numbness and weakness in his left foot.  His numbness on the bottom of the foot and he has some pain around his ankle.  His pain can get as bad as 7 out of 10 with walking or standing.  Sitting or laying down makes it improved.  He feels the pain almost immediately when he stands.  He has pain within walking 50 feet.  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  The symptoms are causing a significant impact on the patient's life.   I have utilized the care everywhere function in epic to review the outside records available from external health systems.  Review of Systems:  A 10 point review of systems is negative, except for the pertinent positives and negatives detailed in the HPI.  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 08/2009    Allergies: Allergies as of 10/11/2023 - Review Complete 09/29/2023  Allergen Reaction Noted   Demerol [meperidine hcl]  08/27/2011   Food  08/22/2018   Latex     Morphine and codeine  08/27/2011   Other Other (See Comments) 08/22/2018    Medications:  Current Outpatient Medications:    albuterol  (VENTOLIN  HFA) 108 (90 Base) MCG/ACT inhaler, Inhale 2 puffs into the lungs every 6 (six) hours as needed for wheezing or shortness of breath., Disp: 6.7 g, Rfl:  1   ALPRAZolam  (XANAX ) 0.5 MG tablet, Take 1 tablet (0.5 mg total) by mouth 3 (three) times daily as needed., Disp: 270 tablet, Rfl: 0   Budeson-Glycopyrrol-Formoterol  (BREZTRI  AEROSPHERE) 160-9-4.8 MCG/ACT AERO, Inhale 2 puffs into the lungs 2 (two) times daily., Disp: 10.7 g, Rfl: 5   budesonide -formoterol  (SYMBICORT ) 80-4.5 MCG/ACT inhaler, Inhale 2 puffs into the lungs 2 (two) times daily., Disp: 30.6 g, Rfl: 3   EPINEPHrine  (EPIPEN  2-PAK) 0.3 mg/0.3 mL IJ SOAJ injection, Inject 0.3 mg into the muscle as needed for anaphylaxis., Disp: 2 each, Rfl: 3   famotidine  (PEPCID ) 40 MG tablet, Take 1 tablet (40 mg total) by mouth every evening., Disp: 30 tablet, Rfl: 5    fexofenadine (ALLEGRA) 60 MG tablet, Take 60 mg by mouth 2 (two) times daily., Disp: , Rfl:    FLUoxetine  (PROZAC ) 20 MG capsule, Take 1 capsule (20 mg total) by mouth daily., Disp: 90 capsule, Rfl: 1   pantoprazole  (PROTONIX ) 40 MG tablet, Take 1 tablet (40 mg total) by mouth 2 (two) times daily., Disp: 180 tablet, Rfl: 1   valACYclovir  (VALTREX ) 1000 MG tablet, Take 1 tablet by mouth daily., Disp: , Rfl:   Social History: Social History   Tobacco Use   Smoking status: Some Days    Current packs/day: 0.25    Types: Cigarettes, Cigars   Smokeless tobacco: Never   Tobacco comments:    Occasional cigar  Vaping Use   Vaping status: Never Used  Substance Use Topics   Alcohol use: Yes    Alcohol/week: 0.0 standard drinks of alcohol    Comment: occ   Drug use: No    Family Medical History: Family History  Problem Relation Age of Onset   Sjogren's syndrome Mother    COPD Mother    Colon polyps Father    Dementia Father    Throat cancer Father    Melanoma Brother    Colon cancer Neg Hx    Esophageal cancer Neg Hx    Stomach cancer Neg Hx    Rectal cancer Neg Hx    Liver disease Neg Hx     Physical Examination: There were no vitals filed for this visit.     Medical Decision Making  Imaging: MRI L spine 08/27/2023 IMPRESSION: 1. Interval resolution of the previously seen right subarticular disc extrusion at L5-S1. Unchanged severe left and moderate right neural foraminal narrowing at this level. 2. Moderate bilateral neural foraminal narrowing at L4-5, slightly worse on the right. 3. Moderate right neural foraminal narrowing at L3-4.     Electronically Signed   By: Ryan Chess M.D.   On: 08/31/2023 09:43    I placed a representative axial and sagittal slice which shows the approach 1 would need for a far lateral foraminotomy at L5-S1 on the left side.  I have personally reviewed the images and agree with the above interpretation.  Assessment and  Plan: Mr. Smola is a pleasant 62 y.o. male with symptoms concerning for a left-sided L5 or S1 radiculopathy.  I think this is likely due to his foraminal compression of the left L5 nerve root.  He is scheduled for second injection.  Of asked him to touch base with me after that injections performed.  I described to him that I do not think that left L5-S1 far lateral foraminotomy is possible without destabilization, as his posterior superior iliac spine extends medially to such an extent that the lateral to medial approach of a far lateral foraminotomy would  not be feasible.  As such, the primary surgical option for decompression of his friend would be either a transforaminal lumbar interbody fusion or an anterior lumbar interbody fusion.  I would usually favor a transforaminal approach given the potential risk of sexual dysfunction with an anterior approach.  Will discuss this more after his next injection is performed.   This visit was performed via telephone.  Patient location: home Provider location: office  I spent a total of 4 minutes non-face-to-face activities for this visit on the date of this encounter including review of current clinical condition and response to treatment.  The patient is aware of and accepts the limits of this telehealth visit. Thank you for involving me in the care of this patient.      Shyquan Stallbaumer K. Clois MD, [facility] Neurosurgery .

## 2023-10-20 ENCOUNTER — Other Ambulatory Visit: Payer: Self-pay

## 2023-10-20 ENCOUNTER — Other Ambulatory Visit (HOSPITAL_BASED_OUTPATIENT_CLINIC_OR_DEPARTMENT_OTHER): Payer: Self-pay

## 2023-10-20 MED ORDER — VALACYCLOVIR HCL 1 G PO TABS
1000.0000 mg | ORAL_TABLET | Freq: Every day | ORAL | 0 refills | Status: DC
  Filled 2023-10-20: qty 90, 90d supply, fill #0

## 2023-10-21 DIAGNOSIS — M5416 Radiculopathy, lumbar region: Secondary | ICD-10-CM | POA: Diagnosis not present

## 2023-10-29 DIAGNOSIS — G4733 Obstructive sleep apnea (adult) (pediatric): Secondary | ICD-10-CM | POA: Diagnosis not present

## 2023-11-02 ENCOUNTER — Ambulatory Visit: Payer: Commercial Managed Care - PPO | Admitting: Internal Medicine

## 2023-11-10 ENCOUNTER — Telehealth: Payer: Self-pay | Admitting: Neurosurgery

## 2023-11-10 ENCOUNTER — Encounter: Payer: Self-pay | Admitting: Neurosurgery

## 2023-11-10 ENCOUNTER — Other Ambulatory Visit (HOSPITAL_BASED_OUTPATIENT_CLINIC_OR_DEPARTMENT_OTHER): Payer: Self-pay

## 2023-11-10 ENCOUNTER — Ambulatory Visit: Payer: Commercial Managed Care - PPO | Admitting: Neurosurgery

## 2023-11-10 VITALS — BP 136/78

## 2023-11-10 DIAGNOSIS — M5416 Radiculopathy, lumbar region: Secondary | ICD-10-CM | POA: Diagnosis not present

## 2023-11-10 DIAGNOSIS — G8929 Other chronic pain: Secondary | ICD-10-CM

## 2023-11-10 DIAGNOSIS — M5441 Lumbago with sciatica, right side: Secondary | ICD-10-CM | POA: Diagnosis not present

## 2023-11-10 DIAGNOSIS — M5442 Lumbago with sciatica, left side: Secondary | ICD-10-CM | POA: Diagnosis not present

## 2023-11-10 DIAGNOSIS — M4310 Spondylolisthesis, site unspecified: Secondary | ICD-10-CM

## 2023-11-10 MED ORDER — DIAZEPAM 5 MG PO TABS
5.0000 mg | ORAL_TABLET | Freq: Three times a day (TID) | ORAL | 0 refills | Status: AC | PRN
  Filled 2023-11-10: qty 21, 7d supply, fill #0

## 2023-11-10 MED ORDER — METHOCARBAMOL 500 MG PO TABS
500.0000 mg | ORAL_TABLET | Freq: Four times a day (QID) | ORAL | 0 refills | Status: DC | PRN
  Filled 2023-11-10: qty 120, 30d supply, fill #0

## 2023-11-10 MED ORDER — METHYLPREDNISOLONE 4 MG PO TBPK
ORAL_TABLET | ORAL | 0 refills | Status: DC
  Filled 2023-11-10: qty 21, 6d supply, fill #0

## 2023-11-10 NOTE — Telephone Encounter (Signed)
Patient calling if you can work him in this afternoon. He is having serve back pain since Sunday and can barely walk. He is scheduled for an injection this morning but would like to see you this afternoon.

## 2023-11-10 NOTE — Progress Notes (Signed)
Referring Physician:  No referring provider defined for this encounter.  Primary Physician:  Donato Schultz, DO  History of Present Illness: 11/10/2023 Daniel Lam is here today with a chief complaint of back pain and R sided pain.  He began having symptoms on the R a few days ago.  His pain peaked last night.  He did not have paresthesias. He had mostly R sided pain into his buttock.  He had trouble walking yesterday.    This is his 4th episode of pain in the past year.  Each episode lasts a few weeks, then he has an interval of reasonable pain for a couple of weeks before he has another episode of pain.    He had a L-sided injection at L5/S1 today.   He had an injection on the left side a few weeks ago without much impact to his symptoms.    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  He has been doing some exercises, but not started physical therapy.  He has had 1 injection which did help.  He has another injection scheduled.       08/25/2023 Daniel Lam is here today with a chief complaint of intermittent low back pain that began in December 2023.  He went on a hike in December and had onset of back pain.  It subsequently improved, but he had an additional episode that began in April 2024.  Since that time, he also began having pain into his left buttock and numbness and weakness in his left foot.  His numbness on the bottom of the foot and he has some pain around his ankle.  His pain can get as bad as 7 out of 10 with walking or standing.  Sitting or laying down makes it improved.  He feels the pain almost immediately when he stands.  He has pain within walking 50 feet.   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   The symptoms are causing a significant impact on the patient's life.    I have utilized the care everywhere function in epic to review the outside records available from external health systems.    Review of Systems:  A 10 point review of systems is negative, except for the pertinent positives and negatives detailed in the HPI.  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 08/2009    Allergies: Allergies as of 11/10/2023 - Review Complete 11/10/2023  Allergen Reaction Noted   Demerol [meperidine hcl]  08/27/2011   Food  08/22/2018   Latex     Morphine and codeine  08/27/2011   Other Other (See Comments) 08/22/2018    Medications:  Current Outpatient Medications:    albuterol (VENTOLIN HFA) 108 (90 Base) MCG/ACT inhaler, Inhale 2 puffs into the lungs every 6 (six) hours as needed for wheezing or shortness of breath., Disp: 6.7 g, Rfl: 1   ALPRAZolam (XANAX) 0.5 MG tablet, Take 1 tablet (0.5 mg total) by mouth 3 (three)  times daily as needed., Disp: 270 tablet, Rfl: 0   budesonide-formoterol (SYMBICORT) 80-4.5 MCG/ACT inhaler, Inhale 2 puffs into the lungs 2 (two) times daily., Disp: 30.6 g, Rfl: 3   EPINEPHrine (EPIPEN 2-PAK) 0.3 mg/0.3 mL IJ SOAJ injection, Inject 0.3 mg into the muscle as needed for anaphylaxis., Disp: 2 each, Rfl: 3   famotidine (PEPCID) 40 MG tablet, Take 1 tablet (40 mg total) by mouth every evening., Disp: 30 tablet, Rfl: 5   fexofenadine (ALLEGRA) 60 MG tablet, Take 60 mg by mouth 2 (two) times daily., Disp: , Rfl:    FLUoxetine (PROZAC) 20 MG capsule, Take 1 capsule (20 mg total) by mouth daily., Disp: 90 capsule, Rfl: 1   pantoprazole (PROTONIX) 40 MG tablet, Take 1 tablet (40 mg total) by mouth 2 (two) times daily., Disp: 180 tablet, Rfl: 1   valACYclovir (VALTREX) 1000 MG tablet, Take 1 tablet (1,000 mg total) by mouth daily., Disp: 90 tablet, Rfl: 0   Budeson-Glycopyrrol-Formoterol (BREZTRI AEROSPHERE) 160-9-4.8 MCG/ACT AERO, Inhale 2 puffs into the lungs 2 (two) times daily. (Patient not taking: Reported on 11/10/2023), Disp: 10.7 g, Rfl: 5  Social History: Social History   Tobacco Use   Smoking status: Some Days    Current packs/day: 0.25    Types: Cigarettes, Cigars   Smokeless tobacco: Never   Tobacco comments:    Occasional cigar  Vaping Use   Vaping status: Never Used  Substance Use Topics   Alcohol use: Yes    Alcohol/week: 0.0 standard drinks of alcohol    Comment: occ   Drug use: No    Family Medical History: Family History  Problem Relation Age of Onset   Sjogren's syndrome Mother    COPD Mother    Colon polyps Father    Dementia Father    Throat cancer Father    Melanoma Brother    Colon cancer Neg Hx    Esophageal cancer Neg Hx    Stomach cancer Neg Hx    Rectal cancer Neg Hx    Liver disease Neg Hx     Physical Examination: Vitals:   11/10/23 1550  BP: 136/78    General: Patient is in no apparent distress. Attention to examination is  appropriate.  Neck:   Supple.  Full range of motion.  Respiratory: Patient is breathing without any difficulty.   NEUROLOGICAL:     Awake, alert, oriented to person, place, and time.  Speech is clear and fluent.   Cranial Nerves: grossly intact  In obvious pain in lower back  Strength: MAEW, grossly full   Reflexes are 1+ and symmetric at the  biceps, triceps, brachioradialis, patella and achilles.   Hoffman's is absent.   Gait is abnormal.     Medical Decision Making  Imaging: MRI L spine 08/27/2023 IMPRESSION: 1. Interval resolution of the previously seen right subarticular disc extrusion at L5-S1. Unchanged severe left and moderate right neural foraminal narrowing at this level. 2. Moderate bilateral neural foraminal narrowing at L4-5, slightly worse on the right. 3. Moderate right neural foraminal narrowing at L3-4.     Electronically Signed   By: Orvan Falconer M.D.   On: 08/31/2023 09:43    I have personally reviewed the images and agree with the above interpretation.  Assessment and Plan: Mr. Grob is a pleasant 63 y.o. male with severe exacerbation of back pain with right-sided sciatica.  He has previously had multiple episodes of left-sided sciatica and right sided sciatica.  Over the past year, his episodes have being longer in his asymptomatic intervals and been getting shorter.  He tried physical therapy without substantial long-term improvement.  He has had multiple injections.  I have recommended muscle relaxants, steroid taper, and short course of Valium to help with what seems to be a severe muscle spasm.  After his steroid taper finishes, he can begin taking naproxen or ibuprofen as indicated to help with his musculoskeletal pain.  At this point, he has tried and failed conservative management extensively.  We discussed that he has bilateral L5 compression which I think is symptomatic.  I would like to get flexion-extension radiographs of his lower  back.  Because of his retrolisthesis and the position of his posterior superior iliac crest, far lateral foraminotomy at L5-S1 is not possible.  As such, I would recommend L5-S1 transforaminal lumbar interbody fusion.  We discussed that he would like to talk with a nonoperative provider.  I will reach out to Dr. Wynn Banker for this.  Additionally, he may elect to have a second opinion.  I provided several different suggestions.  Once he decides whom he would like to see, I would be happy to facilitate.    Thank you for involving me in the care of this patient.      Laprecious Austill K. Myer Haff MD, [facility]

## 2023-11-10 NOTE — Telephone Encounter (Signed)
He confirmed 4pm today

## 2023-11-11 ENCOUNTER — Ambulatory Visit
Admission: RE | Admit: 2023-11-11 | Discharge: 2023-11-11 | Disposition: A | Discharge status: Home / Self Care | Payer: Commercial Managed Care - PPO | Source: Ambulatory Visit | Attending: Neurosurgery | Admitting: Neurosurgery

## 2023-11-11 DIAGNOSIS — M5416 Radiculopathy, lumbar region: Secondary | ICD-10-CM

## 2023-11-11 DIAGNOSIS — G8929 Other chronic pain: Secondary | ICD-10-CM

## 2023-11-11 DIAGNOSIS — M4310 Spondylolisthesis, site unspecified: Secondary | ICD-10-CM

## 2023-11-14 ENCOUNTER — Encounter: Payer: Self-pay | Admitting: Physical Medicine & Rehabilitation

## 2023-11-14 ENCOUNTER — Other Ambulatory Visit: Payer: Self-pay | Admitting: Neurosurgery

## 2023-11-14 DIAGNOSIS — M5416 Radiculopathy, lumbar region: Secondary | ICD-10-CM

## 2023-11-15 ENCOUNTER — Encounter: Payer: Self-pay | Admitting: Neurosurgery

## 2023-11-16 ENCOUNTER — Ambulatory Visit: Payer: Commercial Managed Care - PPO | Admitting: Internal Medicine

## 2023-11-24 ENCOUNTER — Other Ambulatory Visit (HOSPITAL_BASED_OUTPATIENT_CLINIC_OR_DEPARTMENT_OTHER): Payer: Self-pay

## 2023-11-24 ENCOUNTER — Ambulatory Visit: Payer: Commercial Managed Care - PPO | Admitting: Allergy and Immunology

## 2023-11-24 ENCOUNTER — Encounter: Payer: Self-pay | Admitting: Allergy and Immunology

## 2023-11-24 ENCOUNTER — Encounter: Payer: Self-pay | Admitting: Physical Medicine & Rehabilitation

## 2023-11-24 ENCOUNTER — Other Ambulatory Visit: Payer: Self-pay

## 2023-11-24 ENCOUNTER — Encounter
Payer: Commercial Managed Care - PPO | Attending: Physical Medicine & Rehabilitation | Admitting: Physical Medicine & Rehabilitation

## 2023-11-24 VITALS — BP 118/76 | HR 61 | Resp 16

## 2023-11-24 VITALS — BP 125/81 | HR 68 | Ht 70.0 in | Wt 216.0 lb

## 2023-11-24 DIAGNOSIS — M48062 Spinal stenosis, lumbar region with neurogenic claudication: Secondary | ICD-10-CM | POA: Diagnosis not present

## 2023-11-24 DIAGNOSIS — K219 Gastro-esophageal reflux disease without esophagitis: Secondary | ICD-10-CM

## 2023-11-24 DIAGNOSIS — J4540 Moderate persistent asthma, uncomplicated: Secondary | ICD-10-CM

## 2023-11-24 DIAGNOSIS — Z7722 Contact with and (suspected) exposure to environmental tobacco smoke (acute) (chronic): Secondary | ICD-10-CM

## 2023-11-24 DIAGNOSIS — T7800XA Anaphylactic reaction due to unspecified food, initial encounter: Secondary | ICD-10-CM

## 2023-11-24 DIAGNOSIS — T7800XD Anaphylactic reaction due to unspecified food, subsequent encounter: Secondary | ICD-10-CM

## 2023-11-24 MED ORDER — BUDESONIDE-FORMOTEROL FUMARATE 80-4.5 MCG/ACT IN AERO
2.0000 | INHALATION_SPRAY | Freq: Two times a day (BID) | RESPIRATORY_TRACT | 5 refills | Status: AC
  Filled 2023-11-24: qty 30.6, 90d supply, fill #0
  Filled 2024-03-17: qty 30.6, 90d supply, fill #1
  Filled 2024-10-11: qty 30.6, 90d supply, fill #2

## 2023-11-24 MED ORDER — AIRSUPRA 90-80 MCG/ACT IN AERO
2.0000 | INHALATION_SPRAY | Freq: Four times a day (QID) | RESPIRATORY_TRACT | 2 refills | Status: AC | PRN
  Filled 2023-11-24: qty 10.7, 30d supply, fill #0
  Filled 2024-02-19: qty 10.7, 30d supply, fill #1

## 2023-11-24 NOTE — Patient Instructions (Addendum)
Recommendations  Bicycling is good for lumbar stenosis because it opens up to the neural foramen.  Would continue core strengthening, try to identify specific exercises that may aggravate your pain.  I would avoid hyperextending the lumbar spine. Would emphasize strengthening of the gluteal muscles with hip extension exercises as well as hip abduction exercises  Do body weight exercises or light weight lifting to maintain upper body strength including therapeutic band exercises for the upper back.  Postoperatively, would anticipate need for additional physical therapy.  Please call to get orders.  Would also need postoperative restrictions prescribed by the surgeon.  I would avoid using a back brace unless it is for a certain amount of time postoperatively and prescribed by your neurosurgeon.  Would also recommend downloading the [employer] fitness app which has yoga exercises that may be helpful for your pain.  Once again would avoid hyperextension of the lumbar area.  Alternatively a yoga trainer may be helpful as well.

## 2023-11-24 NOTE — Progress Notes (Signed)
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  All of our medical notes had been removed from epic EMR as this is a restricted file.  He has been doing well at this point in time regarding his asthma.  He uses Symbicort 2 inhalation twice a day and rarely has to use a short acting bronchodilator.  He does need a refill on Airsupra.  He does not really exercise very much because of a back issue and it sounds as though he is moving forward for possible back fusion.  He has been receiving systemic steroids for his back issue which is one of the reasons for why his asthma may actually be doing a lot better.  He rarely has exposure to tobacco smoke averaging out to 2-3 times per week.  His reflux is under very good control while using his proton pump inhibitor twice a day.  He still continues to consume a fair amount of caffeine in the morning.  He does not consume any mammal.  Allergies as of 11/24/2023       Reactions   Demerol [meperidine Hcl]    Difficulty voiding   Food    Mammals- Alpha Gal Syndrome   Latex    REACTION: SOB, rash, itching   Morphine And Codeine    Difficulty voiding   Other Other (See Comments)   Mammals- Alpha Gal Syndrome        Medication List    albuterol 108 (90 Base) MCG/ACT inhaler Commonly known as: VENTOLIN HFA Inhale 2 puffs into the lungs every 6 (six) hours as needed for wheezing or shortness of breath.   ALPRAZolam 0.5 MG tablet Commonly known as: XANAX Take 1 tablet (0.5 mg total) by mouth 3 (three) times daily as needed.    budesonide-formoterol 80-4.5 MCG/ACT inhaler Commonly known as: SYMBICORT Inhale 2 puffs into the lungs 2 (two) times daily.   EPINEPHrine 0.3 mg/0.3 mL Soaj injection Commonly known as: EpiPen 2-Pak Inject 0.3 mg into the muscle as needed for anaphylaxis.   fexofenadine 60 MG tablet Commonly known as: ALLEGRA Take 60 mg by mouth 2 (two) times daily.   FLUoxetine 20 MG capsule Commonly known as: PROZAC Take 1 capsule (20 mg total) by mouth daily.   methocarbamol 500 MG tablet Commonly known as: ROBAXIN Take 1 tablet (500 mg total) by mouth every 6 (six) hours as needed for muscle spasms.   pantoprazole 40 MG tablet Commonly known as: PROTONIX Take 1 tablet (40 mg total) by mouth 2 (two) times daily.   valACYclovir 1000 MG tablet Commonly known as: VALTREX Take 1 tablet (1,000 mg total) by mouth daily.    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 08/2009    Review of systems negative except as noted in HPI / PMHx or noted below:  Review of Systems  Constitutional: Negative.   HENT: Negative.    Eyes: Negative.   Respiratory: Negative.    Cardiovascular: Negative.    Gastrointestinal: Negative.   Genitourinary: Negative.   Musculoskeletal: Negative.   Skin: Negative.   Neurological: Negative.   Endo/Heme/Allergies: Negative.   Psychiatric/Behavioral: Negative.       Objective:   Vitals:   11/24/23 1148  BP: 118/76  Pulse: 61  Resp: 16  SpO2: 97%          Physical Exam Constitutional:      Appearance: He is not diaphoretic.  HENT:     Head: Normocephalic.     Right Ear: Tympanic membrane, ear canal and external ear normal.     Left Ear: Tympanic membrane, ear canal and external ear normal.     Nose: Nose normal. No mucosal edema or rhinorrhea.     Mouth/Throat:     Pharynx: Uvula midline. No oropharyngeal exudate.  Eyes:     Conjunctiva/sclera: Conjunctivae normal.  Neck:     Thyroid: No thyromegaly.     Trachea: Trachea normal. No tracheal tenderness or tracheal deviation.  Cardiovascular:     Rate and Rhythm: Normal rate and regular rhythm.     Heart sounds: Normal heart sounds, S1 normal and S2 normal. No murmur heard. Pulmonary:     Effort: No respiratory distress.     Breath sounds: Normal breath sounds. No stridor. No wheezing or rales.  Lymphadenopathy:     Head:     Right side of head: No tonsillar adenopathy.     Left side of head: No tonsillar adenopathy.     Cervical: No cervical adenopathy.  Skin:    Findings: No erythema or rash.     Nails: There is no clubbing.  Neurological:     Mental Status: He is alert.     Diagnostics: Spirometry was performed and demonstrated an FEV1 of 3.89 at 121 % of predicted.  Assessment and Plan:   1. Asthma, moderate persistent, well-controlled   2. Gastroesophageal reflux disease without esophagitis   3. Allergy with anaphylaxis due to food   4. Tobacco smoke exposure    1. Continue Symbicort 160 - 2 inhalations 2 times per day (empty lungs)  2. Continue Pantoprazole 40 mg - 1 tablet 2 times per day  3. If needed:   A. Airsupra - 2 inhalations every 6 hours  B.  Epi-Pen  C. Antihistamine   4. Return to clinic in 1 year or earlier if problem  5. 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  I am pretty sure that part of his improvement regarding his airway is related to the fact that he has been  receiving some intermittent pulses of systemic steroids for his back.  Hopefully he can get that back issue straightened out and will not require recurrent boluses of systemic steroids.  I will see him back in this clinic in 1 year or earlier if there is a problem.  Laurette Schimke, MD Allergy / Immunology [facility]

## 2023-11-24 NOTE — Patient Instructions (Addendum)
  1. Continue Symbicort 160 - 2 inhalations 2 times per day (empty lungs)  2. Continue Pantoprazole 40 mg - 1 tablet 2 times per day  3. If needed:   A. Airsupra - 2 inhalations every 6 hours  B. Epi-Pen  C. Antihistamine   4. Return to clinic in 1 year or earlier if problem  5. Always aim to minimize smoke exposure

## 2023-11-24 NOTE — Progress Notes (Signed)
Subjective:    Patient ID: Daniel Plump, MD, male    DOB: [birth year], 63 y.o.   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 Hx 2020 Right sciatica resolved with epidural injection.  MRI at that time demonstrated a right L5-S1 disc herniation.  The patient did well for a number of years staying active with mountain biking and hiking.    In January 2024 the patient developed an episode of severe back pain as well as left lower extremity pain after a 1.5-hour hike.  This episode lasted for 6 weeks.  The patient started doing core strengthening with a personal trainer. Unfortunately in June 2024 after a gym working out and was more vigorous than usual the patient had another episode of increased pain lasting for 6 weeks.  The patient had 2 more episodes in 2024 in August and then November where he had flareups of his back pain as well as left lower extremity pain. He had repeat lumbar MRI noted below.  This demonstrated L5-S1 foraminal stenosis severe on the left side, moderate on the right side. Underwent left L5-S1 transforaminal lumbar epidural steroid injection in December 2024 by Dr. Regino Schultze   Feb 2025 after laying in bed with flu for a couple days the patient developed increased pain and left lower extremity pain.  He underwent another epidural steroid injection with Dr. Regino Schultze at left L4-L5   MRI LUMBAR SPINE WITHOUT CONTRAST   TECHNIQUE: Multiplanar, multisequence MR imaging of the lumbar spine was performed. No intravenous contrast was administered.   COMPARISON:  Lumbar spine MRI 08/27/2019.   FINDINGS: Segmentation: Conventional numbering is assumed with 5 non-rib-bearing, lumbar type vertebral bodies.   Alignment: Mild degenerative reversal of the normal upper lumbar lordosis. Unchanged 3 mm retrolisthesis of L5 on S1   Vertebrae:  No fracture, evidence of discitis, or bone lesion.   Conus  medullaris and cauda equina: Conus extends to the L1 level. Conus and cauda equina appear normal.   Paraspinal and other soft tissues: Unremarkable.   Disc levels:   T12-L1: No disc herniation or spinal canal stenosis. Left-greater-than-right facet arthropathy.   L1-L2: Small disc bulge and mild bilateral facet arthropathy without spinal canal stenosis or neural foraminal narrowing.   L2-L3: Small disc bulge and mild bilateral facet arthropathy. No spinal canal stenosis or neural foraminal narrowing.   L3-L4: Disc bulge and moderate bilateral facet arthropathy results in mild narrowing of the bilateral subarticular zones and moderate right neural foraminal narrowing.   L4-L5: Disc bulge and moderate bilateral facet arthropathy with new medially projecting synovial cyst arising from the right L4-5 facet joint, measuring up to 4 mm without significant mass effect. Moderate bilateral neural foraminal narrowing, slightly worse on the right.   L5-S1: Interval resolution of the previously seen right subarticular disc extrusion. Retrolisthesis and facet arthropathy results in severe left and moderate right neural foraminal narrowing, unchanged.   IMPRESSION: 1. Interval resolution of the previously seen right subarticular disc extrusion at L5-S1. Unchanged severe left and moderate right neural foraminal narrowing at this level. 2. Moderate bilateral neural foraminal narrowing at L4-5, slightly worse on the right. 3. Moderate right neural foraminal narrowing at L3-4.     Electronically Signed   By: Orvan Falconer M.D.   On: 08/31/2023 09:43 Pain Inventory Average Pain 3 Pain Right Now 3 My pain is constant and dull  In the last 24 hours, has pain interfered  with the following? General activity 1 Relation with others 2 Enjoyment of life 3 What TIME of day is your pain at its worst? varies Sleep (in general) Fair  Pain is worse with: walking, bending, sitting, standing,  and some activites Pain improves with: rest and medication Relief from Meds: 8  ability to climb steps?  yes do you drive?  yes Do you have any goals in this area?  yes  employed # of hrs/week Full-time MD Do you have any goals in this area?  yes  numbness tingling spasms  Any changes since last visit?  yes CT/MRI Spinal MRI in [facility]  Any changes since last visit?  no    Family History  Problem Relation Age of Onset   Sjogren's syndrome Mother    COPD Mother    Colon polyps Father    Dementia Father    Throat cancer Father    Melanoma Brother    Colon cancer Neg Hx    Esophageal cancer Neg Hx    Stomach cancer Neg Hx    Rectal cancer Neg Hx    Liver disease Neg Hx    Social History   Socioeconomic History   Marital status: Married    Spouse name: Not on file   Number of children: 3   Years of education: MD   Highest education level: Not on file  Occupational History   Occupation: physician    Comment: internal medicine  Tobacco Use   Smoking status: Some Days    Current packs/day: 0.25    Types: Cigarettes, Cigars   Smokeless tobacco: Never   Tobacco comments:    Occasional cigar  Vaping Use   Vaping status: Never Used  Substance and Sexual Activity   Alcohol use: Yes    Alcohol/week: 0.0 standard drinks of alcohol    Comment: occ   Drug use: No   Sexual activity: Yes    Partners: Female  Other Topics Concern   Not on file  Social History Narrative   Regular exercise: yes, very active   Caffeine use: 2 cups of coffee and soda's daily.   Right-handed.   Social Drivers of Corporate investment banker Strain: Not on file  Food Insecurity: Not on file  Transportation Needs: Not on file  Physical Activity: Not on file  Stress: Not on file  Social Connections: Not on file   Past Surgical History:  Procedure Laterality Date   APPENDECTOMY     KNEE ARTHROPLASTY Left    KNEE ARTHROSCOPY  09/01/2011   Procedure: ARTHROSCOPY KNEE;   Surgeon: Harvie Junior;  Location: [facility];  Service: Orthopedics;  Laterality: Left;  medial and lateral meniscus tear debridment  and chondroplasty   LASIK     ORCHIECTOMY     Lt. 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   Numbness and tingling    Other and unspecified anterior pituitary hyperfunction    Sleep apnea    Unspecified asthma(493.90)    controlled   Ht 5\' 10"  (1.778 m)   Wt 216 lb (98 kg)   BMI 30.99 kg/m   Opioid  Risk Score:   Fall Risk Score:  `1  Depression screen [facility] 2/9     11/24/2023    3:11 PM 12/09/2022    2:01 PM 08/22/2018    4:57 PM  Depression screen PHQ 2/9  Decreased Interest 0 0 0  Down, Depressed, Hopeless 0 0 0  PHQ - 2 Score 0 0 0  Altered sleeping 0 0   Tired, decreased energy 0 1   Change in appetite 0 0   Feeling bad or failure about yourself  0 0   Trouble concentrating 0 0   Moving slowly or fidgety/restless 0 0   Suicidal thoughts 0 0   PHQ-9 Score 0 1   Difficult doing work/chores  Not difficult at all     Review of Systems  Musculoskeletal:  Positive for back pain.       Back spasms  Neurological:        Numbness & tingling  in left leg  All other systems reviewed and are negative.      Objective:   Physical Exam  General no acute distress Mood and affect appropriate Extremities without edema Good musculature in bilateral lower extremities Less developed musculature in the gluteal region Lumbar spine has no tenderness palpation lumbar paraspinal area no tenderness over the gluteal musculature or over the greater trochanters   Sacral thrust (prone) : Negative Lateral compression:  Negative FABER's: Negative Distraction (supine): Negative Thigh thrust test: Negative  5/5 strength bilateral hip flexor knee extensor ankle dorsiflexor hip abductor as well as knee flexor Sensation is reduced over the left lateral plantar region Otherwise sensation intact in lower extremities Negative straight leg raising  Range of motion intact hips knees ankles  Lumbar range of motion is within normal limits with lumbar flexion extension lateral bending and rotation     Assessment & Plan:   1.  Lumbar spinal stenosis with predominant symptoms of low back pain and left lower extremity lumbosacral radiculitis We discussed activity recommendations We discussed generally speaking standing and walking activities would lead to increased foraminal narrowing and would most likely exacerbate his left-sided radicular symptoms.  We discussed that bicycling should help relieve the symptoms and providing good means of aerobic conditioning We discussed the importance of gluteal muscular strengthening with pelvic lifts, bird-dog exercises but avoiding lumbar extension during these activities. We discussed potential advantages of aquatic physical therapy either preop or postop We discussed the importance of overall muscle strengthening including upper body with light weights or resistance bands particularly upper back region We discussed yoga as a alternate means of flexibility training as well as core strengthening.  I will see the patient back on an as-needed basis.  He can call if he needs a physical therapy referral  .  >50% of the 45-minute visit was spent counseling &coordinating care

## 2023-11-28 ENCOUNTER — Encounter: Payer: Self-pay | Admitting: Allergy and Immunology

## 2023-11-29 DIAGNOSIS — G4733 Obstructive sleep apnea (adult) (pediatric): Secondary | ICD-10-CM | POA: Diagnosis not present

## 2023-12-05 DIAGNOSIS — M5416 Radiculopathy, lumbar region: Secondary | ICD-10-CM | POA: Diagnosis not present

## 2023-12-06 ENCOUNTER — Encounter: Payer: Self-pay | Admitting: Neurosurgery

## 2023-12-07 ENCOUNTER — Other Ambulatory Visit: Payer: Self-pay | Admitting: Psychiatry

## 2023-12-07 ENCOUNTER — Other Ambulatory Visit (HOSPITAL_BASED_OUTPATIENT_CLINIC_OR_DEPARTMENT_OTHER): Payer: Self-pay

## 2023-12-07 DIAGNOSIS — F5105 Insomnia due to other mental disorder: Secondary | ICD-10-CM

## 2023-12-07 MED ORDER — ALPRAZOLAM 0.5 MG PO TABS
0.5000 mg | ORAL_TABLET | Freq: Three times a day (TID) | ORAL | 0 refills | Status: DC | PRN
  Filled 2023-12-07: qty 270, 90d supply, fill #0

## 2023-12-08 ENCOUNTER — Other Ambulatory Visit: Payer: Self-pay

## 2023-12-08 ENCOUNTER — Other Ambulatory Visit (HOSPITAL_BASED_OUTPATIENT_CLINIC_OR_DEPARTMENT_OTHER): Payer: Self-pay

## 2023-12-08 DIAGNOSIS — M25552 Pain in left hip: Secondary | ICD-10-CM | POA: Diagnosis not present

## 2023-12-16 NOTE — Telephone Encounter (Signed)
 Spoke with Dr Drue Novel. He would like surgery as soon as possible. Surgery packet placed on Dr Lucienne Capers desk.  He would also like a telephone visit with Dr Myer Haff to discuss the surgery. Appointment scheduled for next week.   He asked that I notify Dr Myer Haff of his alpha-gal allergy in the event that this makes a difference for any graft or anything used for surgery. I discussed this with Dr Myer Haff via secure chat and added it to his allergy list.

## 2023-12-19 ENCOUNTER — Encounter: Payer: Self-pay | Admitting: Allergy and Immunology

## 2023-12-19 ENCOUNTER — Telehealth: Payer: Self-pay

## 2023-12-19 DIAGNOSIS — M7138 Other bursal cyst, other site: Secondary | ICD-10-CM

## 2023-12-19 DIAGNOSIS — G8929 Other chronic pain: Secondary | ICD-10-CM

## 2023-12-19 DIAGNOSIS — Z87891 Personal history of nicotine dependence: Secondary | ICD-10-CM

## 2023-12-19 DIAGNOSIS — M5416 Radiculopathy, lumbar region: Secondary | ICD-10-CM

## 2023-12-19 DIAGNOSIS — M532X6 Spinal instabilities, lumbar region: Secondary | ICD-10-CM

## 2023-12-19 NOTE — Telephone Encounter (Incomplete)
 Please see below for information in regards to your upcoming surgery:   Planned surgery: L4-S1 minimally invasive (MIS) transforaminal lumbar interbody fusion (TLIF)   Surgery date: *** at [facility]: [address] [place], [place] [address]) - you will find out your arrival time the business day before your surgery.   Pre-op appointment at [facility] Pre-admit Testing: we will call you with a date/time for this. If you are scheduled for an in person appointment, Pre-admit Testing is located on the first floor of the [facility] building, [address], [address]. Please bring all prescriptions in the original prescription bottles to your appointment. During this appointment, they will advise you which medications you can take the morning of surgery, and which medications you will need to hold for surgery. Labs (such as blood work, EKG) may be done at your pre-op appointment. You are not required to fast for these labs. Should you need to change your pre-op appointment, please call Pre-admit testing at [phone].    ***confirm not on aspirin, blood thinners, diabetes medications, biologics   Surgical clearance: we will send a clearance form to ***Dr Shaune Pollack. They may wish to see you in their office prior to signing the clearance form. If so, they may call you to schedule an appointment.     NSAIDS (Non-steroidal anti-inflammatory drugs): because you are having a fusion, please avoid taking any NSAIDS (examples: ibuprofen, motrin, aleve, naproxen, meloxicam, diclofenac) for 3 months after surgery. Celebrex is an exception and is OK to take, if prescribed. Tylenol is not an NSAID.    Common restrictions after surgery: No bending, lifting, or twisting ("BLT"). Avoid lifting objects heavier than 10 pounds for the first 6 weeks after surgery. Where possible, avoid household activities that involve lifting, bending, reaching, pushing,  or pulling such as laundry, vacuuming, grocery shopping, and childcare. Try to arrange for help from friends and family for these activities while you heal. Do not drive while taking prescription pain medication. Weeks 6 through 12 after surgery: avoid lifting more than 25 pounds.    X-rays after surgery: Because you are having a fusion: for appointments after your 2 week follow-up: please arrive at the [facility] outpatient imaging center ([address], [place]) or [facility] one hour prior to your appointment for x-rays. This applies to every appointment after your 2 week follow-up. Failure to do so may result in your appointment being rescheduled.   How to contact us:  If you have any questions/concerns before or after surgery, you can reach [place] at [phone], or you can send a mychart message. We can be reached by phone or mychart 8am-4pm, Monday-Friday.  *Please note: Calls after 4pm are forwarded to a third party answering service. Mychart messages are not routinely monitored during evenings, weekends, and holidays. Please call our office to contact the answering service for urgent concerns during non-business hours.     If you have FMLA/disability paperwork, please drop it off or fax it to [phone], attention Patty.   Appointments/FMLA & disability paperwork: Joycelyn Rua, & Flonnie Hailstone Registered Nurse/Surgery scheduler: Royston Cowper Medical Assistants: Nash Mantis Physician Assistants: Joan Flores, PA-C, Manning Charity, PA-C & Drake Leach, PA-C Surgeons: Venetia Night, MD & Ernestine Mcmurray, MD

## 2023-12-20 NOTE — Telephone Encounter (Signed)
 Notes scanned into media with date of 09/14/2023

## 2023-12-22 ENCOUNTER — Ambulatory Visit: Attending: Neurosurgery | Admitting: Physical Therapy

## 2023-12-22 ENCOUNTER — Ambulatory Visit: Admitting: Neurosurgery

## 2023-12-22 ENCOUNTER — Encounter: Payer: Self-pay | Admitting: Physical Therapy

## 2023-12-22 DIAGNOSIS — M5416 Radiculopathy, lumbar region: Secondary | ICD-10-CM

## 2023-12-22 DIAGNOSIS — M62830 Muscle spasm of back: Secondary | ICD-10-CM | POA: Insufficient documentation

## 2023-12-22 DIAGNOSIS — M5442 Lumbago with sciatica, left side: Secondary | ICD-10-CM

## 2023-12-22 DIAGNOSIS — G8929 Other chronic pain: Secondary | ICD-10-CM

## 2023-12-22 DIAGNOSIS — M5459 Other low back pain: Secondary | ICD-10-CM | POA: Diagnosis not present

## 2023-12-22 DIAGNOSIS — M532X6 Spinal instabilities, lumbar region: Secondary | ICD-10-CM

## 2023-12-22 DIAGNOSIS — M5441 Lumbago with sciatica, right side: Secondary | ICD-10-CM

## 2023-12-22 DIAGNOSIS — M7138 Other bursal cyst, other site: Secondary | ICD-10-CM

## 2023-12-22 DIAGNOSIS — M4310 Spondylolisthesis, site unspecified: Secondary | ICD-10-CM

## 2023-12-22 NOTE — Progress Notes (Signed)
 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  He has had ongoing back and right leg pain.  He has had intermittent pain for years.  He continues to have pain.  He is going back for physical therapy reevaluation today.  He smoked his last cigar 2 days ago.   11/10/2023 Mr. Webber Michiels is here today with a chief complaint of back pain and R sided pain.  He began having symptoms on the R a few days ago.  His pain peaked last night.  He did not have paresthesias. He had mostly R sided pain into his buttock.  He had trouble walking yesterday.    This is his 4th episode of pain in the past year.  Each episode lasts a few weeks, then he has an interval of reasonable pain for a couple of weeks before he has another episode of pain.    He had a L-sided injection at L5/S1 today.   He had an injection on the left side a few weeks ago without much impact to his symptoms.    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  He has been doing some exercises, but not started physical therapy.  He has had 1 injection which did help.  He has another injection scheduled.       08/25/2023 Mr. Adedamola Seto is here today with a chief complaint of intermittent low back pain that began in December 2023.  He went on a hike in December and had onset of back pain.  It subsequently improved, but he had an additional episode that began in April 2024.  Since that time, he also began having pain into his left buttock and numbness and weakness in his left foot.  His numbness on the bottom of the foot and he has some pain around his ankle.  His pain can get as bad as 7 out of 10 with walking or standing.  Sitting or laying down makes it improved.  He feels the pain almost immediately when he stands.  He has pain within walking 50  feet.   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   The symptoms are causing a significant impact on the patient's life.    I have utilized the care everywhere function in epic to review the outside records available from external health systems.    Review of Systems:  A 10 point review of systems is negative, except for the pertinent positives and negatives detailed in the HPI.  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 08/2009    Allergies: Allergies as of 12/22/2023 - Review Complete 12/22/2023  Allergen Reaction Noted   Alpha-gal Hives 12/16/2023   Demerol [meperidine hcl]  08/27/2011   Food  08/22/2018   Latex     Morphine and codeine  08/27/2011   Other Other (See Comments) 08/22/2018     Medications:  Current Outpatient Medications:    albuterol (VENTOLIN HFA) 108 (90 Base) MCG/ACT inhaler, Inhale 2 puffs into the lungs every 6 (six) hours as needed for wheezing or shortness of breath., Disp: 6.7 g, Rfl: 1   Albuterol-Budesonide (AIRSUPRA) 90-80 MCG/ACT AERO, Inhale 2 Inhalations into the lungs every 6 (six) hours as needed., Disp: 10.7 g, Rfl: 2   ALPRAZolam (XANAX) 0.5 MG tablet, Take 1 tablet (0.5 mg total) by mouth 3 (three) times daily as needed., Disp: 270 tablet, Rfl: 0   budesonide-formoterol (SYMBICORT) 80-4.5 MCG/ACT inhaler, Inhale 2 puffs into the lungs 2 (two) times daily., Disp: 30.6 g, Rfl: 5   EPINEPHrine (EPIPEN 2-PAK) 0.3 mg/0.3 mL IJ SOAJ injection, Inject 0.3 mg into the muscle as needed for anaphylaxis., Disp: 2 each, Rfl: 3   fexofenadine (ALLEGRA) 60 MG tablet, Take 60 mg by mouth 2 (two) times daily., Disp: , Rfl:    FLUoxetine (PROZAC) 20 MG capsule, Take 1 capsule (20 mg total) by mouth daily., Disp: 90 capsule, Rfl: 1   methocarbamol (ROBAXIN) 500 MG tablet, Take 1 tablet (500 mg total) by mouth every 6 (six) hours as needed for muscle spasms., Disp: 120 tablet, Rfl: 0   pantoprazole (PROTONIX) 40 MG tablet, Take 1 tablet (40 mg total) by mouth 2 (two) times daily., Disp: 180 tablet, Rfl: 1   valACYclovir (VALTREX) 1000 MG tablet, Take 1 tablet (1,000 mg total) by mouth daily., Disp: 90 tablet, Rfl: 0  Social History: Social History   Tobacco Use   Smoking status: Some Days    Current packs/day: 0.25    Types: Cigarettes, Cigars   Smokeless tobacco: Never   Tobacco comments:    Occasional cigar  Vaping Use   Vaping status: Never Used  Substance Use Topics   Alcohol use: Yes    Alcohol/week: 0.0 standard drinks of alcohol    Comment: occ   Drug use: No    Family Medical History: Family History  Problem Relation Age of Onset   Sjogren's syndrome Mother    COPD Mother    Colon polyps Father    Dementia Father    Throat cancer  Father    Melanoma Brother    Colon cancer Neg Hx    Esophageal cancer Neg Hx    Stomach cancer Neg Hx    Rectal cancer Neg Hx    Liver disease Neg Hx     Physical Examination: Telephone visit  Medical Decision Making  Imaging: MRI L spine 08/27/2023 IMPRESSION: 1. Interval resolution of the previously  seen right subarticular disc extrusion at L5-S1. Unchanged severe left and moderate right neural foraminal narrowing at this level. 2. Moderate bilateral neural foraminal narrowing at L4-5, slightly worse on the right. 3. Moderate right neural foraminal narrowing at L3-4.     Electronically Signed   By: Orvan Falconer M.D.   On: 08/31/2023 09:43   I have reviewed his x-rays as well.  He has some retrolisthesis of L4 and L5.  He has history of a synovial cyst at this level suggesting instability.  I have personally reviewed the images and agree with the above interpretation.  Assessment and Plan: Mr. Lusk is a pleasant 63 y.o. male with severe exacerbation of back pain with right-sided sciatica.  He has previously had multiple episodes of left-sided sciatica and right sided sciatica.  Over the past year, his episodes have being longer in his asymptomatic intervals and been getting shorter.  He tried physical therapy without substantial long-term improvement.  He is returning for reevaluation today.  He has had multiple injections.  At this point, he has tried and failed conservative management extensively.  We discussed that he has bilateral L5 compression which I think is symptomatic.  Based on his flexion-extension radiographs, he also has some retrolisthesis of L4 and L5 which changes between flexion and extension.  He also has history of a synovial cyst at that level, which suggest some instability. Because of his retrolisthesis and the position of his posterior superior iliac crest, far lateral foraminotomy at L5-S1 is not possible.  As such, I would recommend L4-S1  transforaminal lumbar interbody fusion to address his retrolisthesis, foraminal compression, and implied instability.  He will let me know has reevaluation with physical therapy goes.  If he does not have symptomatic improvement with physical therapy or is discharged is not an appropriate candidate, we will consider L4-S1 minimally invasive TLIF.  He will have to be off his cigars for 30 days prior to surgical intervention.  This visit was performed via telephone.  Patient location: home Provider location: office  I spent a total of 4 minutes non-face-to-face activities for this visit on the date of this encounter including review of current clinical condition and response to treatment.  The patient is aware of and accepts the limits of this telehealth visit.   Thank you for involving me in the care of this patient.      Christan Ciccarelli K. Myer Haff MD, [facility]

## 2023-12-22 NOTE — Therapy (Signed)
 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 DOB:July 22, 1961, 63 y.o., male Today's Date: 12/22/2023  END OF SESSION:   PT End of Session - 12/22/23 1154     Visit Number 1    Date for PT Re-Evaluation 03/23/24    Authorization Type [place] [employer]    PT Start Time 0920    PT Stop Time 1015    PT Time Calculation (min) 55 min    Activity Tolerance Patient tolerated treatment well    Behavior During Therapy [facility] for tasks assessed/performed              Past Medical History:  Diagnosis Date   Anxiety state, unspecified    Atypical mole 2008   Complication of anesthesia    had some difficulty voiding   Dermatitis    anal dermatitis   Hemorrhoids    Herpes simplex without mention of complication    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 08/2009   Patient Active Problem List   Diagnosis Date Noted   Dependence on CPAP ventilation 03/27/2023   Abnormal transaminases 08/08/2019   Viral respiratory illness 12/24/2018   Preventative health care 08/22/2018   Tear of LCL (lateral collateral ligament) of knee, left, initial encounter 08/18/2017   Lumbar radiculopathy 01/20/2017   Paresthesia 08/18/2016   Low serum testosterone 12/16/2015   Metatarsalgia of left foot 09/13/2013   OSA on CPAP 09/30/2011   Hypogonadism male 07/06/2011   Herpes simplex virus (HSV) infection  12/11/2010   Anxiety state 12/11/2010   Asthma 12/11/2010   SEMINOMA 12/11/2009    PCP: Laury Axon, MD  REFERRING PROVIDER: Myer Haff, MD  REFERRING DIAG: Lumbar radiculopathy  Rationale for Evaluation and Treatment: Rehabilitation  THERAPY DIAG:  Other low back pain  Muscle spasm of back  ONSET DATE: 08/25/23  SUBJECTIVE:  SUBJECTIVE STATEMENT: Patient reports that he has had chronic back pain for years, much worse over the past year, had a time where he had 6 weeks of pain.  Has tried a Systems analyst.    PERTINENT HISTORY:  See above  PAIN:  Are you having pain? Yes: NPRS scale: 2/10 Pain location: low back left > right Pain description: sharp and shooting, numbness left lateral foot Aggravating factors: twisting, standing, walking 15 minutes pain up to 10/10 Relieving factors: ibuprofen, support pain at best a 2/10  PRECAUTIONS: None  RED FLAGS: None   WEIGHT BEARING RESTRICTIONS: No  FALLS:  Has patient fallen in last 6 months? No  LIVING ENVIRONMENT: Lives with: lives with their family Lives in: House/apartment Stairs: Yes: Internal: 16 steps; can reach both Has following equipment at home: None  OCCUPATION: MD, on feet a lot  PLOF: Independent and hiking up to 6 hours, gym 2-3x/week  PATIENT GOALS: bike, hike, exercise  NEXT MD VISIT: not schedueled  OBJECTIVE:  Note: Objective measures were completed at Evaluation unless otherwise noted.  DIAGNOSTIC FINDINGS:  IMPRESSION: Mild multilevel lumbar spine DDD, worse at L1-L2 and L2-L3.  IMPRESSION: 1. Interval resolution of the previously seen right subarticular disc extrusion at L5-S1. Unchanged severe left and moderate right neural foraminal narrowing at this level. 2. Moderate bilateral neural foraminal  narrowing at L4-5, slightly worse on the right. 3. Moderate right neural foraminal narrowing at L3-4.  PATIENT SURVEYS:  Modified Oswestry 20/50 = 40%   COGNITION: Overall cognitive status: Within functional limits for tasks assessed     SENSATION: WFL  MUSCLE LENGTH: Tight HS SLR 60 degrees, tight and tender piriformis, hip flexors mild tightness  POSTURE: rounded shoulders, forward head, and decreased lumbar lordosis  PALPATION: Tight but non tender  LUMBAR ROM:   AROM eval  Flexion Decreased 25%  Extension Decreased 25%  Right lateral flexion Decreased 25%  Left lateral flexion Decreased 25%  Right rotation   Left rotation    (Blank rows = not tested)  LOWER EXTREMITY ROM:   WFL's  LOWER EXTREMITY MMT:    MMT Right eval Left eval  Hip flexion  4-  Hip extension    Hip abduction  4-  Hip adduction    Hip internal rotation    Hip external rotation    Knee flexion  4-  Knee extension  4-  Ankle dorsiflexion  4  Ankle plantarflexion    Ankle inversion    Ankle eversion     (Blank rows = not tested)  LUMBAR SPECIAL TESTS:  Straight leg raise test: Positive and Slump test: Positive  SLR positive on the left at 65 degrees GAIT: Distance walked: 100 feet Assistive device utilized: None Level of assistance: Complete Independence Comments: mild antalgic ont he left, does report at times that he has his left foot drag  TREATMENT DATE:  12/22/23 Evaluation Static lumbar traction 93#  PATIENT EDUCATION:  Education details: HEP/POC Person educated: Patient Education method: Explanation, Demonstration, Actor cues, Verbal cues, and Handouts Education comprehension: verbalized understanding  HOME EXERCISE PROGRAM: Access Code: [email or web address] URL: [email or web address] Date: 12/22/2023 Prepared by: Stacie Glaze  Exercises - Seated Table Hamstring Stretch  - 1 x daily - 7 x weekly - 1 sets - 5 reps - 30 hold - Supine Piriformis Stretch Pulling Heel to Hip  - 1 x daily - 7 x weekly - 1 sets - 5 reps - 30 hold  ASSESSMENT:  CLINICAL IMPRESSION: Patient is a 63 y.o. male who was seen today for physical therapy evaluation and treatment for Lumbar radiculopathy.   He reports that he has had issues with his back for a number of years, he reports that he is worse the past year and has had more severe episodes of pain a 10/10 over the past 6 months, with MMT today the left leg is weaker than the right, he reports that this is the first time he has noticed this.  He is tight in the HS and piriformis.  We did discuss posture and body mechanics is a huge part of him getting better.  OBJECTIVE IMPAIRMENTS: Abnormal gait, cardiopulmonary status limiting activity, decreased activity tolerance, decreased endurance, decreased mobility, difficulty walking, decreased ROM, decreased strength, increased muscle spasms, impaired flexibility, improper body mechanics, postural dysfunction, and pain.   REHAB POTENTIAL: Good  CLINICAL DECISION MAKING: Stable/uncomplicated  EVALUATION COMPLEXITY: Low   GOALS: Goals reviewed with patient? Yes  SHORT TERM GOALS: Target date: 01/13/24  Independent with initial HEP Baseline: Goal status: INITIAL   LONG TERM GOALS: Target date: 03/23/24  Understand posture and body mechanics Baseline:  Goal status: INITIAL  2.  Increase SLR to 75 degrees Baseline: 65 dehrees Goal status: INITIAL  3.  Decrease pain 50%  Baseline: pain at times up to 10/10 Goal status: INITIAL  4.  Report over 6 weeks no severe set backs Baseline:  Goal status: INITIAL  5.  Increase left LE strength to 4+/5 Baseline: 4-/5 Goal status: INITIAL  6.  Improve the ODI score to 20% Baseline: 40% Goal status: INITIAL  PLAN:  PT FREQUENCY: 1-2x/week  PT DURATION: 12 weeks  PLANNED  INTERVENTIONS: 97164- PT Re-evaluation, 97110-Therapeutic exercises, 97530- Therapeutic activity, 97112- Neuromuscular re-education, 97535- Self Care, 40981- Manual therapy, G0283- Electrical stimulation (unattended), 19147- Traction (mechanical), Patient/Family education, Taping, Dry Needling, Joint mobilization, Spinal mobilization, Cryotherapy, and Moist heat.  PLAN FOR NEXT SESSION: see how the HEP is doing and how the traction did   Bharat Antillon W, PT 12/22/2023, 11:54 AM

## 2023-12-27 DIAGNOSIS — G4733 Obstructive sleep apnea (adult) (pediatric): Secondary | ICD-10-CM | POA: Diagnosis not present

## 2023-12-29 ENCOUNTER — Encounter: Payer: Self-pay | Admitting: Physical Therapy

## 2023-12-29 ENCOUNTER — Ambulatory Visit: Admitting: Physical Therapy

## 2023-12-29 DIAGNOSIS — M62830 Muscle spasm of back: Secondary | ICD-10-CM | POA: Diagnosis not present

## 2023-12-29 DIAGNOSIS — M5459 Other low back pain: Secondary | ICD-10-CM

## 2023-12-29 DIAGNOSIS — M5416 Radiculopathy, lumbar region: Secondary | ICD-10-CM | POA: Diagnosis not present

## 2023-12-29 NOTE — Therapy (Signed)
 OUTPATIENT PHYSICAL THERAPY THORACOLUMBAR EVALUATION   Patient Name: SAVIO ALBRECHT, MD MRN: [id number] DOB:04-05-1961, 63 y.o., male Today's Date: 12/29/2023  END OF SESSION:  PT End of Session - 12/29/23 1657     Visit Number 2    Date for PT Re-Evaluation 03/23/24    Authorization Type [place] [employer]    PT Start Time 1657    PT Stop Time 1745    PT Time Calculation (min) 48 min    Activity Tolerance Patient tolerated treatment well    Behavior During Therapy [facility] for tasks assessed/performed             PT End of Session - 12/29/23 1657     Visit Number 2    Date for PT Re-Evaluation 03/23/24    Authorization Type [place] [employer]    PT Start Time 1657    PT Stop Time 1745    PT Time Calculation (min) 48 min    Activity Tolerance Patient tolerated treatment well    Behavior During Therapy WFL for tasks assessed/performed              Past Medical History:  Diagnosis Date   Anxiety state, unspecified    Atypical mole 2008   Complication of anesthesia    had some difficulty voiding   Dermatitis    anal dermatitis   Hemorrhoids    Herpes simplex without mention of complication    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 08/2009   Patient Active Problem List   Diagnosis Date Noted   Dependence on CPAP ventilation 03/27/2023   Abnormal transaminases 08/08/2019   Viral respiratory illness 12/24/2018   Preventative  health care 08/22/2018   Tear of LCL (lateral collateral ligament) of knee, left, initial encounter 08/18/2017   Lumbar radiculopathy 01/20/2017   Paresthesia 08/18/2016   Low serum testosterone 12/16/2015   Metatarsalgia of left foot 09/13/2013   OSA on CPAP 09/30/2011   Hypogonadism male 07/06/2011   Herpes simplex virus (HSV) infection 12/11/2010   Anxiety state 12/11/2010   Asthma 12/11/2010   SEMINOMA 12/11/2009    PCP: Laury Axon, MD  REFERRING PROVIDER: Myer Haff, MD  REFERRING DIAG: Lumbar radiculopathy  Rationale for Evaluation and Treatment: Rehabilitation  THERAPY DIAG:  Other low back pain  Muscle spasm of back  ONSET DATE: 08/25/23  SUBJECTIVE:  SUBJECTIVE STATEMENT: Patient doing okay, reports that he has been doing the exercises, reports that he felt better after the traction .  Reports that if he tries to walk 2 blocks pain will increase to 6/10  PERTINENT HISTORY:  See above  PAIN:  Are you having pain? Yes: NPRS scale: 2/10 Pain location: low back left > right Pain description: sharp and shooting, numbness left lateral foot Aggravating factors: twisting, standing, walking 15 minutes pain up to 10/10 Relieving factors: ibuprofen, support pain at best a 2/10  PRECAUTIONS: None  RED FLAGS: None   WEIGHT BEARING RESTRICTIONS: No  FALLS:  Has patient fallen in last 6 months? No  LIVING ENVIRONMENT: Lives with: lives with their family Lives in: House/apartment Stairs: Yes: Internal: 16 steps; can reach both Has following equipment at home: None  OCCUPATION: MD, on feet a lot  PLOF: Independent and hiking up to 6 hours, gym 2-3x/week  PATIENT GOALS: bike, hike, exercise  NEXT MD VISIT: not schedueled  OBJECTIVE:  Note: Objective measures were completed at  Evaluation unless otherwise noted.  DIAGNOSTIC FINDINGS:  IMPRESSION: Mild multilevel lumbar spine DDD, worse at L1-L2 and L2-L3.  IMPRESSION: 1. Interval resolution of the previously seen right subarticular disc extrusion at L5-S1. Unchanged severe left and moderate right neural foraminal narrowing at this level. 2. Moderate bilateral neural foraminal narrowing at L4-5, slightly worse on the right. 3. Moderate right neural foraminal narrowing at L3-4.  PATIENT SURVEYS:  Modified Oswestry 20/50 = 40%   COGNITION: Overall cognitive status: Within functional limits for tasks assessed     SENSATION: WFL  MUSCLE LENGTH: Tight HS SLR 60 degrees, tight and tender piriformis, hip flexors mild tightness  POSTURE: rounded shoulders, forward head, and decreased lumbar lordosis  PALPATION: Tight but non tender  LUMBAR ROM:   AROM eval  Flexion Decreased 25%  Extension Decreased 25%  Right lateral flexion Decreased 25%  Left lateral flexion Decreased 25%  Right rotation   Left rotation    (Blank rows = not tested)  LOWER EXTREMITY ROM:   WFL's  LOWER EXTREMITY MMT:    MMT Right eval Left eval  Hip flexion  4-  Hip extension    Hip abduction  4-  Hip adduction    Hip internal rotation    Hip external rotation    Knee flexion  4-  Knee extension  4-  Ankle dorsiflexion  4  Ankle plantarflexion    Ankle inversion    Ankle eversion     (Blank rows = not tested)  LUMBAR SPECIAL TESTS:  Straight leg raise test: Positive and Slump test: Positive  SLR positive on the left at 65 degrees GAIT: Distance walked: 100 feet Assistive device utilized: None Level of assistance: Complete Independence Comments: mild antalgic ont he left, does report at times that he has his left foot drag  TREATMENT DATE:  12/29/23 Nustep level 5 x 5 minutes 25# lats 25# rows cues for posture 10# straight arm pulls cues for core 20# AR press Red tband row, extension and lats Green tband  hip extension and abduction Passive stretch LE's Static lumbar traction  12/22/23 Evaluation Static lumbar traction 93#  PATIENT EDUCATION:  Education details: HEP/POC Person educated: Patient Education method: Explanation, Demonstration, Actor cues, Verbal cues, and Handouts Education comprehension: verbalized understanding  HOME EXERCISE PROGRAM: Access Code: [email or web address] URL: [email or web address] Date: 12/22/2023 Prepared by: Stacie Glaze  Exercises - Seated Table Hamstring Stretch  - 1 x daily - 7 x weekly - 1 sets - 5 reps - 30 hold - Supine Piriformis Stretch Pulling Heel to Hip  - 1 x daily - 7 x weekly - 1 sets - 5 reps - 30 hold  Access Code: [email or web address] URL: [email or web address] Date: 12/29/2023 Prepared by: Stacie Glaze  Exercises - Standing Shoulder Row with Anchored Resistance  - 1 x daily - 7 x weekly - 3 sets - 10 reps - 3 hold - Shoulder extension with resistance - Neutral  - 1 x daily - 7 x weekly - 3 sets - 10 reps - 3 hold - Staggered Lat Pull Down with Resistance - Elbows Bent  - 1 x daily - 7 x weekly - 3 sets - 10 reps - 3 hold - Standing Anti-Rotation Press with Anchored Resistance  - 1 x daily - 7 x weekly - 3 sets - 10 reps - 3 hold - Standing Repeated Hip Extension with Resistance  - 1 x daily - 7 x weekly - 3 sets - 10 reps - 3 hold - Standing Repeated Hip Abduction with Resistance  - 1 x daily - 7 x weekly - 3 sets - 10 reps - 3 hold ASSESSMENT:  CLINICAL IMPRESSION: Patient is a 63 y.o. male who was seen today for physical therapy evaluation and treatment for Lumbar radiculopathy.   He reports that he has had issues with his back for a number of years, he reports that he is worse the past year and has had more severe episodes of pain a 10/10 over the past 6 months, with MMT today the left  leg is weaker than the right, he reports that this is the first time he has noticed this.  He is tight in the HS and piriformis.  We did discuss posture and body mechanics is a huge part of him getting better.  OBJECTIVE IMPAIRMENTS: Abnormal gait, cardiopulmonary status limiting activity, decreased activity tolerance, decreased endurance, decreased mobility, difficulty walking, decreased ROM, decreased strength, increased muscle spasms, impaired flexibility, improper body mechanics, postural dysfunction, and pain.   REHAB POTENTIAL: Good  CLINICAL DECISION MAKING: Stable/uncomplicated  EVALUATION COMPLEXITY: Low   GOALS: Goals reviewed with patient? Yes  SHORT TERM GOALS: Target date: 01/13/24  Independent with initial HEP Baseline: Goal status: met 12/29/23   LONG TERM GOALS: Target date: 03/23/24  Understand posture and body mechanics Baseline:  Goal status: INITIAL  2.  Increase SLR to 75 degrees Baseline: 65 dehrees Goal status: INITIAL  3.  Decrease pain 50%  Baseline: pain at times up to 10/10 Goal status: INITIAL  4.  Report over 6 weeks no severe set backs Baseline:  Goal status: INITIAL  5.  Increase left LE strength to 4+/5 Baseline: 4-/5 Goal status: INITIAL  6.  Improve the ODI score to 20% Baseline: 40% Goal status: INITIAL  PLAN:  PT FREQUENCY: 1-2x/week  PT DURATION: 12 weeks  PLANNED INTERVENTIONS: 97164- PT Re-evaluation, 97110-Therapeutic exercises, 97530- Therapeutic activity, 97112- Neuromuscular re-education, 97535- Self Care, 44010- Manual therapy, G0283- Electrical stimulation (unattended), 27253- Traction (mechanical), Patient/Family education, Taping, Dry Needling, Joint mobilization, Spinal mobilization, Cryotherapy, and Moist heat.  PLAN FOR NEXT SESSION: see how the progression of exercise was  Jearld Lesch, PT 12/29/2023, 5:01 PM

## 2024-01-05 ENCOUNTER — Ambulatory Visit: Admitting: Physical Therapy

## 2024-01-05 ENCOUNTER — Encounter: Payer: Self-pay | Admitting: Physical Therapy

## 2024-01-05 DIAGNOSIS — M5459 Other low back pain: Secondary | ICD-10-CM

## 2024-01-05 DIAGNOSIS — M5416 Radiculopathy, lumbar region: Secondary | ICD-10-CM | POA: Diagnosis not present

## 2024-01-05 DIAGNOSIS — M62830 Muscle spasm of back: Secondary | ICD-10-CM | POA: Diagnosis not present

## 2024-01-05 NOTE — Therapy (Signed)
 OUTPATIENT PHYSICAL THERAPY THORACOLUMBAR EVALUATION   Patient Name: Daniel POSTLEWAITE, MD MRN: [id number] DOB:November 26, 1960, 63 y.o., male Today's Date: 01/05/2024  END OF SESSION:  PT End of Session - 01/05/24 1356     Visit Number 3    Authorization Type [place] [employer]    PT Start Time 1357    PT Stop Time 1445    PT Time Calculation (min) 48 min    Activity Tolerance Patient tolerated treatment well    Behavior During Therapy [facility] for tasks assessed/performed             PT End of Session - 01/05/24 1356     Visit Number 3    Authorization Type [place] [employer]    PT Start Time 1357    PT Stop Time 1445    PT Time Calculation (min) 48 min    Activity Tolerance Patient tolerated treatment well    Behavior During Therapy WFL for tasks assessed/performed              Past Medical History:  Diagnosis Date   Anxiety state, unspecified    Atypical mole 2008   Complication of anesthesia    had some difficulty voiding   Dermatitis    anal dermatitis   Hemorrhoids    Herpes simplex without mention of complication    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 08/2009   Patient Active Problem List   Diagnosis Date Noted   Dependence on CPAP ventilation 03/27/2023   Abnormal transaminases 08/08/2019   Viral respiratory illness 12/24/2018   Preventative health care 08/22/2018   Tear of LCL (lateral collateral ligament) of knee, left,  initial encounter 08/18/2017   Lumbar radiculopathy 01/20/2017   Paresthesia 08/18/2016   Low serum testosterone 12/16/2015   Metatarsalgia of left foot 09/13/2013   OSA on CPAP 09/30/2011   Hypogonadism male 07/06/2011   Herpes simplex virus (HSV) infection 12/11/2010   Anxiety state 12/11/2010   Asthma 12/11/2010   SEMINOMA 12/11/2009    PCP: Laury Axon, MD  REFERRING PROVIDER: Myer Haff, MD  REFERRING DIAG: Lumbar radiculopathy  Rationale for Evaluation and Treatment: Rehabilitation  THERAPY DIAG:  Other low back pain  ONSET DATE: 08/25/23  SUBJECTIVE:  SUBJECTIVE STATEMENT: Patient reports that after the last treatment he had an increase of pain in the back, thinks one of the exercises may have caused pain, reports he did walk with two hiking poles and was able to go 1.5 miles without pain >6/10  PERTINENT HISTORY:  See above  PAIN:  Are you having pain? Yes: NPRS scale: 2/10 Pain location: low back left > right Pain description: sharp and shooting, numbness left lateral foot Aggravating factors: twisting, standing, walking 15 minutes pain up to 10/10 Relieving factors: ibuprofen, support pain at best a 2/10  PRECAUTIONS: None  RED FLAGS: None   WEIGHT BEARING RESTRICTIONS: No  FALLS:  Has patient fallen in last 6 months? No  LIVING ENVIRONMENT: Lives with: lives with their family Lives in: House/apartment Stairs: Yes: Internal: 16 steps; can reach both Has following equipment at home: None  OCCUPATION: MD, on feet a lot  PLOF: Independent and hiking up to 6 hours, gym 2-3x/week  PATIENT GOALS: bike, hike, exercise  NEXT MD VISIT: not schedueled  OBJECTIVE:  Note: Objective measures were completed at Evaluation unless otherwise noted.  DIAGNOSTIC FINDINGS:   IMPRESSION: Mild multilevel lumbar spine DDD, worse at L1-L2 and L2-L3.  IMPRESSION: 1. Interval resolution of the previously seen right subarticular disc extrusion at L5-S1. Unchanged severe left and moderate right neural foraminal narrowing at this level. 2. Moderate bilateral neural foraminal narrowing at L4-5, slightly worse on the right. 3. Moderate right neural foraminal narrowing at L3-4.  PATIENT SURVEYS:  Modified Oswestry 20/50 = 40%   COGNITION: Overall cognitive status: Within functional limits for tasks assessed     SENSATION: WFL  MUSCLE LENGTH: Tight HS SLR 60 degrees, tight and tender piriformis, hip flexors mild tightness  POSTURE: rounded shoulders, forward head, and decreased lumbar lordosis  PALPATION: Tight but non tender  LUMBAR ROM:   AROM eval  Flexion Decreased 25%  Extension Decreased 25%  Right lateral flexion Decreased 25%  Left lateral flexion Decreased 25%  Right rotation   Left rotation    (Blank rows = not tested)  LOWER EXTREMITY ROM:   WFL's  LOWER EXTREMITY MMT:    MMT Right eval Left eval Left  01/05/24  Hip flexion  4- 4-  Hip extension     Hip abduction  4- 4+  Hip adduction     Hip internal rotation     Hip external rotation     Knee flexion  4- 4+  Knee extension  4- 4+  Ankle dorsiflexion  4 4+  Ankle plantarflexion     Ankle inversion     Ankle eversion      (Blank rows = not tested)  LUMBAR SPECIAL TESTS:  Straight leg raise test: Positive and Slump test: Positive  SLR positive on the left at 65 degrees GAIT: Distance walked: 100 feet Assistive device utilized: None Level of assistance: Complete Independence Comments: mild antalgic ont he left, does report at times that he has his left foot drag  TREATMENT DATE:  01/04/26 Gait outside with two canes but did not need to use, brisk pace around the back building, no increase of pain 35# seated row 35# lats Passive LE stretches Feet on ball K2C,  rotation, bridges, isometric abs Static lumbar traction 93#  12/29/23 Nustep level 5 x 5 minutes 25# lats 25# rows cues for posture 10# straight arm pulls cues for core 20# AR press Red tband row, extension and lats Green tband hip extension and abduction Passive stretch LE's  Static lumbar traction  12/22/23 Evaluation Static lumbar traction 93#                                                                                                                                  PATIENT EDUCATION:  Education details: HEP/POC Person educated: Patient Education method: Explanation, Demonstration, Tactile cues, Verbal cues, and Handouts Education comprehension: verbalized understanding  HOME EXERCISE PROGRAM: Access Code: [email or web address] URL: [email or web address] Date: 12/22/2023 Prepared by: Stacie Glaze  Exercises - Seated Table Hamstring Stretch  - 1 x daily - 7 x weekly - 1 sets - 5 reps - 30 hold - Supine Piriformis Stretch Pulling Heel to Hip  - 1 x daily - 7 x weekly - 1 sets - 5 reps - 30 hold  Access Code: [email or web address] URL: [email or web address] Date: 12/29/2023 Prepared by: Stacie Glaze  Exercises - Standing Shoulder Row with Anchored Resistance  - 1 x daily - 7 x weekly - 3 sets - 10 reps - 3 hold - Shoulder extension with resistance - Neutral  - 1 x daily - 7 x weekly - 3 sets - 10 reps - 3 hold - Staggered Lat Pull Down with Resistance - Elbows Bent  - 1 x daily - 7 x weekly - 3 sets - 10 reps - 3 hold - Standing Anti-Rotation Press with Anchored Resistance  - 1 x daily - 7 x weekly - 3 sets - 10 reps - 3 hold - Standing Repeated Hip Extension with Resistance  - 1 x daily - 7 x weekly - 3 sets - 10 reps - 3 hold - Standing Repeated Hip Abduction with Resistance  - 1 x daily - 7 x weekly - 3 sets - 10 reps - 3 hold ASSESSMENT:  CLINICAL IMPRESSION: Patient had increased LBP after our last visit trying to think if it was the AR press, the  farmer carry or the hip exercises, we backed off of this, he was able to walk around the building in the back without an increase of pain.  Testing the MMT he was better today compared to the evaluation.  Patient is a 64 y.o. male who was seen today for physical therapy evaluation and treatment for Lumbar radiculopathy.   He reports that he has had issues with his back for a number of years, he reports that he is worse the past year and has had more severe episodes of pain a 10/10 over the past 6 months,   OBJECTIVE IMPAIRMENTS: Abnormal gait, cardiopulmonary status limiting activity, decreased activity tolerance, decreased endurance, decreased mobility, difficulty walking, decreased ROM, decreased strength, increased muscle spasms, impaired flexibility, improper body mechanics, postural dysfunction, and pain.   REHAB POTENTIAL: Good  CLINICAL DECISION MAKING: Stable/uncomplicated  EVALUATION COMPLEXITY: Low   GOALS: Goals reviewed with patient? Yes  SHORT TERM GOALS: Target date: 01/13/24  Independent with initial HEP Baseline: Goal status: met 12/29/23   LONG TERM GOALS: Target date: 03/23/24  Understand posture and body mechanics Baseline:  Goal status: progressing 01/05/24  2.  Increase SLR to 75 degrees Baseline: 65 dehrees Goal status: progressing 01/05/24  3.  Decrease pain 50%  Baseline: pain at times up to 10/10 Goal status: INITIAL  4.  Report over 6 weeks no severe set backs Baseline:  Goal status: INITIAL  5.  Increase left LE strength to 4+/5 Baseline: 4-/5 Goal status: INITIAL  6.  Improve the ODI score to 20% Baseline: 40% Goal status: INITIAL  PLAN:  PT FREQUENCY: 1-2x/week  PT DURATION: 12 weeks  PLANNED INTERVENTIONS: 97164- PT Re-evaluation, 97110-Therapeutic exercises, 97530- Therapeutic activity, 97112- Neuromuscular re-education, 97535- Self Care, 60454- Manual therapy, G0283- Electrical stimulation (unattended), 09811- Traction (mechanical),  Patient/Family education, Taping, Dry Needling, Joint mobilization, Spinal mobilization, Cryotherapy, and Moist heat.  PLAN FOR NEXT SESSION: see how the progression of exercise goes   Madhav Mohon W, PT 01/05/2024, 1:57 PM

## 2024-01-07 ENCOUNTER — Encounter: Payer: Self-pay | Admitting: Physical Therapy

## 2024-01-12 ENCOUNTER — Ambulatory Visit: Attending: Neurosurgery | Admitting: Physical Therapy

## 2024-01-12 ENCOUNTER — Encounter: Payer: Self-pay | Admitting: Physical Therapy

## 2024-01-12 DIAGNOSIS — M5459 Other low back pain: Secondary | ICD-10-CM | POA: Diagnosis present

## 2024-01-12 DIAGNOSIS — M62830 Muscle spasm of back: Secondary | ICD-10-CM

## 2024-01-12 NOTE — Therapy (Signed)
 OUTPATIENT PHYSICAL THERAPY THORACOLUMBAR TREATMENT   Patient Name: Daniel BASTOS, MD MRN: [id number] DOB:10/04/1961, 63 y.o., male Today's Date: 01/12/2024  END OF SESSION:  PT End of Session - 01/12/24 1529     Visit Number 4    Date for PT Re-Evaluation 03/23/24    Authorization Type [place] [employer]    PT Start Time 1530    PT Stop Time 1615    PT Time Calculation (min) 45 min    Activity Tolerance Patient tolerated treatment well    Behavior During Therapy [facility] for tasks assessed/performed                Past Medical History:  Diagnosis Date   Anxiety state, unspecified    Atypical mole 2008   Complication of anesthesia    had some difficulty voiding   Dermatitis    anal dermatitis   Hemorrhoids    Herpes simplex without mention of complication    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 08/2009   Patient Active Problem List   Diagnosis Date Noted   Dependence on CPAP ventilation 03/27/2023   Abnormal transaminases 08/08/2019   Viral respiratory illness 12/24/2018   Preventative health care 08/22/2018   Tear of LCL (lateral collateral ligament) of knee, left, initial encounter 08/18/2017   Lumbar radiculopathy 01/20/2017   Paresthesia 08/18/2016   Low serum testosterone 12/16/2015   Metatarsalgia of left foot 09/13/2013   OSA on CPAP 09/30/2011   Hypogonadism male 07/06/2011   Herpes simplex virus (HSV) infection  12/11/2010   Anxiety state 12/11/2010   Asthma 12/11/2010   SEMINOMA 12/11/2009    PCP: Laury Axon, MD  REFERRING PROVIDER: Myer Haff, MD  REFERRING DIAG: Lumbar radiculopathy  Rationale for Evaluation and Treatment: Rehabilitation  THERAPY DIAG:  Other low back pain  Muscle spasm of back  ONSET DATE: 08/25/23  SUBJECTIVE:  SUBJECTIVE STATEMENT: Patient reports that after the last treatment he had an increase of pain in the back, pain was bad for a full day  PERTINENT HISTORY:  See above  PAIN:  Are you having pain? Yes: NPRS scale: 4/10 Pain location: low back left > right Pain description: sharp and shooting, numbness left lateral foot Aggravating factors: twisting, standing, walking 15 minutes pain up to 10/10 Relieving factors: ibuprofen, support pain at best a 2/10  PRECAUTIONS: None  RED FLAGS: None   WEIGHT BEARING RESTRICTIONS: No  FALLS:  Has patient fallen in last 6 months? No  LIVING ENVIRONMENT: Lives with: lives with their family Lives in: House/apartment Stairs: Yes: Internal: 16 steps; can reach both Has following equipment at home: None  OCCUPATION: MD, on feet a lot  PLOF: Independent and hiking up to 6 hours, gym 2-3x/week  PATIENT GOALS: bike, hike, exercise  NEXT MD VISIT: not schedueled  OBJECTIVE:  Note: Objective measures were completed at Evaluation unless otherwise noted.  DIAGNOSTIC FINDINGS:  IMPRESSION: Mild multilevel lumbar spine DDD, worse at L1-L2 and L2-L3.  IMPRESSION: 1. Interval resolution of the previously seen right subarticular disc extrusion at L5-S1. Unchanged severe left and moderate right neural foraminal narrowing at this level. 2. Moderate bilateral neural foraminal narrowing at L4-5, slightly worse on the right. 3.  Moderate right neural foraminal narrowing at L3-4.  PATIENT SURVEYS:  Modified Oswestry 20/50 = 40%   COGNITION: Overall cognitive status: Within functional limits for tasks assessed     SENSATION: WFL  MUSCLE LENGTH: Tight HS SLR 60 degrees, tight and tender piriformis, hip flexors mild tightness  POSTURE: rounded shoulders, forward head, and decreased lumbar lordosis  PALPATION: Tight but non tender  LUMBAR ROM:   AROM eval  Flexion Decreased 25%  Extension Decreased 25%  Right lateral flexion Decreased 25%  Left lateral flexion Decreased 25%  Right rotation   Left rotation    (Blank rows = not tested)  LOWER EXTREMITY ROM:   WFL's  LOWER EXTREMITY MMT:    MMT Right eval Left eval Left  01/05/24  Hip flexion  4- 4-  Hip extension     Hip abduction  4- 4+  Hip adduction     Hip internal rotation     Hip external rotation     Knee flexion  4- 4+  Knee extension  4- 4+  Ankle dorsiflexion  4 4+  Ankle plantarflexion     Ankle inversion     Ankle eversion      (Blank rows = not tested)  LUMBAR SPECIAL TESTS:  Straight leg raise test: Positive and Slump test: Positive  SLR positive on the left at 65 degrees GAIT: Distance walked: 100 feet Assistive device utilized: None Level of assistance: Complete Independence Comments: mild antalgic ont he left, does report at times that he has his left foot drag  TREATMENT DATE:  01/12/24 Reviewed and tried to problem solve what we did last time and what may have caused the increase of pain, we felt it was the walking or the exercises that was causing a rotational force, even though we were not rotating. Performed passive K2C Passive LE stretches Static lumbar traction 75# Had him get up the correct way  01/04/26 Gait outside with two canes but did not need to use, brisk pace around the back building, no increase of pain 35# seated row 35# lats Passive LE stretches Feet on ball K2C, rotation, bridges, isometric  abs Static lumbar traction 93#  12/29/23 Nustep level 5 x 5 minutes 25# lats 25# rows cues for posture 10# straight arm pulls cues for core 20# AR press Red tband row, extension and lats Green tband hip extension and abduction Passive stretch LE's Static lumbar traction  12/22/23 Evaluation Static lumbar traction 93#                                                                                                                                  PATIENT EDUCATION:  Education details: HEP/POC Person educated: Patient Education method: Explanation, Demonstration, Tactile cues, Verbal cues, and Handouts Education comprehension: verbalized understanding  HOME EXERCISE PROGRAM: Access Code: [email or web address] URL: [email or web address] Date: 12/22/2023 Prepared by: Stacie Glaze  Exercises - Seated Table Hamstring Stretch  - 1 x daily - 7 x weekly - 1 sets - 5 reps - 30 hold - Supine Piriformis Stretch Pulling Heel to Hip  - 1 x daily - 7 x weekly - 1 sets - 5 reps - 30 hold  Access Code: [email or web address] URL: [email or web address] Date: 12/29/2023 Prepared by: Stacie Glaze  Exercises - Standing Shoulder Row with Anchored Resistance  - 1 x daily - 7 x weekly - 3 sets - 10 reps - 3 hold - Shoulder extension with resistance - Neutral  - 1 x daily - 7 x weekly - 3 sets - 10 reps - 3 hold - Staggered Lat Pull Down with Resistance - Elbows Bent  - 1 x daily - 7 x weekly - 3 sets - 10 reps - 3 hold - Standing Anti-Rotation Press with Anchored Resistance  - 1 x daily - 7 x weekly - 3 sets - 10 reps - 3 hold - Standing Repeated Hip Extension with Resistance  - 1 x daily - 7 x weekly - 3 sets - 10 reps - 3 hold - Standing Repeated Hip Abduction with Resistance  - 1 x daily - 7 x weekly - 3 sets - 10 reps - 3 hold ASSESSMENT:  CLINICAL IMPRESSION: Patient had increased LBP after our last visit trying to think if it was the AR press, the farmer carry or the hip  exercises, WE did mostly passive stretching and some education on the body mechanics for in and out of bed and the car.  We also did traction but with less weight  Patient is a 63 y.o. male who was seen today for physical therapy evaluation and treatment for Lumbar radiculopathy.   He reports that he has had issues with his back for a number of years, he reports that he is worse the past year and has had more severe episodes of pain a 10/10 over the past 6 months,   OBJECTIVE IMPAIRMENTS: Abnormal gait, cardiopulmonary status limiting activity, decreased activity tolerance, decreased endurance, decreased mobility, difficulty walking, decreased ROM, decreased strength, increased muscle spasms, impaired flexibility, improper body mechanics, postural dysfunction, and pain.   REHAB POTENTIAL: Good  CLINICAL DECISION MAKING: Stable/uncomplicated  EVALUATION COMPLEXITY: Low   GOALS: Goals reviewed with patient? Yes  SHORT TERM GOALS: Target date: 01/13/24  Independent with initial HEP Baseline: Goal status: met 12/29/23   LONG TERM GOALS: Target date: 03/23/24  Understand posture and body mechanics Baseline:  Goal status: progressing 01/05/24  2.  Increase SLR to 75 degrees Baseline: 65 dehrees Goal status: progressing 01/05/24  3.  Decrease pain 50%  Baseline: pain at times up to 10/10 Goal status: ongoing 01/12/24  4.  Report over 6 weeks no severe set backs Baseline:  Goal status: ongoing 01/12/24  5.  Increase left LE strength to 4+/5 Baseline: 4-/5 Goal status: INITIAL  6.  Improve the ODI score to 20% Baseline: 40% Goal status: INITIAL  PLAN:  PT FREQUENCY: 1-2x/week  PT DURATION: 12 weeks  PLANNED INTERVENTIONS: 97164- PT Re-evaluation, 97110-Therapeutic exercises, 97530- Therapeutic activity, 97112- Neuromuscular re-education, 97535- Self Care, 25366- Manual therapy, G0283- Electrical stimulation (unattended), 44034- Traction (mechanical), Patient/Family education,  Taping, Dry Needling, Joint mobilization, Spinal mobilization, Cryotherapy, and Moist heat.  PLAN FOR NEXT SESSION: see how he is doing  Jearld Lesch, PT 01/12/2024, 3:30 PM

## 2024-01-19 ENCOUNTER — Encounter: Payer: Self-pay | Admitting: Physical Therapy

## 2024-01-19 ENCOUNTER — Ambulatory Visit: Admitting: Physical Therapy

## 2024-01-19 DIAGNOSIS — M62830 Muscle spasm of back: Secondary | ICD-10-CM

## 2024-01-19 DIAGNOSIS — M5459 Other low back pain: Secondary | ICD-10-CM | POA: Diagnosis not present

## 2024-01-19 NOTE — Therapy (Signed)
 OUTPATIENT PHYSICAL THERAPY THORACOLUMBAR TREATMENT   Patient Name: Daniel DRAPEAU, MD MRN: [id number] DOB:1961-07-10, 63 y.o., male Today's Date: 01/19/2024  END OF SESSION:  PT End of Session - 01/19/24 0933     Visit Number 5    Date for PT Re-Evaluation 03/23/24    Authorization Type  [employer]    PT Start Time [phone]    PT Stop Time 1026    PT Time Calculation (min) 55 min    Behavior During Therapy [facility] for tasks assessed/performed                Past Medical History:  Diagnosis Date   Anxiety state, unspecified    Atypical mole 2008   Complication of anesthesia    had some difficulty voiding   Dermatitis    anal dermatitis   Hemorrhoids    Herpes simplex without mention of complication    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 08/2009   Patient Active Problem List   Diagnosis Date Noted   Dependence on CPAP ventilation 03/27/2023   Abnormal transaminases 08/08/2019   Viral respiratory illness 12/24/2018   Preventative health care 08/22/2018   Tear of LCL (lateral collateral ligament) of knee, left, initial encounter 08/18/2017   Lumbar radiculopathy 01/20/2017   Paresthesia 08/18/2016   Low serum testosterone 12/16/2015   Metatarsalgia of left foot 09/13/2013   OSA on CPAP 09/30/2011   Hypogonadism male 07/06/2011   Herpes simplex virus (HSV) infection 12/11/2010   Anxiety state 12/11/2010   Asthma 12/11/2010    SEMINOMA 12/11/2009    PCP: Laury Axon, MD  REFERRING PROVIDER: Myer Haff, MD  REFERRING DIAG: Lumbar radiculopathy  Rationale for Evaluation and Treatment: Rehabilitation  THERAPY DIAG:  Other low back pain  Muscle spasm of back  ONSET DATE: 08/25/23  SUBJECTIVE:  SUBJECTIVE STATEMENT: Reports doing okay after last visit, no increase of pain, was able to walk 1.5 miles with sticks but did have an increase of LBP up to 4/10, with left leg and buttock  PERTINENT HISTORY:  See above  PAIN:  Are you having pain? Yes: NPRS scale: 4/10 Pain location: low back left > right Pain description: sharp and shooting, numbness left lateral foot Aggravating factors: twisting, standing, walking 15 minutes pain up to 10/10 Relieving factors: ibuprofen, support pain at best a 2/10  PRECAUTIONS: None  RED FLAGS: None   WEIGHT BEARING RESTRICTIONS: No  FALLS:  Has patient fallen in last 6 months? No  LIVING ENVIRONMENT: Lives with: lives with their family Lives in: House/apartment Stairs: Yes: Internal: 16 steps; can reach both Has following equipment at home: None  OCCUPATION: MD, on feet a lot  PLOF: Independent and hiking up to 6 hours, gym 2-3x/week  PATIENT GOALS: bike, hike, exercise  NEXT MD VISIT: not schedueled  OBJECTIVE:  Note: Objective measures were completed at Evaluation unless otherwise noted.  DIAGNOSTIC FINDINGS:  IMPRESSION: Mild multilevel lumbar spine DDD, worse at L1-L2 and L2-L3.  IMPRESSION: 1. Interval resolution of the previously seen right subarticular disc extrusion at L5-S1. Unchanged severe left and moderate right neural foraminal narrowing at this level. 2. Moderate bilateral neural foraminal narrowing at L4-5, slightly worse on the right. 3. Moderate right  neural foraminal narrowing at L3-4.  PATIENT SURVEYS:  Modified Oswestry 20/50 = 40%   COGNITION: Overall cognitive status: Within functional limits for tasks assessed     SENSATION: WFL  MUSCLE LENGTH: Tight HS SLR 60 degrees, tight and tender piriformis, hip flexors mild tightness  POSTURE: rounded shoulders, forward head, and decreased lumbar lordosis  PALPATION: Tight but non tender  LUMBAR ROM:   AROM eval  Flexion Decreased 25%  Extension Decreased 25%  Right lateral flexion Decreased 25%  Left lateral flexion Decreased 25%  Right rotation   Left rotation    (Blank rows = not tested)  LOWER EXTREMITY ROM:   WFL's  LOWER EXTREMITY MMT:    MMT Right eval Left eval Left  01/05/24  Hip flexion  4- 4-  Hip extension     Hip abduction  4- 4+  Hip adduction     Hip internal rotation     Hip external rotation     Knee flexion  4- 4+  Knee extension  4- 4+  Ankle dorsiflexion  4 4+  Ankle plantarflexion     Ankle inversion     Ankle eversion      (Blank rows = not tested)  LUMBAR SPECIAL TESTS:  Straight leg raise test: Positive and Slump test: Positive  SLR positive on the left at 65 degrees GAIT: Distance walked: 100 feet Assistive device utilized: None Level of assistance: Complete Independence Comments: mild antalgic ont he left, does report at times that he has his left foot drag  TREATMENT DATE:  01/19/24 Bike level 5 x 5 minutes 25# row 2x10 25# Lats 2x10 Leg press 40# 2x10 Feet on ball K2C, very small rotation, isometric abs Passive stretch LE's, some neural flossing Static lumbar traction 75#  01/12/24 Reviewed and tried to problem solve what we did last time and what may have caused the increase of pain, we felt it was the walking or the exercises that was causing a rotational force, even though we were not rotating. Performed passive K2C Passive LE stretches Static lumbar traction 75# Had him get up  the correct way  01/04/26 Gait  outside with two canes but did not need to use, brisk pace around the back building, no increase of pain 35# seated row 35# lats Passive LE stretches Feet on ball K2C, rotation, bridges, isometric abs Static lumbar traction 93#  12/29/23 Nustep level 5 x 5 minutes 25# lats 25# rows cues for posture 10# straight arm pulls cues for core 20# AR press Red tband row, extension and lats Green tband hip extension and abduction Passive stretch LE's Static lumbar traction  12/22/23 Evaluation Static lumbar traction 93#                                                                                                                                  PATIENT EDUCATION:  Education details: HEP/POC Person educated: Patient Education method: Explanation, Demonstration, Tactile cues, Verbal cues, and Handouts Education comprehension: verbalized understanding  HOME EXERCISE PROGRAM: Access Code: [email or web address] URL: [email or web address] Date: 12/22/2023 Prepared by: Stacie Glaze  Exercises - Seated Table Hamstring Stretch  - 1 x daily - 7 x weekly - 1 sets - 5 reps - 30 hold - Supine Piriformis Stretch Pulling Heel to Hip  - 1 x daily - 7 x weekly - 1 sets - 5 reps - 30 hold  Access Code: [email or web address] URL: [email or web address] Date: 12/29/2023 Prepared by: Stacie Glaze  Exercises - Standing Shoulder Row with Anchored Resistance  - 1 x daily - 7 x weekly - 3 sets - 10 reps - 3 hold - Shoulder extension with resistance - Neutral  - 1 x daily - 7 x weekly - 3 sets - 10 reps - 3 hold - Staggered Lat Pull Down with Resistance - Elbows Bent  - 1 x daily - 7 x weekly - 3 sets - 10 reps - 3 hold - Standing Anti-Rotation Press with Anchored Resistance  - 1 x daily - 7 x weekly - 3 sets - 10 reps - 3 hold - Standing Repeated Hip Extension with Resistance  - 1 x daily - 7 x weekly - 3 sets - 10 reps - 3 hold - Standing Repeated Hip Abduction with Resistance  - 1 x daily  - 7 x weekly - 3 sets - 10 reps - 3 hold ASSESSMENT:  CLINICAL IMPRESSION: Patient did better after the last visit, I added back some exercises but avoided rotation and extension. WE continued passive stretching and some education on the body mechanics for in and out of bed and the car, lifting, golfers lift and listening to patients lungs.  We also did traction but with less weight 75#  Patient is a 63 y.o. male who was seen today for physical therapy evaluation and treatment for Lumbar radiculopathy.   He reports that he has had issues with his back for a number of years, he reports that he is worse the past year and has had more severe episodes of pain a 10/10 over the  past 6 months,   OBJECTIVE IMPAIRMENTS: Abnormal gait, cardiopulmonary status limiting activity, decreased activity tolerance, decreased endurance, decreased mobility, difficulty walking, decreased ROM, decreased strength, increased muscle spasms, impaired flexibility, improper body mechanics, postural dysfunction, and pain.   REHAB POTENTIAL: Good  CLINICAL DECISION MAKING: Stable/uncomplicated  EVALUATION COMPLEXITY: Low   GOALS: Goals reviewed with patient? Yes  SHORT TERM GOALS: Target date: 01/13/24  Independent with initial HEP Baseline: Goal status: met 12/29/23   LONG TERM GOALS: Target date: 03/23/24  Understand posture and body mechanics Baseline:  Goal status: progressing 01/05/24  2.  Increase SLR to 75 degrees Baseline: 65 dehrees Goal status: progressing 01/19/24  3.  Decrease pain 50%  Baseline: pain at times up to 10/10 Goal status: ongoing 01/12/24  4.  Report over 6 weeks no severe set backs Baseline:  Goal status: ongoing 01/12/24  5.  Increase left LE strength to 4+/5 Baseline: 4-/5 Goal status: ongoing 01/19/24  6.  Improve the ODI score to 20% Baseline: 40% Goal status: ongoing 01/19/24  PLAN:  PT FREQUENCY: 1-2x/week  PT DURATION: 12 weeks  PLANNED INTERVENTIONS: 97164- PT  Re-evaluation, 97110-Therapeutic exercises, 97530- Therapeutic activity, 97112- Neuromuscular re-education, 97535- Self Care, 16109- Manual therapy, G0283- Electrical stimulation (unattended), 60454- Traction (mechanical), Patient/Family education, Taping, Dry Needling, Joint mobilization, Spinal mobilization, Cryotherapy, and Moist heat.  PLAN FOR NEXT SESSION: see how he is doing advance as able  Jearld Lesch, PT 01/19/2024, 10:13 AM

## 2024-01-27 DIAGNOSIS — G4733 Obstructive sleep apnea (adult) (pediatric): Secondary | ICD-10-CM | POA: Diagnosis not present

## 2024-02-09 ENCOUNTER — Ambulatory Visit: Attending: Neurosurgery | Admitting: Physical Therapy

## 2024-02-09 ENCOUNTER — Encounter: Payer: Self-pay | Admitting: Physical Therapy

## 2024-02-09 DIAGNOSIS — M62830 Muscle spasm of back: Secondary | ICD-10-CM | POA: Insufficient documentation

## 2024-02-09 DIAGNOSIS — M5459 Other low back pain: Secondary | ICD-10-CM | POA: Insufficient documentation

## 2024-02-09 NOTE — Therapy (Signed)
 OUTPATIENT PHYSICAL THERAPY THORACOLUMBAR TREATMENT   Patient Name: Daniel MATHENY, Daniel Lam MRN: [id number] DOB:Sep 02, 1961, 63 y.o., male Today's Date: 02/09/2024  END OF SESSION:  PT End of Session - 02/09/24 1358     Visit Number 6    Date for PT Re-Evaluation 03/23/24    Authorization Type [place] [employer]    PT Start Time 1358    PT Stop Time 1443    PT Time Calculation (min) 45 min    Activity Tolerance Patient tolerated treatment well    Behavior During Therapy [facility] for tasks assessed/performed                Past Medical History:  Diagnosis Date   Anxiety state, unspecified    Atypical mole 2008   Complication of anesthesia    had some difficulty voiding   Dermatitis    anal dermatitis   Hemorrhoids    Herpes simplex without mention of complication    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 08/2009   Patient Active Problem List   Diagnosis Date Noted   Dependence on CPAP ventilation 03/27/2023   Abnormal transaminases 08/08/2019   Viral respiratory illness 12/24/2018   Preventative health care 08/22/2018   Tear of LCL (lateral collateral ligament) of knee, left, initial encounter 08/18/2017   Lumbar radiculopathy 01/20/2017   Paresthesia 08/18/2016   Low serum testosterone  12/16/2015   Metatarsalgia of left foot 09/13/2013   OSA on CPAP 09/30/2011   Hypogonadism male 07/06/2011   Herpes simplex virus (HSV) infection  12/11/2010   Anxiety state 12/11/2010   Asthma 12/11/2010   SEMINOMA 12/11/2009    PCP: Jalene Mayor, Daniel Lam  REFERRING PROVIDER: Mont Antis, Daniel Lam  REFERRING DIAG: Lumbar radiculopathy  Rationale for Evaluation and Treatment: Rehabilitation  THERAPY DIAG:  Other low back pain  Muscle spasm of back  ONSET DATE: 08/25/23  SUBJECTIVE:  SUBJECTIVE STATEMENT: Reports pain is daily a 3/10 and limits his activity, especially standing and walking, also has pain after trying to do wood working  PERTINENT HISTORY:  See above  PAIN:  Are you having pain? Yes: NPRS scale: 4/10 Pain location: low back left > right Pain description: sharp and shooting, numbness left lateral foot Aggravating factors: twisting, standing, walking 15 minutes pain up to 10/10 Relieving factors: ibuprofen , support pain at best a 2/10  PRECAUTIONS: None  RED FLAGS: None   WEIGHT BEARING RESTRICTIONS: No  FALLS:  Has patient fallen in last 6 months? No  LIVING ENVIRONMENT: Lives with: lives with their family Lives in: House/apartment Stairs: Yes: Internal: 16 steps; can reach both Has following equipment at home: None  OCCUPATION: Daniel Lam, on feet a lot  PLOF: Independent and hiking up to 6 hours, gym 2-3x/week  PATIENT GOALS: bike, hike, exercise  NEXT Daniel Lam VISIT: not schedueled  OBJECTIVE:  Note: Objective measures were completed at Evaluation unless otherwise noted.  DIAGNOSTIC FINDINGS:  IMPRESSION: Mild multilevel lumbar spine DDD, worse at L1-L2 and L2-L3.  IMPRESSION: 1. Interval resolution of the previously seen right subarticular disc extrusion at L5-S1. Unchanged severe left and moderate right neural foraminal narrowing at this level. 2. Moderate bilateral neural foraminal narrowing at L4-5, slightly worse on the  right. 3. Moderate right neural foraminal narrowing at L3-4.  PATIENT SURVEYS:  Modified Oswestry 20/50 = 40%   COGNITION: Overall cognitive status: Within functional limits for tasks assessed     SENSATION: WFL  MUSCLE LENGTH: Tight HS SLR 60 degrees, tight and tender piriformis, hip flexors mild tightness  POSTURE: rounded shoulders, forward head, and decreased lumbar lordosis  PALPATION: Tight but non tender  LUMBAR ROM:   AROM eval  Flexion Decreased 25%  Extension Decreased 25%  Right lateral flexion Decreased 25%  Left lateral flexion Decreased 25%  Right rotation   Left rotation    (Blank rows = not tested)  LOWER EXTREMITY ROM:   WFL's  LOWER EXTREMITY MMT:    MMT Right eval Left eval Left  01/05/24 Left  02/09/24  Hip flexion  4- 4- 4+  Hip extension      Hip abduction  4- 4+ 4+  Hip adduction      Hip internal rotation      Hip external rotation      Knee flexion  4- 4+ 5  Knee extension  4- 4+ 4+  Ankle dorsiflexion  4 4+ 4+  Ankle plantarflexion      Ankle inversion      Ankle eversion       (Blank rows = not tested)  LUMBAR SPECIAL TESTS:  Straight leg raise test: Positive and Slump test: Positive  SLR positive on the left at 65 degrees GAIT: Distance walked: 100 feet Assistive device utilized: None Level of assistance: Complete Independence Comments: mild antalgic ont he left, does report at times that he has his left foot drag  TREATMENT DATE:  02/09/24 Reviewed HEP Reviewed posture and body mechanics Passive stretch of the LE's STM with the T-gun to the left buttocks and the left lumbar area Discussion of surgery and what we have both seen and experienced with our patients Static pelvic traction 80 #   01/19/24 Bike level 5 x 5 minutes 25# row 2x10 25# Lats 2x10 Leg press 40# 2x10 Feet on ball K2C, very small rotation, isometric abs Passive stretch LE's, some neural flossing Static lumbar traction 75#  01/12/24 Reviewed and  tried to problem solve what we did last time and what may have caused the increase of pain, we felt it was the walking or the exercises that was causing a rotational force, even though we were not rotating. Performed passive K2C Passive LE stretches Static lumbar traction 75# Had him get up the correct way  01/04/26 Gait outside with two canes but did not need to use, brisk pace around the back building, no increase of pain 35# seated row 35# lats Passive LE stretches Feet on ball K2C, rotation, bridges, isometric abs Static lumbar traction 93#  12/29/23 Nustep level 5 x 5 minutes 25# lats 25# rows cues for posture 10# straight arm pulls cues for core 20# AR press Red tband row, extension and lats Green tband hip extension and abduction Passive stretch LE's Static lumbar traction  12/22/23 Evaluation Static lumbar traction 93#                                                                                                                                  PATIENT EDUCATION:  Education details: HEP/POC Person educated: Patient Education method: Explanation, Demonstration, Tactile cues, Verbal cues, and Handouts Education comprehension: verbalized understanding  HOME EXERCISE PROGRAM: Access Code: [email or web address] URL: [email or web address] Date: 12/22/2023 Prepared by: Cherylene Corrente  Exercises - Seated Table Hamstring Stretch  - 1 x daily - 7 x weekly - 1 sets - 5 reps - 30 hold - Supine Piriformis Stretch Pulling Heel to Hip  - 1 x daily - 7 x weekly - 1 sets - 5 reps - 30 hold  Access Code: [email or web address] URL: [email or web address] Date: 12/29/2023 Prepared by: Cherylene Corrente  Exercises - Standing Shoulder Row with Anchored Resistance  - 1 x daily - 7 x weekly - 3 sets - 10 reps - 3 hold - Shoulder extension with resistance - Neutral  - 1 x daily - 7 x weekly - 3 sets - 10 reps - 3 hold - Staggered Lat Pull Down with Resistance - Elbows Bent  - 1  x daily - 7 x weekly - 3 sets - 10 reps - 3 hold - Standing Anti-Rotation Press with Anchored Resistance  - 1 x daily - 7 x weekly - 3 sets - 10 reps - 3 hold - Standing Repeated Hip Extension with Resistance  - 1 x daily - 7 x weekly - 3 sets - 10 reps - 3 hold - Standing Repeated Hip Abduction with Resistance  - 1 x daily - 7 x weekly - 3 sets - 10 reps - 3 hold ASSESSMENT:  CLINICAL IMPRESSION: WE had a long discussion regarding the stretching and the posture and body mechanics to help with decreasing stress in the low back, we talked about a T-gun for home use.  I did test his MMT and he is stronger on the left than when we started  Patient is a 63 y.o. male who was seen today for physical  therapy evaluation and treatment for Lumbar radiculopathy.   He reports that he has had issues with his back for a number of years, he reports that he is worse the past year and has had more severe episodes of pain a 10/10 over the past 6 months,   OBJECTIVE IMPAIRMENTS: Abnormal gait, cardiopulmonary status limiting activity, decreased activity tolerance, decreased endurance, decreased mobility, difficulty walking, decreased ROM, decreased strength, increased muscle spasms, impaired flexibility, improper body mechanics, postural dysfunction, and pain.   REHAB POTENTIAL: Good  CLINICAL DECISION MAKING: Stable/uncomplicated  EVALUATION COMPLEXITY: Low   GOALS: Goals reviewed with patient? Yes  SHORT TERM GOALS: Target date: 01/13/24  Independent with initial HEP Baseline: Goal status: met 12/29/23   LONG TERM GOALS: Target date: 03/23/24  Understand posture and body mechanics Baseline:  Goal status: met 02/09/24  2.  Increase SLR to 75 degrees Baseline: 65 dehrees Goal status: progressing 01/19/24  3.  Decrease pain 50%  Baseline: pain at times up to 10/10 Goal status: ongoing 02/09/24  4.  Report over 6 weeks no severe set backs Baseline:  Goal status: not met 02/09/24  5.  Increase left LE  strength to 4+/5 Baseline: 4-/5 Goal status: met 02/09/24  6.  Improve the ODI score to 20% Baseline: 40% Goal status: met 02/09/24  PLAN:  PT FREQUENCY: 1-2x/week  PT DURATION: 12 weeks  PLANNED INTERVENTIONS: 97164- PT Re-evaluation, 97110-Therapeutic exercises, 97530- Therapeutic activity, 97112- Neuromuscular re-education, 97535- Self Care, 08657- Manual therapy, G0283- Electrical stimulation (unattended), 84696- Traction (mechanical), Patient/Family education, Taping, Dry Needling, Joint mobilization, Spinal mobilization, Cryotherapy, and Moist heat.  PLAN FOR NEXT SESSION: will d/c with him doing HEP and watching posture and body mechanics, he will contact the surgeon.  Hollis Lurie, PT 02/09/2024, 2:05 PM

## 2024-02-23 ENCOUNTER — Telehealth: Payer: Self-pay

## 2024-02-23 NOTE — Telephone Encounter (Signed)
 Dr Neomi Banks contacted our office to notify us  that he has completed physical therapy. He stated it did not help, and made things worse. I reiterated that Dr Chrystal Crape last note mentions he would have to be off cigars for 30 days prior to surgery.   He states he is thinking about surgery, but would likely want to do it later in the year to enjoy summer. He inquired about how long his physical therapy and imaging would be valid for. I explained that with [employer]'s policy for spine surgery, his physical therapy is valid for 12 months. He would like to know what time frame Dr Mont Antis would require updated imaging. His MRI was done 08/27/23 and his xray was done 11/11/23.  He would like a mychart message from us  once I ask Dr Mont Antis about the time frame for when he would require updated imaging prior to surgery.

## 2024-03-15 ENCOUNTER — Ambulatory Visit: Admitting: Family Medicine

## 2024-03-15 ENCOUNTER — Encounter: Payer: Self-pay | Admitting: Family Medicine

## 2024-03-15 VITALS — BP 108/70 | HR 84 | Temp 98.5°F | Resp 18 | Ht 70.0 in | Wt 218.0 lb

## 2024-03-15 DIAGNOSIS — M5416 Radiculopathy, lumbar region: Secondary | ICD-10-CM | POA: Diagnosis not present

## 2024-03-15 DIAGNOSIS — E538 Deficiency of other specified B group vitamins: Secondary | ICD-10-CM | POA: Diagnosis not present

## 2024-03-15 DIAGNOSIS — E559 Vitamin D deficiency, unspecified: Secondary | ICD-10-CM | POA: Diagnosis not present

## 2024-03-15 DIAGNOSIS — G8929 Other chronic pain: Secondary | ICD-10-CM | POA: Diagnosis not present

## 2024-03-15 DIAGNOSIS — Z0000 Encounter for general adult medical examination without abnormal findings: Secondary | ICD-10-CM

## 2024-03-15 DIAGNOSIS — Z125 Encounter for screening for malignant neoplasm of prostate: Secondary | ICD-10-CM

## 2024-03-15 DIAGNOSIS — M5441 Lumbago with sciatica, right side: Secondary | ICD-10-CM

## 2024-03-15 DIAGNOSIS — J4520 Mild intermittent asthma, uncomplicated: Secondary | ICD-10-CM | POA: Diagnosis not present

## 2024-03-15 DIAGNOSIS — M5442 Lumbago with sciatica, left side: Secondary | ICD-10-CM

## 2024-03-15 LAB — CBC WITH DIFFERENTIAL/PLATELET
Basophils Absolute: 0 10*3/uL (ref 0.0–0.1)
Basophils Relative: 0.6 % (ref 0.0–3.0)
Eosinophils Absolute: 0.1 10*3/uL (ref 0.0–0.7)
Eosinophils Relative: 2.1 % (ref 0.0–5.0)
HCT: 44.1 % (ref 39.0–52.0)
Hemoglobin: 14.9 g/dL (ref 13.0–17.0)
Lymphocytes Relative: 19.8 % (ref 12.0–46.0)
Lymphs Abs: 0.9 10*3/uL (ref 0.7–4.0)
MCHC: 33.9 g/dL (ref 30.0–36.0)
MCV: 96.2 fl (ref 78.0–100.0)
Monocytes Absolute: 0.4 10*3/uL (ref 0.1–1.0)
Monocytes Relative: 8.5 % (ref 3.0–12.0)
Neutro Abs: 3.1 10*3/uL (ref 1.4–7.7)
Neutrophils Relative %: 69 % (ref 43.0–77.0)
Platelets: 201 10*3/uL (ref 150.0–400.0)
RBC: 4.58 Mil/uL (ref 4.22–5.81)
RDW: 12.9 % (ref 11.5–15.5)
WBC: 4.5 10*3/uL (ref 4.0–10.5)

## 2024-03-15 LAB — PSA: PSA: 0.76 ng/mL (ref 0.10–4.00)

## 2024-03-15 LAB — SEDIMENTATION RATE: Sed Rate: 2 mm/h (ref 0–20)

## 2024-03-15 LAB — TSH: TSH: 0.97 u[IU]/mL (ref 0.35–5.50)

## 2024-03-15 NOTE — Assessment & Plan Note (Signed)
 Ghm utd Check labs  See [facility] Health Maintenance  Topic Date Due   Pneumococcal Vaccine 45-61 Years old (1 of 2 - PCV) Never done   Zoster Vaccines- Shingrix (1 of 2) Never done   COVID-19 Vaccine (6 - 2024-25 season) 07/18/2023   INFLUENZA VACCINE  05/11/2024   Colonoscopy  09/02/2024   DTaP/Tdap/Td (2 - Td or Tdap) 08/22/2028   Hepatitis C Screening  Completed   HIV Screening  Completed   HPV VACCINES  Aged Out   Meningococcal B Vaccine  Aged Out

## 2024-03-15 NOTE — Assessment & Plan Note (Signed)
Stable with symbicort

## 2024-03-15 NOTE — Progress Notes (Addendum)
 Established Patient Office Visit  Subjective   Patient ID: Daniel FORBES Mech, MD, male    DOB: 1961-08-05  Age: 64 y.o. MRN: [id number]  Chief Complaint  Patient presents with   Annual Exam    Pt states not fasting     HPI Discussed the use of AI scribe software for clinical note transcription with the patient, who gave verbal consent to proceed.  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    His asthma, which was challenging last year, has improved with the use of Symbicort  twice daily. Last year was marked by frequent asthma exacerbations.  He has a history of testicular cancer and experiences intermittent urinary frequency and urgency. He has not seen his urologist recently and does not require intervention for his testicular cancer at this time.  He has persistent back pain and was scheduled for surgery but was advised to try physical therapy first. After six weeks of therapy without improvement, he postponed the surgery. He plans to reconsider surgery in October or November.  He reports knee pain and recently underwent an MRI. He is concerned about the possibility of needing surgery. He also mentions shoulder pain and general aches and pains.  He started semaglutide two weeks ago for weight management after reaching 224 pounds and has lost five pounds. He attributes weight gain to decreased exercise and increased appetite, partly due to Allegra for allergies. He is focusing on portion control and eating fruits and salads.  He experienced severe acid reflux last year after stopping pantoprazole , which took six months to resolve. He underwent an endoscopy and visited the ER due to symptom severity. His acid reflux is now under control.     Patient Active Problem List   Diagnosis Date Noted   Dependence on CPAP ventilation 03/27/2023   Abnormal transaminases 08/08/2019   Viral respiratory illness 12/24/2018    Preventative health care 08/22/2018   Tear of LCL (lateral collateral ligament) of knee, left, initial encounter 08/18/2017   Lumbar radiculopathy 01/20/2017   Paresthesia 08/18/2016   Low serum testosterone  12/16/2015   Metatarsalgia of left foot 09/13/2013   OSA on CPAP 09/30/2011   Hypogonadism male 07/06/2011   Herpes simplex virus (HSV) infection 12/11/2010   Anxiety state 12/11/2010   Asthma 12/11/2010   SEMINOMA 12/11/2009   Past Medical History:  Diagnosis Date   Anxiety state, unspecified    Atypical mole 2008   Complication of anesthesia    had some difficulty voiding   Dermatitis    anal dermatitis   Hemorrhoids    Herpes simplex without mention of complication    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 08/2009   Social History   Tobacco Use   Smoking status: Some Days    Current packs/day: 0.25    Types:  Cigarettes, Cigars   Smokeless tobacco: Never   Tobacco comments:    Occasional cigar  Vaping Use   Vaping status: Never Used  Substance Use Topics   Alcohol use: Yes    Alcohol/week: 0.0 standard drinks of alcohol    Comment: occ   Drug use: No   Social History   Socioeconomic History   Marital status: Married    Spouse name: Not on file   Number of children: 3   Years of education: MD   Highest education level: Not on file  Occupational History   Occupation: physician    Comment: internal medicine   Tobacco Use   Smoking status: Some Days    Current packs/day: 0.25    Types: Cigarettes, Cigars   Smokeless tobacco: Never   Tobacco comments:    Occasional cigar  Vaping Use   Vaping status: Never Used  Substance and Sexual Activity   Alcohol use: Yes    Alcohol/week: 0.0 standard drinks of alcohol    Comment: occ   Drug use: No   Sexual activity: Yes    Partners: Female  Other Topics Concern   Not on file  Social History Narrative   Regular exercise: yes, very active   Caffeine use: 2 cups of coffee and soda's daily.   Right-handed.   Social Drivers of Corporate investment banker Strain: Not on file  Food Insecurity: Not on file  Transportation Needs: Not on file  Physical Activity: Not on file  Stress: Not on file  Social Connections: Not on file  Intimate Partner Violence: Not on file   Family Status  Relation Name Status   Mother  Deceased   Father  Deceased   Brother  Alive   Neg Hx  (Not Specified)  No partnership data on file   Family History  Problem Relation Age of Onset   Sjogren's syndrome Mother    COPD Mother    Colon polyps Father    Dementia Father    Throat cancer Father    Melanoma Brother    Colon cancer Neg Hx    Esophageal cancer Neg Hx    Stomach cancer Neg Hx    Rectal cancer Neg Hx    Liver disease Neg Hx    Allergies  Allergen Reactions   Alpha-Gal Hives   Demerol [Meperidine Hcl]     Difficulty voiding   Food     Mammals- Alpha Gal Syndrome   Latex     REACTION: SOB, rash, itching   Morphine And Codeine     Difficulty voiding   Other Other (See Comments)    Mammals- Alpha Gal Syndrome      Review of Systems  Constitutional:  Negative for chills, fever and malaise/fatigue.  HENT:  Negative for congestion and hearing loss.   Eyes:  Negative for blurred vision and discharge.  Respiratory:  Negative for cough, sputum production and shortness of breath.   Cardiovascular:  Negative for chest pain, palpitations and leg  swelling.  Gastrointestinal:  Negative for abdominal pain, blood in stool, constipation, diarrhea, heartburn, nausea and vomiting.  Genitourinary:  Negative for dysuria, frequency, hematuria and urgency.  Musculoskeletal:  Negative for back pain, falls and myalgias.  Skin:  Negative for rash.  Neurological:  Negative for dizziness, sensory change, loss of consciousness, weakness and headaches.  Endo/Heme/Allergies:  Negative for environmental allergies. Does not bruise/bleed easily.  Psychiatric/Behavioral:  Negative for depression and suicidal ideas. The patient is not nervous/anxious and does  not have insomnia.       Objective:     BP 108/70 (BP Location: Left Arm, Patient Position: Sitting, Cuff Size: Large)   Pulse 84   Temp 98.5 F (36.9 C) (Oral)   Resp 18   Ht 5' 10 (1.778 m)   Wt 218 lb (98.9 kg)   SpO2 95%   BMI 31.28 kg/m  BP Readings from Last 3 Encounters:  03/15/24 108/70  11/24/23 125/81  11/24/23 118/76   Wt Readings from Last 3 Encounters:  03/15/24 218 lb (98.9 kg)  11/24/23 216 lb (98 kg)  09/01/23 220 lb (99.8 kg)   SpO2 Readings from Last 3 Encounters:  03/15/24 95%  11/24/23 95%  11/24/23 97%      Physical Exam Vitals and nursing note reviewed.  Constitutional:      General: He is not in acute distress.    Appearance: Normal appearance. He is well-developed.  HENT:     Head: Normocephalic and atraumatic.     Right Ear: Tympanic membrane, ear canal and external ear normal. There is no impacted cerumen.     Left Ear: Tympanic membrane, ear canal and external ear normal. There is no impacted cerumen.     Nose: Nose normal.     Mouth/Throat:     Mouth: Mucous membranes are moist.     Pharynx: Oropharynx is clear. No oropharyngeal exudate or posterior oropharyngeal erythema.  Eyes:     General: No scleral icterus.       Right eye: No discharge.        Left eye: No discharge.     Conjunctiva/sclera: Conjunctivae normal.     Pupils: Pupils are  equal, round, and reactive to light.  Neck:     Thyroid : No thyromegaly.     Vascular: No JVD.  Cardiovascular:     Rate and Rhythm: Normal rate and regular rhythm.     Heart sounds: Normal heart sounds. No murmur heard. Pulmonary:     Effort: Pulmonary effort is normal. No respiratory distress.     Breath sounds: Normal breath sounds.  Abdominal:     General: Bowel sounds are normal. There is no distension.     Palpations: Abdomen is soft. There is no mass.     Tenderness: There is no abdominal tenderness. There is no guarding or rebound.  Musculoskeletal:        General: Normal range of motion.     Cervical back: Normal range of motion and neck supple.     Right lower leg: No edema.     Left lower leg: No edema.  Lymphadenopathy:     Cervical: No cervical adenopathy.  Skin:    General: Skin is warm and dry.     Findings: No erythema or rash.  Neurological:     Mental Status: He is alert and oriented to person, place, and time.     Cranial Nerves: No cranial nerve deficit.     Motor: No abnormal muscle tone.     Deep Tendon Reflexes: Reflexes are normal and symmetric. Reflexes normal.  Psychiatric:        Mood and Affect: Mood normal.        Behavior: Behavior normal.        Thought Content: Thought content normal.        Judgment: Judgment normal.      No results found for any visits on 03/15/24.  Last CBC Lab Results  Component Value Date   WBC 7.8  07/24/2023   HGB 14.5 07/24/2023   HCT 41.7 07/24/2023   MCV 94.3 07/24/2023   MCH 32.8 07/24/2023   RDW 11.9 07/24/2023   PLT 220 07/24/2023   Last metabolic panel Lab Results  Component Value Date   GLUCOSE 100 (H) 07/24/2023   NA 139 07/24/2023   K 3.4 (L) 07/24/2023   CL 102 07/24/2023   CO2 30 07/24/2023   BUN 20 07/24/2023   CREATININE 1.05 07/24/2023   GFRNONAA >60 07/24/2023   CALCIUM 9.6 07/24/2023   PROT 6.9 11/15/2022   ALBUMIN 4.4 01/06/2023   LABGLOB 2.5 01/07/2021   AGRATIO 1.7 01/07/2021    BILITOT 0.5 11/15/2022   ALKPHOS 76 01/06/2023   AST 29 01/06/2023   ALT 39 01/06/2023   ANIONGAP 7 07/24/2023   Last lipids Lab Results  Component Value Date   CHOL 197 01/06/2023   HDL 62 01/06/2023   LDLCALC 118 01/06/2023   LDLDIRECT 119.0 11/27/2015   TRIG 94 01/06/2023   CHOLHDL 3 11/15/2022   Last hemoglobin A1c Lab Results  Component Value Date   HGBA1C 5.6 01/06/2023   Last thyroid  functions Lab Results  Component Value Date   TSH 1.32 01/06/2023   T4TOTAL 6.7 09/06/2017   Last vitamin D  Lab Results  Component Value Date   VD25OH 33.66 11/15/2022   Last vitamin B12 and Folate Lab Results  Component Value Date   VITAMINB12 256 11/15/2022   FOLATE 13.7 11/15/2022      The 10-year ASCVD risk score (Arnett DK, et al., 2019) is: 9.3%* (Cholesterol units were assumed)    Assessment & Plan:   Problem List Items Addressed This Visit       Unprioritized   Preventative health care - Primary   Ghm utd Check labs  See AVS Health Maintenance  Topic Date Due   Pneumococcal Vaccine 64-15 Years old (1 of 2 - PCV) Never done   Zoster Vaccines- Shingrix (1 of 2) Never done   COVID-19 Vaccine (6 - 2024-25 season) 07/18/2023   INFLUENZA VACCINE  05/11/2024   Colonoscopy  09/02/2024   DTaP/Tdap/Td (2 - Td or Tdap) 08/22/2028   Hepatitis C Screening  Completed   HIV Screening  Completed   HPV VACCINES  Aged Out   Meningococcal B Vaccine  Aged Out         Relevant Orders   Lipid panel   PSA   TSH   Comprehensive metabolic panel with GFR   CBC with Differential/Platelet   POCT Urinalysis Dipstick (Automated)   Lumbar radiculopathy   Waiting for surgery      Relevant Orders   POCT Urinalysis Dipstick (Automated)   Asthma   Stable with symbicort       Other Visit Diagnoses       Chronic bilateral low back pain with sciatica, sciatica laterality unspecified       Relevant Orders   Sedimentation rate   CRP High sensitivity     B12 deficiency        Relevant Orders   Vitamin B12     Vitamin D  deficiency       Relevant Orders   VITAMIN D  25 Hydroxy (Vit-D Deficiency, Fractures)     Assessment and Plan Assessment & Plan Back Pain   Chronic back pain persists due to a history of a ruptured disc, scoliosis, and previous discectomy. Despite being scheduled for surgery, insurance recommended physical therapy, which was ineffective. Surgery is being considered for October or November. Significant pain  continues, and an injection is being considered for relief. Statistically, disc surgery outcomes show no significant difference in two years with or without surgery unless pain is severe.  Obesity   He weighs 224 pounds and started semaglutide two weeks ago, resulting in a five-pound weight loss. Physical limitations reduce exercise, but he focuses on portion control and a diet rich in fruits and vegetables. He is aware of semaglutide's potential side effects, including nausea, and its possible effect on decreasing CRP levels, which is relevant due to ongoing aches and pains.  Asthma   Asthma is currently well-managed with Symbicort  taken twice daily.  Gastroesophageal Reflux Disease (GERD)   Severe acid reflux last year resolved after six months post-pantoprazole  discontinuation. Currently under control.  Testicular Cancer   No current intervention is needed for testicular cancer. He experiences intermittent urinary frequency and urgency, ongoing for years. A urinalysis and PSA level check are planned.  Allergic Rhinitis  / Allergic rhinitis is managed with frequent Allegra use, which increases appetite. Allergies are particularly severe this year.  General Health Maintenance   . Antihistamines for allergies increase appetite. Previous B12 and vitamin D  levels were low. Interested in checking sed rate and/or CRP due to aches and pains. Plans to check B12 and vitamin D  levels, monitor liver function tests, and continue  Crestor.  Follow-up   He needs to follow up on lab results and consider further management based on findings.      Return if symptoms worsen or fail to improve.    Camaria Gerald R Lowne Chase, DO

## 2024-03-15 NOTE — Patient Instructions (Signed)

## 2024-03-15 NOTE — Assessment & Plan Note (Signed)
Waiting for surgery

## 2024-03-16 LAB — URINALYSIS, ROUTINE W REFLEX MICROSCOPIC
Bilirubin Urine: NEGATIVE
Hgb urine dipstick: NEGATIVE
Ketones, ur: NEGATIVE
Leukocytes,Ua: NEGATIVE
Nitrite: NEGATIVE
Specific Gravity, Urine: 1.01 (ref 1.000–1.030)
Total Protein, Urine: NEGATIVE
Urine Glucose: NEGATIVE
Urobilinogen, UA: 0.2 (ref 0.0–1.0)
pH: 6.5 (ref 5.0–8.0)

## 2024-03-16 LAB — COMPREHENSIVE METABOLIC PANEL WITH GFR
ALT: 29 U/L (ref 0–53)
AST: 27 U/L (ref 0–37)
Albumin: 4.5 g/dL (ref 3.5–5.2)
Alkaline Phosphatase: 66 U/L (ref 39–117)
BUN: 16 mg/dL (ref 6–23)
CO2: 29 meq/L (ref 19–32)
Calcium: 9.7 mg/dL (ref 8.4–10.5)
Chloride: 102 meq/L (ref 96–112)
Creatinine, Ser: 1.17 mg/dL (ref 0.40–1.50)
GFR: 67.46 mL/min (ref >60.00)
Glucose, Bld: 101 mg/dL — ABNORMAL HIGH (ref 70–99)
Potassium: 4.2 meq/L (ref 3.5–5.1)
Sodium: 138 meq/L (ref 135–145)
Total Bilirubin: 0.6 mg/dL (ref 0.2–1.2)
Total Protein: 6.9 g/dL (ref 6.0–8.3)

## 2024-03-16 LAB — LIPID PANEL
Cholesterol: 219 mg/dL — ABNORMAL HIGH (ref 0–200)
HDL: 58.9 mg/dL (ref >39.00)
LDL Cholesterol: 138 mg/dL — ABNORMAL HIGH (ref 0–99)
NonHDL: 160.41
Total CHOL/HDL Ratio: 4
Triglycerides: 111 mg/dL (ref 0.0–149.0)
VLDL: 22.2 mg/dL (ref 0.0–40.0)

## 2024-03-16 LAB — VITAMIN B12: Vitamin B-12: 244 pg/mL (ref 211–911)

## 2024-03-16 LAB — HIGH SENSITIVITY CRP: CRP, High Sensitivity: 1.61 mg/L (ref 0.000–5.000)

## 2024-03-16 LAB — VITAMIN D 25 HYDROXY (VIT D DEFICIENCY, FRACTURES): VITD: 32.69 ng/mL (ref 30.00–100.00)

## 2024-03-20 ENCOUNTER — Ambulatory Visit: Payer: Self-pay | Admitting: Family Medicine

## 2024-03-20 ENCOUNTER — Other Ambulatory Visit: Payer: Self-pay | Admitting: Family Medicine

## 2024-03-20 DIAGNOSIS — E559 Vitamin D deficiency, unspecified: Secondary | ICD-10-CM

## 2024-03-20 DIAGNOSIS — E538 Deficiency of other specified B group vitamins: Secondary | ICD-10-CM

## 2024-03-20 DIAGNOSIS — E785 Hyperlipidemia, unspecified: Secondary | ICD-10-CM

## 2024-03-29 ENCOUNTER — Ambulatory Visit: Payer: Commercial Managed Care - PPO | Admitting: Psychiatry

## 2024-03-29 DIAGNOSIS — H524 Presbyopia: Secondary | ICD-10-CM | POA: Diagnosis not present

## 2024-04-02 NOTE — Addendum Note (Signed)
 Addended by: ANTONIO CYNDEE ROCKERS R on: 04/02/2024 04:47 PM   Modules accepted: Level of Service

## 2024-04-13 ENCOUNTER — Other Ambulatory Visit: Payer: Self-pay | Admitting: Psychiatry

## 2024-04-13 DIAGNOSIS — F411 Generalized anxiety disorder: Secondary | ICD-10-CM

## 2024-04-15 MED ORDER — FLUOXETINE HCL 20 MG PO CAPS
20.0000 mg | ORAL_CAPSULE | Freq: Every day | ORAL | 0 refills | Status: DC
  Filled 2024-04-15: qty 90, 90d supply, fill #0

## 2024-04-16 ENCOUNTER — Other Ambulatory Visit (HOSPITAL_BASED_OUTPATIENT_CLINIC_OR_DEPARTMENT_OTHER): Payer: Self-pay

## 2024-04-26 ENCOUNTER — Other Ambulatory Visit: Payer: Self-pay | Admitting: Orthopedic Surgery

## 2024-04-26 ENCOUNTER — Other Ambulatory Visit: Payer: Self-pay | Admitting: Psychiatry

## 2024-04-26 ENCOUNTER — Other Ambulatory Visit: Payer: Self-pay

## 2024-04-26 ENCOUNTER — Other Ambulatory Visit: Payer: Self-pay | Admitting: Family Medicine

## 2024-04-26 ENCOUNTER — Other Ambulatory Visit (HOSPITAL_BASED_OUTPATIENT_CLINIC_OR_DEPARTMENT_OTHER): Payer: Self-pay

## 2024-04-26 DIAGNOSIS — F5105 Insomnia due to other mental disorder: Secondary | ICD-10-CM

## 2024-04-26 DIAGNOSIS — M25552 Pain in left hip: Secondary | ICD-10-CM

## 2024-04-26 MED ORDER — ALPRAZOLAM 0.5 MG PO TABS
0.5000 mg | ORAL_TABLET | Freq: Three times a day (TID) | ORAL | 0 refills | Status: DC | PRN
  Filled 2024-04-26: qty 270, 90d supply, fill #0

## 2024-04-26 MED ORDER — VALACYCLOVIR HCL 1 G PO TABS
1000.0000 mg | ORAL_TABLET | Freq: Every day | ORAL | 3 refills | Status: DC
  Filled 2024-04-26: qty 90, 90d supply, fill #0

## 2024-04-30 ENCOUNTER — Ambulatory Visit

## 2024-04-30 DIAGNOSIS — M25552 Pain in left hip: Secondary | ICD-10-CM | POA: Diagnosis not present

## 2024-04-30 DIAGNOSIS — M7602 Gluteal tendinitis, left hip: Secondary | ICD-10-CM | POA: Diagnosis not present

## 2024-04-30 DIAGNOSIS — M1612 Unilateral primary osteoarthritis, left hip: Secondary | ICD-10-CM | POA: Diagnosis not present

## 2024-04-30 DIAGNOSIS — M94252 Chondromalacia, left hip: Secondary | ICD-10-CM | POA: Diagnosis not present

## 2024-05-17 ENCOUNTER — Other Ambulatory Visit (HOSPITAL_BASED_OUTPATIENT_CLINIC_OR_DEPARTMENT_OTHER): Payer: Self-pay

## 2024-05-17 ENCOUNTER — Other Ambulatory Visit: Payer: Self-pay

## 2024-05-17 DIAGNOSIS — L814 Other melanin hyperpigmentation: Secondary | ICD-10-CM | POA: Diagnosis not present

## 2024-05-17 DIAGNOSIS — L821 Other seborrheic keratosis: Secondary | ICD-10-CM | POA: Diagnosis not present

## 2024-05-17 DIAGNOSIS — L738 Other specified follicular disorders: Secondary | ICD-10-CM | POA: Diagnosis not present

## 2024-05-17 DIAGNOSIS — L718 Other rosacea: Secondary | ICD-10-CM | POA: Diagnosis not present

## 2024-05-17 DIAGNOSIS — D485 Neoplasm of uncertain behavior of skin: Secondary | ICD-10-CM | POA: Diagnosis not present

## 2024-05-17 DIAGNOSIS — Z872 Personal history of diseases of the skin and subcutaneous tissue: Secondary | ICD-10-CM | POA: Diagnosis not present

## 2024-05-17 DIAGNOSIS — D225 Melanocytic nevi of trunk: Secondary | ICD-10-CM | POA: Diagnosis not present

## 2024-05-17 MED ORDER — AZELAIC ACID 15 % EX GEL
1.0000 | Freq: Every day | CUTANEOUS | 3 refills | Status: AC
  Filled 2024-05-17: qty 50, 50d supply, fill #0

## 2024-05-23 ENCOUNTER — Telehealth: Payer: Self-pay

## 2024-05-23 NOTE — Telephone Encounter (Signed)
 Dr Amon contacted our office. He is considering a surgery date in November. Before he schedules surgery, he would like to come in to discuss things with Dr Clois again. He has been discharged by PT. He is aware that he will need a negative nicotine test in order to submit authorization for surgery with [employer]. He states I cheat sometimes, but I am going to be very good between now and surgery. Per his request, I have scheduled him a return appointment with Dr Clois on 07/05/24. He would like to wait until he sees Dr Clois before he chooses a date for surgery. He is also OK with a new MRI prior to surgery if needed prior to surgery.

## 2024-05-28 ENCOUNTER — Other Ambulatory Visit: Payer: Self-pay | Admitting: Allergy and Immunology

## 2024-05-28 ENCOUNTER — Other Ambulatory Visit: Payer: Self-pay

## 2024-05-28 ENCOUNTER — Other Ambulatory Visit (HOSPITAL_BASED_OUTPATIENT_CLINIC_OR_DEPARTMENT_OTHER): Payer: Self-pay

## 2024-05-28 MED ORDER — PANTOPRAZOLE SODIUM 40 MG PO TBEC
40.0000 mg | DELAYED_RELEASE_TABLET | Freq: Two times a day (BID) | ORAL | 1 refills | Status: AC
  Filled 2024-05-28: qty 180, 90d supply, fill #0
  Filled 2024-10-11: qty 180, 90d supply, fill #1

## 2024-06-07 ENCOUNTER — Ambulatory Visit (INDEPENDENT_AMBULATORY_CARE_PROVIDER_SITE_OTHER): Admitting: Psychiatry

## 2024-06-07 ENCOUNTER — Other Ambulatory Visit (HOSPITAL_BASED_OUTPATIENT_CLINIC_OR_DEPARTMENT_OTHER): Payer: Self-pay

## 2024-06-07 ENCOUNTER — Encounter: Payer: Self-pay | Admitting: Psychiatry

## 2024-06-07 DIAGNOSIS — F411 Generalized anxiety disorder: Secondary | ICD-10-CM

## 2024-06-07 DIAGNOSIS — F5105 Insomnia due to other mental disorder: Secondary | ICD-10-CM

## 2024-06-07 MED ORDER — FLUOXETINE HCL 20 MG PO CAPS
20.0000 mg | ORAL_CAPSULE | Freq: Every day | ORAL | 3 refills | Status: AC
  Filled 2024-06-07 – 2024-07-12 (×2): qty 90, 90d supply, fill #0
  Filled 2024-10-11: qty 90, 90d supply, fill #1

## 2024-06-07 MED ORDER — ALPRAZOLAM 0.5 MG PO TABS
0.5000 mg | ORAL_TABLET | Freq: Three times a day (TID) | ORAL | 1 refills | Status: AC | PRN
  Filled 2024-06-07 – 2024-10-11 (×2): qty 270, 90d supply, fill #0

## 2024-06-07 NOTE — Progress Notes (Signed)
 Daniel Lam, [employer] [id number] 09-01-1961 63 y.o.  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  Chief Complaint:  No chief complaint on file.   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  Last seen a year ago without med changes..  10/08/21 appt noted: Challenges this year 52 yo son declared himself transgender.  Reacted the best I can. Never really tried Wellbutrin  bc he didn't want to bother with more pills.  He might consider it.   OK with depression and anxiety.  One of the things that helped was more acceptance.  Happy being a doctor.   No sig changes.  Increased xanax  a little over time.  Needed more Xanax  to sleep 0.75 mg HS.  Sleep OK with that.  Anxiety is manageable.  Mood and otherwise are the same. Relational stress. Doesn't think meds will fix this.  Use CPAP.   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  Not able to do that.  09/23/22  appt noted: Good overall.  Going regularly to see Patty VonSteen therapist.  Has been very helpful.  Journaling.  Has been helpful.  Liberating in a way. Overall things are good.  Will slow down in practice and transition over the next 5 years and work less and do things more.   Has felt ok with meds so hasn't changed anything. Plans to cut hours next year.   09/29/23 appt noted: Psych med: alprazolam  0.5 mg TID prn, fluoxetine  20 mg daily. Tried buspar  but really dizzy on it for 4-5 hours and didn't tolerate it but tried for weeks. Was off fluoxetine  end August.   Had some physical stresses including back pain and asthma, bad reflux interfered with sleep.  Got him really anxious and almost panic. Anxiety got worse so back on fluoxetine  20 mg daily.  Good benefit now.  Doesn't want to make med changes Needs surgery for back.   I'm burned out with lack of independence.    06/07/24 appt noted:  Psych med: alprazolam  0.5 mg TID prn, fluoxetine  20 mg daily. Xanax  HS only. Good .  Things stable  with mood and anxiety.  Nothing new other than back pain and pending surgery in about November.   Interfering with activity.   Gained wt unable to exercise.   Losing weight 14# with semaglutide.  Mild SE. Normal stress with work and family but nothing new. No further sig anxiety nor panic.   SE manageable.    Past Psychiatric Medication Trials:  Trintellix SE,  Fluoxetine , sertraline, citalopram, paroxetine, venlafaxine constipation,  Buspirone  dizzy Xanax   Review of Systems:  Review of Systems  Respiratory:  Positive for shortness of breath.   Cardiovascular:  Negative for chest pain and palpitations.  Musculoskeletal:  Positive for back pain.  Neurological:  Negative for tremors.  Psychiatric/Behavioral:  Negative for dysphoric mood.     Medications: I have reviewed the patient's current medications.  Current Outpatient Medications  Medication Sig Dispense Refill   Albuterol -Budesonide  (AIRSUPRA ) 90-80 MCG/ACT AERO Inhale 2 Inhalations into the lungs every 6 (six) hours as needed. 10.7 g 2   ALPRAZolam  (XANAX ) 0.5 MG tablet Take 1 tablet (0.5 mg total) by mouth 3 (three) times daily as needed. 270 tablet 1   Azelaic Acid  15 % gel Apply a pea sized amount topically to face at bedtime. 50 g 3   budesonide -formoterol  (SYMBICORT ) 80-4.5 MCG/ACT inhaler Inhale 2 puffs into the  lungs 2 (two) times daily. 30.6 g 5   EPINEPHrine  (EPIPEN  2-PAK) 0.3 mg/0.3 mL IJ SOAJ injection Inject 0.3 mg into the muscle as needed for anaphylaxis. 2 each 3   fexofenadine (ALLEGRA) 60 MG tablet Take 60 mg by mouth 2 (two) times daily.     FLUoxetine  (PROZAC ) 20 MG capsule Take 1 capsule (20 mg total) by mouth daily. 90 capsule 3   methocarbamol  (ROBAXIN ) 500 MG tablet Take 1 tablet (500 mg total) by mouth every 6 (six) hours as needed for muscle spasms. 120 tablet 0   pantoprazole  (PROTONIX ) 40 MG tablet Take 1 tablet (40 mg total) by mouth 2 (two) times daily. 180 tablet 1   Semaglutide, 1 MG/DOSE, 2  MG/1.5ML SOPN Inject into the skin.     valACYclovir  (VALTREX ) 1000 MG tablet Take 1 tablet (1,000 mg total) by mouth daily. 90 tablet 3   No current facility-administered medications for this visit.    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   Numbness and tingling    Other and unspecified anterior pituitary hyperfunction    Sleep apnea    Unspecified asthma(493.90)    controlled    Family History  Problem Relation Age of Onset   Sjogren's syndrome Mother    COPD Mother    Colon polyps Father    Dementia Father    Throat cancer Father    Melanoma Brother    Colon cancer Neg Hx    Esophageal cancer Neg Hx    Stomach cancer Neg Hx    Rectal cancer Neg Hx    Liver disease Neg Hx     Social History   Socioeconomic History   Marital status: Married    Spouse name: Not on file   Number of children: 3   Years of education: MD   Highest education level: Not on file  Occupational History   Occupation: physician    Comment: internal medicine  Tobacco Use   Smoking status: Some Days    Current packs/day: 0.25    Types: Cigarettes, Cigars   Smokeless tobacco: Never   Tobacco comments:    Occasional cigar  Vaping Use   Vaping status: Never Used  Substance and Sexual Activity   Alcohol use: Yes    Alcohol/week: 0.0 standard  drinks of alcohol    Comment: occ   Drug use: No   Sexual activity: Yes    Partners: Female  Other Topics Concern   Not on file  Social History Narrative   Regular exercise: yes, very active   Caffeine use: 2 cups of coffee and soda's daily.   Right-handed.   Social Drivers of Corporate investment banker Strain: Not  on file  Food Insecurity: Not on file  Transportation Needs: Not on file  Physical Activity: Not on file  Stress: Not on file  Social Connections: Not on file  Intimate Partner Violence: Not on file    Past Medical History, Surgical history, Social history, and Family history were reviewed and updated as appropriate.   Please see review of systems for further details on the patient's review from today.   Objective:   Physical Exam:  There were no vitals taken for this visit.  Physical Exam Constitutional:      General: He is not in acute distress.    Appearance: He is well-developed.  Musculoskeletal:        General: No deformity.  Neurological:     Mental Status: He is alert and oriented to person, place, and time.     Coordination: Coordination normal.  Psychiatric:        Attention and Perception: Attention and perception normal. He does not perceive auditory or visual hallucinations.        Mood and Affect: Mood normal. Mood is not anxious or depressed. Affect is not labile or angry.        Speech: Speech normal.        Behavior: Behavior normal. Behavior is not slowed.        Thought Content: Thought content normal. Thought content is not delusional. Thought content does not include homicidal or suicidal ideation. Thought content does not include suicidal plan.        Cognition and Memory: Cognition and memory normal.        Judgment: Judgment normal.     Comments: Insight intact. No delusions.      Lab Review:     Component Value Date/Time   NA 138 03/15/2024 1124   NA 137 01/06/2023 0000   K 4.2 03/15/2024 1124   CL 102 03/15/2024 1124    CO2 29 03/15/2024 1124   GLUCOSE 101 (H) 03/15/2024 1124   BUN 16 03/15/2024 1124   BUN 15 01/06/2023 0000   CREATININE 1.17 03/15/2024 1124   CREATININE 1.01 07/16/2019 1228   CALCIUM 9.7 03/15/2024 1124   PROT 6.9 03/15/2024 1124   PROT 6.7 01/07/2021 0740   ALBUMIN 4.5 03/15/2024 1124   ALBUMIN 4.2 01/07/2021 0740   AST 27 03/15/2024 1124   ALT 29 03/15/2024 1124   ALKPHOS 66 03/15/2024 1124   BILITOT 0.6 03/15/2024 1124   BILITOT 0.5 01/07/2021 0740   GFRNONAA >60 07/24/2023 1302   GFRNONAA 83 07/16/2019 1228   GFRAA 96 07/16/2019 1228       Component Value Date/Time   WBC 4.5 03/15/2024 1124   RBC 4.58 03/15/2024 1124   HGB 14.9 03/15/2024 1124   HGB 14.3 01/07/2021 0740   HCT 44.1 03/15/2024 1124   HCT 41.4 01/07/2021 0740   PLT 201.0 03/15/2024 1124   PLT 186 01/07/2021 0740   MCV 96.2 03/15/2024 1124   MCV 97 01/07/2021 0740   MCH 32.8 07/24/2023 1302   MCHC 33.9 03/15/2024 1124   RDW 12.9 03/15/2024 1124   RDW 12.1 01/07/2021 0740   LYMPHSABS 0.9 03/15/2024 1124   LYMPHSABS 1.1 01/07/2021 0740   MONOABS 0.4 03/15/2024 1124   EOSABS 0.1 03/15/2024 1124   EOSABS 0.1 01/07/2021 0740   BASOSABS 0.0 03/15/2024 1124   BASOSABS 0.0 01/07/2021 0740    No results found for: POCLITH, LITHIUM   No results found for: PHENYTOIN, PHENOBARB, VALPROATE, CBMZ   .res Assessment: Plan:  Diagnoses and all orders for this visit:  Generalized anxiety disorder -     FLUoxetine  (PROZAC ) 20 MG capsule; Take 1 capsule (20 mg total) by mouth daily.  Insomnia due to mental condition -     ALPRAZolam  (XANAX ) 0.5 MG tablet; Take 1 tablet (0.5 mg total) by mouth 3 (three) times daily as needed.    Disc option of counseling again.   Not depressed but disenchanted.  Satisfied with meds Option switch duloxetine for pain.    No buspar  DT SE  Continue fluoxetine  20 mg daily  for anxiety and mood and Xanax  0.75 mg for sleep.  No Xanax  daytime.  Disc need to  retire to something.  He doesn't have hobby other than woodworking but will consider.   FU  12 mos  Lorene Macintosh, MD, DFAPA    Please see After Visit Summary for patient specific instructions.  Future Appointments  Date Time Provider Department Center  07/05/2024  3:45 PM Clois Fret, MD [facility] None  08/02/2024  9:30 AM Dohmeier, Dedra, MD [facility] None     No orders of the defined types were placed in this encounter.   -------------------------------

## 2024-07-05 ENCOUNTER — Encounter: Payer: Self-pay | Admitting: Neurosurgery

## 2024-07-05 ENCOUNTER — Ambulatory Visit: Admitting: Neurosurgery

## 2024-07-05 VITALS — BP 124/86 | Ht 70.0 in | Wt 212.2 lb

## 2024-07-05 DIAGNOSIS — M5442 Lumbago with sciatica, left side: Secondary | ICD-10-CM | POA: Diagnosis not present

## 2024-07-05 DIAGNOSIS — G8929 Other chronic pain: Secondary | ICD-10-CM

## 2024-07-05 DIAGNOSIS — M4310 Spondylolisthesis, site unspecified: Secondary | ICD-10-CM

## 2024-07-05 DIAGNOSIS — M25552 Pain in left hip: Secondary | ICD-10-CM | POA: Diagnosis not present

## 2024-07-05 NOTE — Progress Notes (Signed)
 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  He has had ongoing back and right leg pain.  He has had intermittent pain for years.  He continues to have pain.  He is going back for physical therapy reevaluation today.  He smoked his last cigar 2 days ago.   11/10/2023 Daniel Lam is here today with a chief complaint of back pain and R sided pain.  He began having symptoms on the R a few days ago.  His pain peaked last night.  He did not have paresthesias. He had mostly R sided pain into his buttock.  He had trouble walking yesterday.    This is his 4th episode of pain in the past year.  Each episode lasts a few weeks, then he has an interval of reasonable pain for a couple of weeks before he has another episode of pain.    He had a L-sided injection at L5/S1 today.   He had an injection on the left side a few weeks ago without much impact to his symptoms.    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  He has been doing some exercises, but not started physical therapy.  He has had 1 injection which did help.  He has another injection scheduled.       08/25/2023 Daniel Lam is here today with a chief complaint of intermittent low back pain that began in December 2023.  He went on a hike in December and had onset of back pain.  It subsequently improved, but he had an additional episode that began in April 2024.  Since that time, he also began having pain into his left buttock and numbness and weakness in his left foot.  His numbness on the bottom of the foot and he has some pain around his ankle.  His pain can get as bad as 7 out of 10 with walking or standing.  Sitting or laying down makes it improved.  He  feels the pain almost immediately when he stands.  He has pain within walking 50 feet.   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   The symptoms are causing a significant impact on the patient's life.    I have utilized the care everywhere function in epic to review the outside records available from external health systems.    Review of Systems:  A 10 point review of systems is negative, except for the pertinent positives and negatives detailed in the HPI.  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 08/2009    Allergies: Allergies as of 07/05/2024 - Review Complete 07/05/2024  Allergen Reaction Noted   Alpha-gal Hives 12/16/2023   Demerol [meperidine hcl]  08/27/2011   Food  08/22/2018    Latex     Morphine and codeine  08/27/2011   Other Other (See Comments) 08/22/2018    Medications:  Current Outpatient Medications:    Albuterol -Budesonide  (AIRSUPRA ) 90-80 MCG/ACT AERO, Inhale 2 Inhalations into the lungs every 6 (six) hours as needed., Disp: 10.7 g, Rfl: 2   ALPRAZolam  (XANAX ) 0.5 MG tablet, Take 1 tablet (0.5 mg total) by mouth 3 (three) times daily as needed., Disp: 270 tablet, Rfl: 1   Azelaic Acid  15 % gel, Apply a pea sized amount topically to face at bedtime., Disp: 50 g, Rfl: 3   budesonide -formoterol  (SYMBICORT ) 80-4.5 MCG/ACT inhaler, Inhale 2 puffs into the lungs 2 (two) times daily., Disp: 30.6 g, Rfl: 5   EPINEPHrine  (EPIPEN  2-PAK) 0.3 mg/0.3 mL IJ SOAJ injection, Inject 0.3 mg into the muscle as needed for anaphylaxis., Disp: 2 each, Rfl: 3   fexofenadine (ALLEGRA) 60 MG tablet, Take 60 mg by mouth 2 (two) times daily., Disp: , Rfl:    FLUoxetine  (PROZAC ) 20 MG capsule, Take 1 capsule (20 mg total) by mouth daily., Disp: 90 capsule, Rfl: 3   methocarbamol  (ROBAXIN ) 500 MG tablet, Take 1 tablet (500 mg total) by mouth every 6 (six) hours as needed for muscle spasms., Disp: 120 tablet, Rfl: 0   pantoprazole  (PROTONIX ) 40 MG tablet, Take 1 tablet (40 mg total) by mouth 2 (two) times daily., Disp: 180 tablet, Rfl: 1   Semaglutide, 1 MG/DOSE, 2 MG/1.5ML SOPN, Inject into the skin., Disp: , Rfl:    valACYclovir  (VALTREX ) 1000 MG tablet, Take 1 tablet (1,000 mg total) by mouth daily., Disp: 90 tablet, Rfl: 3  Social History: Social History   Tobacco Use   Smoking status: Some Days    Current packs/day: 0.25    Types: Cigarettes, Cigars   Smokeless tobacco: Never   Tobacco comments:    Occasional cigar  Vaping Use   Vaping status: Never Used  Substance Use Topics   Alcohol use: Yes    Alcohol/week: 0.0 standard drinks of alcohol    Comment: occ   Drug use: No    Family Medical History: Family History  Problem Relation Age of Onset   Sjogren's syndrome  Mother    COPD Mother    Colon polyps Father    Dementia Father    Throat cancer Father    Melanoma Brother    Colon cancer Neg Hx    Esophageal cancer Neg Hx    Stomach cancer Neg Hx    Rectal cancer Neg Hx    Liver disease Neg Hx  Physical Examination: Vitals:   07/05/24 1530  BP: 124/86   CNI MAEW 5/5 throughout L L5 distributions altered sensation and paiin  Medical Decision Making  Imaging: MRI L spine 08/27/2023 IMPRESSION: 1. Interval resolution of the previously seen right subarticular disc extrusion at L5-S1. Unchanged severe left and moderate right neural foraminal narrowing at this level. 2. Moderate bilateral neural foraminal narrowing at L4-5, slightly worse on the right. 3. Moderate right neural foraminal narrowing at L3-4.     Electronically Signed   By: Ryan Chess M.D.   On: 08/31/2023 09:43   I have reviewed his x-rays as well.  He has some retrolisthesis of L4 and L5.  He has history of a synovial cyst at this level suggesting instability.  I have personally reviewed the images and agree with the above interpretation.  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  He has previously had multiple episodes of left-sided sciatica and right sided sciatica.  Over the past year, his episodes have being longer in his asymptomatic intervals and been getting shorter.  He tried physical therapy without substantial long-term improvement.  He is returning for reevaluation today.  He has had multiple injections.  At this point, he has tried and failed conservative management extensively. We discussed that he has bilateral L5 compression which I think is symptomatic. I would like to get flexion-extension radiographs of his lower back. Because of his retrolisthesis and the position of his posterior superior iliac crest, far lateral foraminotomy at L5-S1 is not possible. As such, I would recommend L5-S1  transforaminal lumbar interbody fusion.   I discussed the planned procedure at length with the patient, including the risks, benefits, alternatives, and indications. The risks discussed include but are not limited to bleeding, infection, need for reoperation, spinal fluid leak, stroke, vision loss, anesthetic complication, coma, paralysis, and even death. I also described in detail that improvement was not guaranteed.  The patient expressed understanding of these risks, and asked that we proceed with surgery. I described the surgery in layman's terms, and gave ample opportunity for questions, which were answered to the best of my ability.   I spent a total of 15 minutes in this patient's care today. This time was spent reviewing pertinent records including imaging studies, obtaining and confirming history, performing a directed evaluation, formulating and discussing my recommendations, and documenting the visit within the medical record.    Thank you for involving me in the care of this patient.      Daniel Mahnke K. Clois MD, [facility]

## 2024-07-05 NOTE — Patient Instructions (Signed)
 Please see below for information in regards to your upcoming surgery:   Planned surgery: Left L5-S1 minimally invasive (MIS) transforaminal lumbar interbody fusion (TLIF)   Surgery date: (contact when ready to schedule) at [facility]: [address] [place], [place] [address]) - you will find out your arrival time the business day before your surgery.   Pre-op appointment at [facility] Pre-admit Testing: you will receive a call with a date/time for this appointment. If you are scheduled for an in person appointment, Pre-admit Testing is located on the first floor of the [facility] building, [address], [address]. During this appointment, they will advise you which medications you can take the morning of surgery, and which medications you will need to hold for surgery. Labs (such as blood work, EKG) may be done at your pre-op appointment. You are not required to fast for these labs. Should you need to change your pre-op appointment, please call Pre-admit testing at [phone].      Diabetes/heart failure/kidney disease/weight loss medications that require an extended hold: Per anesthesia guidelines (due to the increased risk of aspiration caused by delayed gastric emptying):   Semaglutide injections: hold for 7 days prior to surgery    Surgical clearance: we will send a clearance form to [facility], DO. They may wish to see you in their office prior to signing the clearance form. If so, they may call you to schedule an appointment.     NSAIDS (Non-steroidal anti-inflammatory drugs): because you are having a fusion, please avoid taking any NSAIDS (examples: ibuprofen , motrin , aleve, naproxen, meloxicam, diclofenac) for 3 months after surgery. Celebrex is an exception and is OK to take, if prescribed. Tylenol  is not an NSAID.    Common restrictions after spine surgery: No bending, lifting, or twisting ("BLT"). Avoid  lifting objects heavier than 10 pounds for the first 6 weeks after surgery. Where possible, avoid household activities that involve lifting, bending, reaching, pushing, or pulling such as laundry, vacuuming, grocery shopping, and childcare. Try to arrange for help from friends and family for these activities while you heal. Do not drive while taking prescription pain medication. Weeks 6 through 12 after surgery: avoid lifting more than 25 pounds.    X-rays after surgery: Because you are having a fusion or arthroplasty: for appointments after your 2 week follow-up: please arrive at the [facility] outpatient imaging center ([address], [place]) or [facility] one hour prior to your appointment for x-rays. This applies to every appointment after your 2 week follow-up. Failure to do so may result in your appointment being rescheduled. *We recently started construction to have x-ray in our office. This may be completed by the time you come in for your 6 week post-op appointment. Please check with us  closer to that time to see if you can have your x-rays at our office*    How to contact us :  If you have any questions/concerns before or after surgery, you can reach us  at [phone], or you can send a mychart message. We can be reached by phone or mychart 8am-4pm, Monday-Friday.  *Please note: Calls after 4pm are forwarded to a third party answering service. Mychart messages are not routinely monitored during evenings, weekends, and holidays. Please call our office to contact the answering service for urgent concerns during non-business hours.   If you have FMLA/disability paperwork, please drop it off or fax it to [phone]   Appointments/FMLA &  disability paperwork: [employer] Registered Nurse/Surgery scheduler: Anyah Swallow, RN Certified Medical Assistants: Don, CMA, Elenor, CMA, & Damien, CMA Physician Assistants: Lyle Decamp, PA-C, Edsel Goods,  PA-C & Glade Boys, PA-C Surgeons: Penne Sharps, MD & Reeves Daisy, MD   [facility] PREADMIT TESTING VISIT and SURGERY INFORMATION SHEET   Now that surgery has been scheduled you can anticipate several phone calls from [facility] services. A pharmacy technician will call you to verify your current list of medications taken at home.               The Pre-Service Center will call to verify your insurance information and to give you billing estimates and information.             The Preadmit Testing Office will be calling to schedule a visit to obtain information for the anesthesia team and provide instructions on preparation for surgery.  What can you expect for the Preadmit Testing Visit: Appointments may be scheduled in-person or by telephone.  If a telephone visit is scheduled, you may be asked to come into the office to have lab tests or other studies performed.   This visit will not be completed any greater than 14 days prior to your surgery.  If your surgery has been scheduled for a future date, please do not be alarmed if we have not contacted you to schedule an appointment more than a month prior to the surgery date.    Please be prepared to provide the following information during this appointment:            -Personal medical history                                               -Medication and allergy list            -Any history of problems with anesthesia              -Recent lab work or diagnostic studies            -Please notify us  of any needs we should be aware of to provide the best care possible           -You will be provided with instructions on how to prepare for your surgery.    On The Day of Surgery:  You must have a driver to take you home after surgery, you will be asked not to drive for 24 hours following surgery.  Taxi, Gisele and non-medical transport will not be acceptable means of transportation unless you have a responsible individual  who will be traveling with you.  Visitors in the surgical area:   2 people will be able to visit you in your room once your preparation for surgery has been completed. During surgery, your visitors will be asked to wait in the Surgery Waiting Area.  It is not a requirement for them to stay, if they prefer to leave and come back.  Your visitor(s) will be given an update once the surgery has been completed.  No visitors are allowed in the initial recovery room to respect patient privacy and safety.  Once you are more awake and transfer to the secondary recovery area, or are transferred to an inpatient room, visitors will again be able to see you.  To respect and protect your privacy: We will  ask on the day of surgery who your driver will be and what the contact number for that individual will be. We will ask if it is okay to share information with this individual, or if there is an alternative individual that we, or the surgeon, should contact to provide updates and information. If family or friends come to the surgical information desk requesting information about you, who you have not listed with us , no information will be given.   It may be helpful to designate someone as the main contact who will be responsible for updating your other friends and family.    PREADMIT TESTING OFFICE: [phone] SAME DAY SURGERY: [phone] We look forward to caring for you before and throughout the process of your surgery.

## 2024-07-10 ENCOUNTER — Other Ambulatory Visit: Payer: Self-pay | Admitting: Family Medicine

## 2024-07-10 DIAGNOSIS — E785 Hyperlipidemia, unspecified: Secondary | ICD-10-CM

## 2024-07-10 DIAGNOSIS — Z720 Tobacco use: Secondary | ICD-10-CM

## 2024-07-10 DIAGNOSIS — Z01818 Encounter for other preprocedural examination: Secondary | ICD-10-CM

## 2024-07-11 ENCOUNTER — Encounter: Payer: Self-pay | Admitting: Neurosurgery

## 2024-07-12 ENCOUNTER — Other Ambulatory Visit (HOSPITAL_BASED_OUTPATIENT_CLINIC_OR_DEPARTMENT_OTHER): Payer: Self-pay

## 2024-07-12 ENCOUNTER — Other Ambulatory Visit: Payer: Self-pay

## 2024-07-12 DIAGNOSIS — G8929 Other chronic pain: Secondary | ICD-10-CM

## 2024-07-12 DIAGNOSIS — M4310 Spondylolisthesis, site unspecified: Secondary | ICD-10-CM

## 2024-07-12 DIAGNOSIS — Z01818 Encounter for other preprocedural examination: Secondary | ICD-10-CM

## 2024-07-13 ENCOUNTER — Other Ambulatory Visit: Payer: Self-pay | Admitting: Family Medicine

## 2024-07-13 ENCOUNTER — Ambulatory Visit (INDEPENDENT_AMBULATORY_CARE_PROVIDER_SITE_OTHER): Admitting: *Deleted

## 2024-07-13 DIAGNOSIS — Z01818 Encounter for other preprocedural examination: Secondary | ICD-10-CM | POA: Diagnosis not present

## 2024-07-13 NOTE — Progress Notes (Signed)
 Patient here for preop EKG per Dr. Antonio.

## 2024-07-16 NOTE — Telephone Encounter (Signed)
 I spoke with Dr Amon. He reports that he quit using nicotine products 1 month ago, but he has had 1 slip up about 10 days ago. He plans to get the nicotine test done soon.

## 2024-07-18 ENCOUNTER — Other Ambulatory Visit (INDEPENDENT_AMBULATORY_CARE_PROVIDER_SITE_OTHER)

## 2024-07-18 DIAGNOSIS — E785 Hyperlipidemia, unspecified: Secondary | ICD-10-CM

## 2024-07-18 DIAGNOSIS — Z01818 Encounter for other preprocedural examination: Secondary | ICD-10-CM

## 2024-07-18 DIAGNOSIS — Z720 Tobacco use: Secondary | ICD-10-CM

## 2024-07-18 LAB — LIPID PANEL
Cholesterol: 225 mg/dL — ABNORMAL HIGH (ref 0–200)
HDL: 64.2 mg/dL (ref >39.00)
LDL Cholesterol: 139 mg/dL — ABNORMAL HIGH (ref 0–99)
NonHDL: 160.65
Total CHOL/HDL Ratio: 4
Triglycerides: 110 mg/dL (ref 0.0–149.0)
VLDL: 22 mg/dL (ref 0.0–40.0)

## 2024-07-18 LAB — POC URINALSYSI DIPSTICK (AUTOMATED)
Bilirubin, UA: NEGATIVE
Blood, UA: NEGATIVE
Glucose, UA: NEGATIVE
Ketones, UA: NEGATIVE
Leukocytes, UA: NEGATIVE
Nitrite, UA: NEGATIVE
Protein, UA: NEGATIVE
Spec Grav, UA: 1.01 (ref 1.010–1.025)
Urobilinogen, UA: 0.2 U/dL
pH, UA: 7 (ref 5.0–8.0)

## 2024-07-18 LAB — CBC WITH DIFFERENTIAL/PLATELET
Basophils Absolute: 0 K/uL (ref 0.0–0.1)
Basophils Relative: 0.6 % (ref 0.0–3.0)
Eosinophils Absolute: 0.1 K/uL (ref 0.0–0.7)
Eosinophils Relative: 2 % (ref 0.0–5.0)
HCT: 43.8 % (ref 39.0–52.0)
Hemoglobin: 14.6 g/dL (ref 13.0–17.0)
Lymphocytes Relative: 22.2 % (ref 12.0–46.0)
Lymphs Abs: 0.9 K/uL (ref 0.7–4.0)
MCHC: 33.3 g/dL (ref 30.0–36.0)
MCV: 96.1 fl (ref 78.0–100.0)
Monocytes Absolute: 0.4 K/uL (ref 0.1–1.0)
Monocytes Relative: 9.3 % (ref 3.0–12.0)
Neutro Abs: 2.7 K/uL (ref 1.4–7.7)
Neutrophils Relative %: 65.9 % (ref 43.0–77.0)
Platelets: 190 K/uL (ref 150.0–400.0)
RBC: 4.55 Mil/uL (ref 4.22–5.81)
RDW: 13 % (ref 11.5–15.5)
WBC: 4.1 K/uL (ref 4.0–10.5)

## 2024-07-18 LAB — COMPREHENSIVE METABOLIC PANEL WITH GFR
ALT: 40 U/L (ref 0–53)
AST: 26 U/L (ref 0–37)
Albumin: 4.5 g/dL (ref 3.5–5.2)
Alkaline Phosphatase: 66 U/L (ref 39–117)
BUN: 15 mg/dL (ref 6–23)
CO2: 31 meq/L (ref 19–32)
Calcium: 9.4 mg/dL (ref 8.4–10.5)
Chloride: 102 meq/L (ref 96–112)
Creatinine, Ser: 1.03 mg/dL (ref 0.40–1.50)
GFR: 78.42 mL/min (ref >60.00)
Glucose, Bld: 99 mg/dL (ref 70–99)
Potassium: 4.3 meq/L (ref 3.5–5.1)
Sodium: 138 meq/L (ref 135–145)
Total Bilirubin: 0.6 mg/dL (ref 0.2–1.2)
Total Protein: 6.7 g/dL (ref 6.0–8.3)

## 2024-07-18 NOTE — Addendum Note (Signed)
 Addended by: DORLENE CHIQUITA RAMAN on: 07/18/2024 10:06 AM   Modules accepted: Orders

## 2024-07-19 LAB — METHICILLIN RESISTANT STAPH AUREUS, PCR: Methicillin Resistant Staph Aureus, PCR: NOT DETECTED

## 2024-07-21 ENCOUNTER — Other Ambulatory Visit: Payer: Self-pay | Admitting: Family Medicine

## 2024-07-21 ENCOUNTER — Ambulatory Visit: Payer: Self-pay | Admitting: Family Medicine

## 2024-07-21 DIAGNOSIS — E785 Hyperlipidemia, unspecified: Secondary | ICD-10-CM

## 2024-07-21 LAB — NICOTINE/COTININE METABOLITES
Cotinine: <1 ng/mL
Nicotine: <1 ng/mL

## 2024-08-01 NOTE — Progress Notes (Unsigned)
 [name]

## 2024-08-02 ENCOUNTER — Telehealth (INDEPENDENT_AMBULATORY_CARE_PROVIDER_SITE_OTHER): Payer: Commercial Managed Care - PPO | Admitting: Neurology

## 2024-08-02 ENCOUNTER — Encounter
Admission: RE | Admit: 2024-08-02 | Discharge: 2024-08-02 | Disposition: A | Discharge status: Home / Self Care | Source: Ambulatory Visit | Attending: Neurosurgery | Admitting: Neurosurgery

## 2024-08-02 ENCOUNTER — Other Ambulatory Visit: Payer: Self-pay

## 2024-08-02 ENCOUNTER — Encounter: Payer: Self-pay | Admitting: Neurology

## 2024-08-02 VITALS — BP 121/78 | HR 65 | Resp 18 | Ht 70.0 in | Wt 214.0 lb

## 2024-08-02 DIAGNOSIS — Z01812 Encounter for preprocedural laboratory examination: Secondary | ICD-10-CM | POA: Insufficient documentation

## 2024-08-02 DIAGNOSIS — M5416 Radiculopathy, lumbar region: Secondary | ICD-10-CM

## 2024-08-02 DIAGNOSIS — G4733 Obstructive sleep apnea (adult) (pediatric): Secondary | ICD-10-CM | POA: Diagnosis not present

## 2024-08-02 DIAGNOSIS — R202 Paresthesia of skin: Secondary | ICD-10-CM

## 2024-08-02 DIAGNOSIS — Z9989 Dependence on other enabling machines and devices: Secondary | ICD-10-CM

## 2024-08-02 DIAGNOSIS — F411 Generalized anxiety disorder: Secondary | ICD-10-CM | POA: Diagnosis not present

## 2024-08-02 DIAGNOSIS — Z01818 Encounter for other preprocedural examination: Secondary | ICD-10-CM

## 2024-08-02 HISTORY — DX: Gastro-esophageal reflux disease without esophagitis: K21.9

## 2024-08-02 HISTORY — DX: Unspecified asthma, uncomplicated: J45.909

## 2024-08-02 LAB — TYPE AND SCREEN
ABO/RH(D): A POS
Antibody Screen: NEGATIVE

## 2024-08-02 LAB — SURGICAL PCR SCREEN
MRSA, PCR: NEGATIVE
Staphylococcus aureus: POSITIVE — AB

## 2024-08-02 NOTE — Patient Instructions (Addendum)
 Living With Sleep Apnea Sleep apnea is a condition that affects your breathing while you're sleeping. Your tongue or the tissue in your throat may block the flow of air while you sleep. You may have shallow breathing or stop breathing for short periods of time. The breaks in breathing interrupt the deep sleep that you need to feel rested. Even if you don't wake up from the gaps in breathing, you may feel tired during the day. People with sleep apnea may snore loudly. You may have a headache in the morning and feel anxious or depressed. How can sleep apnea affect me? Sleep apnea increases your chances of being very tired during the day. This is called daytime fatigue. Sleep apnea can also increase your risk of: Heart attack. Stroke. Obesity. Type 2 diabetes. Heart failure. Irregular heartbeat. High blood pressure. If you are very tired during the day, you may be more likely to: Not do well in school or at work. Fall asleep while driving. Have trouble paying attention. Develop depression or anxiety. Have problems having sex. This is called sexual dysfunction. What actions can I take to manage sleep apnea? Sleep apnea treatment  If you were given a device to open your airway while you sleep, use it only as told by your health care provider. You may be given: An oral appliance. This is a mouthpiece that shifts your lower jaw forward. A continuous positive airway pressure (CPAP) device. This blows air through a mask. A nasal expiratory positive airway pressure (EPAP) device. This has valves that you put into each nostril. A bi-level positive airway pressure (BIPAP) device. This blows air through a mask when you breathe in and breathe out. You may need surgery if other treatments don't work for you. Sleep habits Go to sleep and wake up at the same time every day. This helps set your internal clock for sleeping. If you stay up later than usual on weekends, try to get up in the morning within 2  hours of the time you usually wake up. Try to get at least 7-9 hours of sleep each night. Stop using a computer, tablet, and mobile phone a few hours before bedtime. Do not take long naps during the day. If you nap, limit it to 30 minutes. Have a relaxing bedtime routine. Reading or listening to music may relax you and help you sleep. Use your bedroom only for sleep. Keep your television and computer out of your bedroom. Keep your bedroom cool, dark, and quiet. Use a supportive mattress and pillows. Follow your provider's instructions for other changes to sleep habits. Nutrition Do not eat big meals in the evening. Do not have caffeine in the later part of the day. The effects of caffeine can last for more than 5 hours. Follow your provider's instructions for any changes to what you eat and drink. Lifestyle Do not drink alcohol before bedtime. Alcohol can cause you to fall asleep at first, but then it can cause you to wake up in the middle of the night and have trouble getting back to sleep. Do not smoke, vape, or use nicotine or tobacco. Medicines Take over-the-counter and prescription medicines only as told by your provider. Do not use over-the-counter sleep medicine. You may become dependent on this medicine, and it can make sleep apnea worse. Do not take medicines, such as sedatives and narcotics, unless told to by your provider. Activity Exercise on most days, but avoid exercising in the evening. Exercising near bedtime can interfere with sleeping.  If possible, spend time outside every day. Natural light helps with your internal clock. General information Lose weight if you need to. Stay at a healthy weight. If you are having surgery, make sure to tell your provider that you have sleep apnea. You may need to bring your device with you. Keep all follow-up visits. Your provider will want to check on your condition. Where to find more information National Heart, Lung, and Blood  Institute: [employer] This information is not intended to replace advice given to you by your health care provider. Make sure you discuss any questions you have with your health care provider. Document Revised: 01/19/2023 Document Reviewed: 01/19/2023 Elsevier Patient Education  2024 Elsevier Inc.  Healthy Living: Sleep In this video, you will learn why sleep is an important part of a healthy lifestyle. To view the content, go to this web address: https://pe.elsevier.com/[email or web address]  This video will expire on: 09/21/2025. If you need access to this video following this date, please reach out to the healthcare provider who assigned it to you. This information is not intended to replace advice given to you by your health care provider. Make sure you discuss any questions you have with your health care provider. Elsevier Patient Education  2024 Elsevier Inc.  CPAP and BIPAP Information CPAP and BIPAP use air pressure to keep your airways open and help you breathe well. CPAP and BIPAP use different amounts of pressure. Your health care provider will tell you whether CPAP or BIPAP would be best for you. CPAP stands for continuous positive airway pressure. With CPAP, the amount of pressure stays the same while you breathe in and out. BIPAP stands for bi-level positive airway pressure. With BIPAP, the amount of pressure will be higher when you breathe in and lower when you breathe out. This allows you to take bigger breaths. CPAP or BIPAP may be used in the hospital or at home. You may need to have a sleep study before your provider can order a device for you to use at home. What are the advantages? CPAP and BIPAP are most often used for obstructive sleep apnea to keep the airways from collapsing when the muscles relax during sleep. CPAP or BIPAP can be used if you have: Chronic obstructive pulmonary disease. Heart failure. Medical conditions that cause muscle weakness. Other problems that cause  breathing to be shallow, weak, or difficult. What are the risks? Your provider will talk with you about risks. These may include: Sores on your nose or face caused from the mask, prongs, or nasal pillows. Dry or stuffy nose or nosebleeds. Feeling gassy or bloated. Sinus or lung infection if the equipment is not cleaned well. When should CPAP or BIPAP be used? In most cases, CPAP or BIPAP is used during sleep at night or whenever the main sleep time happens. It's also used during naps. People with some medical conditions may need to wear the mask when they're awake. Follow instructions from your provider about when to use your CPAP or BIPAP. What happens during CPAP or BIPAP?  Both CPAP and BIPAP use a small machine that uses electricity to create air pressure. A long tube connects the device to a plastic mask. Air is blown through the mask into your nose or mouth. The amount of pressure that's used to blow the air can be adjusted. Your provider will set the pressure setting and help you find the best mask for you. Tips for using the mask There are different types and sizes of  masks. If your mask does not fit well, talk with your provider about getting a different one. Some common types of masks include: Full face masks, which fit over the mouth and nose. Nasal masks, which fit over the nose. Nasal pillow or prong masks, which fit into the nostrils. The mask needs to be snug to your face, so some people feel trapped or closed in at first. If you feel this way, you may need to get used to the mask. Hold the mask loosely over your nose or mouth and then gradually put the the mask on more snugly. Slowly increase the amount of time you use the mask. If you have trouble with your mask not fitting well or leaking, talk with your provider. Do not stop using the mask. Tips for using the device Follow instructions from your provider about how to and how often to use the device. For home use, CPAP and  BIPAP devices come from home health care companies. There are many different brands. Your health insurance company will help to decide which device you get. Keep the CPAP or BIPAP device and attachments clean. Ask your home health care company or check the instruction book for cleaning instructions. Make sure the humidifier is filled with germ-free (sterile) water and is working correctly. This will help prevent a dry or stuffy nose or nosebleeds. A nasal saline mist or spray may keep your nose from getting dry and sore. Do not eat or drink while the CPAP or BIPAP device is on. Food or drinks could get pushed into your lungs by the pressure of the CPAP or BIPAP. Follow these instructions at home: Take over-the-counter and prescription medicines only as told by your provider. Do not smoke, vape, or use nicotine or tobacco. Contact a health care provider if: You have redness or pressure sores on your head, face, mouth, or nose from the mask or headgear. You have trouble using the CPAP or BIPAP device. You have trouble going to sleep or staying asleep. Someone tells you that you snore even when wearing your CPAP or BIPAP device. Get help right away if: You have trouble breathing. You feel confused. These symptoms may be an emergency. Get help right away. Call 911. Do not wait to see if the symptoms will go away. Do not drive yourself to the hospital. This information is not intended to replace advice given to you by your health care provider. Make sure you discuss any questions you have with your health care provider. Document Revised: 01/19/2023 Document Reviewed: 01/19/2023 Elsevier Patient Education  2024 [employer].

## 2024-08-02 NOTE — Progress Notes (Signed)
 Virtual Visit via Video Note  I connected with Daniel FORBES Mech, MD on 08/02/24 at  9:30 AM EDT by a video enabled telemedicine application and verified that I am speaking with the correct person using two identifiers.  Location: Patient: at his home  Provider: at her office, [facility]    I discussed the limitations of evaluation and management by telemedicine and the availability of in person appointments. The patient expressed understanding and agreed to proceed.  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  He  is highly complaint with his CPAP use,  He reports  in general good sleep, no recent Asthma flair -ups and is mostly impaired by Back and Leg pain.  GAD is treated with Prozac  and Xanax  , Dr Geoffry .    Observations/Objective: How likely are you to doze in the following situations: 0 = not likely, 1 = slight chance, 2 = moderate chance, 3 = high chance  Sitting and Reading? Watching Television? Sitting inactive in a public place (theater or meeting)? As a passenger in a car for an hour without a break? Lying down in the afternoon when circumstances permit? Sitting and talking to someone? Sitting quietly after lunch without alcohol? In a car, while stopped for a few minutes in traffic?   Total =  6/ 24 points.   FSS higher due to chronic back pain , L5-S1 dermatomal distribution, related to DDD/ spinal foraminal narrowing with arthritis.   Planned back surgery , fusion  on 11-03, Neurosurgery under Dr. Reeves Nine .     Assessment and Plan:  continue CPAP use, your AHI is well controlled .  You advised me that no supplies were needed at this time.     Follow Up Instructions:  Rv in 12 months, can be virtual , for CPAP compliance. Good luck  for you and speedy recovery from your upcoming surgery!    Please make sure your surgical team is aware of  your OSA diagnosis.       I discussed the assessment and treatment plan  with the patient. The patient was provided an opportunity to ask questions and all were answered. The patient agreed with the plan and demonstrated an understanding of the instructions.   The patient was advised to call back or seek an in-person evaluation if the symptoms worsen or if the condition fails to improve as anticipated.  I provided 15 minutes of non-face-to-face time during this encounter.   Dedra Gores, MD

## 2024-08-02 NOTE — Patient Instructions (Addendum)
 Your procedure is scheduled on: Monday 08/13/24 Report to the Registration Desk on the 1st floor of the [facility]. To find out your arrival time, please call [phone] between 1PM - 3PM on: Friday 08/10/24 If your arrival time is 6:00 am, do not arrive before that time as the [facility] entrance doors do not open until 6:00 am.  REMEMBER: Instructions that are not followed completely may result in serious medical risk, up to and including death; or upon the discretion of your surgeon and anesthesiologist your surgery may need to be rescheduled.  Do not eat food after midnight the night before surgery.  No gum chewing or hard candies.  You may however, drink CLEAR liquids up to 2 hours before you are scheduled to arrive for your surgery. Do not drink anything within 2 hours of your scheduled arrival time.  Clear liquids include: - water  - apple juice without pulp - gatorade (not RED colors) - black coffee or tea (Do NOT add milk or creamers to the coffee or tea) Do NOT drink anything that is not on this list.  **Type 1 and Type 2 diabetics should only drink water.**  One week prior to surgery: Stop Anti-inflammatories (NSAIDS) such as Advil , Aleve, Ibuprofen , Motrin , Naproxen, Naprosyn and Aspirin based products such as Excedrin, Goody's Powder, BC Powder.  You may however, continue to take Tylenol  if needed for pain up until the day of surgery  Stop ANY OVER THE COUNTER supplements and vitamins until after surgery.  **Follow guidelines for insulin  and diabetes medications.** OZEMPIC, last dose to be Saturday 08/04/24  Continue taking all of your other prescription medications up until the day of surgery.  ON THE DAY OF SURGERY ONLY TAKE THESE MEDICATIONS WITH SIPS OF WATER:  FLUoxetine  (PROZAC ) 20 MG capsule  pantoprazole  (PROTONIX ) 40 MG tablet  valACYclovir  (VALTREX ) 1000 MG tablet  You can take your alprazolam , fexofenadine or methocarbamol  if needed  Use inhalers  on the day of surgery and bring to the hospital.  No Alcohol for 24 hours before or after surgery.  No Smoking including e-cigarettes for 24 hours before surgery.  No chewable tobacco products for at least 6 hours before surgery.  No nicotine patches on the day of surgery.  Do not use any recreational drugs for at least a week (preferably 2 weeks) before your surgery.  Please be advised that the combination of cocaine and anesthesia may have negative outcomes, up to and including death. If you test positive for cocaine, your surgery will be cancelled.  On the morning of surgery brush your teeth with toothpaste and water, you may rinse your mouth with mouthwash if you wish. Do not swallow any toothpaste or mouthwash.  Use CHG Soap or wipes as directed on instruction sheet.  Do not shave body hair from the neck down 48 hours before surgery.  Do not wear lotions, powders, deodorant or perfumes on the day of surgery  Wear comfortable clothing (specific to your surgery type) to the hospital.  Do not wear jewelry, make-up, hairpins, clips or nail polish.  For welded (permanent) jewelry: bracelets, anklets, waist bands, etc.  Please have this removed prior to surgery.  If it is not removed, there is a chance that hospital personnel will need to cut it off on the day of surgery.  Contact lenses, hearing aids and dentures may not be worn into surgery.  Do not bring valuables to the hospital. [facility] is not responsible for any missing/lost belongings  or valuables.   Bring your C-PAP to the hospital in case you may have to spend the night.   Notify your doctor if there is any change in your medical condition (cold, fever, infection, rashes).  After surgery, you can help prevent lung complications by doing breathing exercises.  Take deep breaths and cough every 1-2 hours. Your doctor may order a device called an Incentive Spirometer to help you take deep breaths.  If you are being  admitted to the hospital overnight, leave your suitcase in the car. After surgery it may be brought to your room.  In case of increased patient census, it may be necessary for you, the patient, to continue your postoperative care in the Same Day Surgery department.  If you are taking public transportation, you will need to have a responsible individual with you.  Please call the Pre-admissions Testing Dept. at [phone] if you have any questions about these instructions.  Surgery Visitation Policy:  Patients having surgery or a procedure may have two visitors.  Children under the age of 57 must have an adult with them who is not the patient.  Inpatient Visitation:    Visiting hours are 7 a.m. to 8 p.m. Up to four visitors are allowed at one time in a patient room. The visitors may rotate out with other people during the day.  One visitor age 29 or older may stay with the patient overnight and must be in the room by 8 p.m.   Merchandiser, retail to address health-related social needs:  [email or web address]    Pre-operative 4 CHG Bath Instructions   You can play a key role in reducing the risk of infection after surgery. Your skin needs to be as free of germs as possible. You can reduce the number of germs on your skin by washing with CHG (chlorhexidine  gluconate) soap before surgery. CHG is an antiseptic soap that kills germs and continues to kill germs even after washing.   DO NOT use if you have an allergy to chlorhexidine /CHG or antibacterial soaps. If your skin becomes reddened or irritated, stop using the CHG and notify one of our RNs at [phone].   Please shower with the CHG soap starting 4 days before surgery using the following schedule:   Thursday 08/09/24 - Sunday 08/12/24    Please keep in mind the following:  DO NOT shave, including legs and underarms, starting the day of your first shower.   You may shave your face at any point before/day  of surgery.  Place clean sheets on your bed the day you start using CHG soap. Use a clean washcloth (not used since being washed) for each shower. DO NOT sleep with pets once you start using the CHG.   CHG Shower Instructions:  If you choose to wash your hair and private area, wash first with your normal shampoo/soap.  After you use shampoo/soap, rinse your hair and body thoroughly to remove shampoo/soap residue.  Turn the water OFF and apply about 3 tablespoons (45 ml) of CHG soap to a CLEAN washcloth.  Apply CHG soap ONLY FROM YOUR NECK DOWN TO YOUR TOES (washing for 3-5 minutes)  DO NOT use CHG soap on face, private areas, open wounds, or sores.  Pay special attention to the area where your surgery is being performed.  If you are having back surgery, having someone wash your back for you may be helpful. Wait 2 minutes after CHG soap is applied, then you may rinse off the  CHG soap.  Pat dry with a clean towel  Put on clean clothes/pajamas   If you choose to wear lotion, please use ONLY the CHG-compatible lotions on the back of this paper.     Additional instructions for the day of surgery: DO NOT APPLY any lotions, deodorants, cologne, or perfumes.   Put on clean/comfortable clothes.  Brush your teeth.  Ask your nurse before applying any prescription medications to the skin.      CHG Compatible Lotions   Aveeno Moisturizing lotion  Cetaphil Moisturizing Cream  Cetaphil Moisturizing Lotion  Clairol Herbal Essence Moisturizing Lotion, Dry Skin  Clairol Herbal Essence Moisturizing Lotion, Extra Dry Skin  Clairol Herbal Essence Moisturizing Lotion, Normal Skin  Curel Age Defying Therapeutic Moisturizing Lotion with Alpha Hydroxy  Curel Extreme Care Body Lotion  Curel Soothing Hands Moisturizing Hand Lotion  Curel Therapeutic Moisturizing Cream, Fragrance-Free  Curel Therapeutic Moisturizing Lotion, Fragrance-Free  Curel Therapeutic Moisturizing Lotion, Original Formula   Eucerin Daily Replenishing Lotion  Eucerin Dry Skin Therapy Plus Alpha Hydroxy Crme  Eucerin Dry Skin Therapy Plus Alpha Hydroxy Lotion  Eucerin Original Crme  Eucerin Original Lotion  Eucerin Plus Crme Eucerin Plus Lotion  Eucerin TriLipid Replenishing Lotion  Keri Anti-Bacterial Hand Lotion  Keri Deep Conditioning Original Lotion Dry Skin Formula Softly Scented  Keri Deep Conditioning Original Lotion, Fragrance Free Sensitive Skin Formula  Keri Lotion Fast Absorbing Fragrance Free Sensitive Skin Formula  Keri Lotion Fast Absorbing Softly Scented Dry Skin Formula  Keri Original Lotion  Keri Skin Renewal Lotion Keri Silky Smooth Lotion  Keri Silky Smooth Sensitive Skin Lotion  Nivea Body Creamy Conditioning Oil  Nivea Body Extra Enriched Teacher, adult education Moisturizing Lotion Nivea Crme  Nivea Skin Firming Lotion  NutraDerm 30 Skin Lotion  NutraDerm Skin Lotion  NutraDerm Therapeutic Skin Cream  NutraDerm Therapeutic Skin Lotion  ProShield Protective Hand Cream  Provon moisturizing lotion

## 2024-08-06 ENCOUNTER — Other Ambulatory Visit

## 2024-08-13 ENCOUNTER — Ambulatory Visit

## 2024-08-13 ENCOUNTER — Ambulatory Visit: Payer: Self-pay | Admitting: Urgent Care

## 2024-08-13 ENCOUNTER — Encounter
Admission: RE | Disposition: A | Discharge status: Home / Self Care | Payer: Self-pay | Source: Ambulatory Visit | Attending: Neurosurgery

## 2024-08-13 ENCOUNTER — Observation Stay
Admission: RE | Admit: 2024-08-13 | Discharge: 2024-08-14 | Disposition: A | Source: Ambulatory Visit | Attending: Neurosurgery | Admitting: Neurosurgery

## 2024-08-13 ENCOUNTER — Other Ambulatory Visit: Payer: Self-pay

## 2024-08-13 ENCOUNTER — Encounter: Payer: Self-pay | Admitting: Neurosurgery

## 2024-08-13 DIAGNOSIS — Z87891 Personal history of nicotine dependence: Secondary | ICD-10-CM | POA: Diagnosis not present

## 2024-08-13 DIAGNOSIS — Z419 Encounter for procedure for purposes other than remedying health state, unspecified: Principal | ICD-10-CM

## 2024-08-13 DIAGNOSIS — M4316 Spondylolisthesis, lumbar region: Secondary | ICD-10-CM | POA: Diagnosis not present

## 2024-08-13 DIAGNOSIS — Z01818 Encounter for other preprocedural examination: Secondary | ICD-10-CM

## 2024-08-13 DIAGNOSIS — J45909 Unspecified asthma, uncomplicated: Secondary | ICD-10-CM | POA: Insufficient documentation

## 2024-08-13 DIAGNOSIS — M4310 Spondylolisthesis, site unspecified: Secondary | ICD-10-CM | POA: Diagnosis not present

## 2024-08-13 DIAGNOSIS — Z91040 Latex allergy status: Secondary | ICD-10-CM | POA: Insufficient documentation

## 2024-08-13 DIAGNOSIS — Z981 Arthrodesis status: Secondary | ICD-10-CM | POA: Insufficient documentation

## 2024-08-13 DIAGNOSIS — F1090 Alcohol use, unspecified, uncomplicated: Secondary | ICD-10-CM | POA: Diagnosis not present

## 2024-08-13 DIAGNOSIS — F411 Generalized anxiety disorder: Secondary | ICD-10-CM | POA: Diagnosis not present

## 2024-08-13 DIAGNOSIS — M5116 Intervertebral disc disorders with radiculopathy, lumbar region: Secondary | ICD-10-CM | POA: Diagnosis not present

## 2024-08-13 DIAGNOSIS — M5442 Lumbago with sciatica, left side: Secondary | ICD-10-CM | POA: Diagnosis not present

## 2024-08-13 DIAGNOSIS — G8929 Other chronic pain: Secondary | ICD-10-CM | POA: Diagnosis not present

## 2024-08-13 DIAGNOSIS — G4733 Obstructive sleep apnea (adult) (pediatric): Secondary | ICD-10-CM | POA: Diagnosis not present

## 2024-08-13 DIAGNOSIS — Z9989 Dependence on other enabling machines and devices: Secondary | ICD-10-CM | POA: Diagnosis not present

## 2024-08-13 HISTORY — PX: TRANSFORAMINAL LUMBAR INTERBODY FUSION W/ MIS 1 LEVEL: SHX6145

## 2024-08-13 HISTORY — PX: APPLICATION OF INTRAOPERATIVE CT SCAN: SHX6668

## 2024-08-13 LAB — ABO/RH: ABO/RH(D): A POS

## 2024-08-13 SURGERY — MINIMALLY INVASIVE (MIS) TRANSFORAMINAL LUMBAR INTERBODY FUSION (TLIF) 1 LEVEL
Anesthesia: General | Site: Spine Lumbar

## 2024-08-13 MED ORDER — CEFAZOLIN SODIUM-DEXTROSE 2-4 GM/100ML-% IV SOLN
INTRAVENOUS | Status: AC
  Filled 2024-08-13: qty 100

## 2024-08-13 MED ORDER — VANCOMYCIN HCL IN DEXTROSE 1-5 GM/200ML-% IV SOLN
INTRAVENOUS | Status: AC
  Filled 2024-08-13: qty 200

## 2024-08-13 MED ORDER — SODIUM CHLORIDE 0.9% FLUSH: 3.0000 mL | INTRAVENOUS | Status: DC | PRN

## 2024-08-13 MED ORDER — PHENYLEPHRINE HCL-NACL 20-0.9 MG/250ML-% IV SOLN
INTRAVENOUS | Status: DC | PRN
  Administered 2024-08-13: 40 ug/min via INTRAVENOUS

## 2024-08-13 MED ORDER — MENTHOL 3 MG MT LOZG: 1.0000 | LOZENGE | OROMUCOSAL | Status: DC | PRN

## 2024-08-13 MED ORDER — CEFAZOLIN IN SODIUM CHLORIDE 2-0.9 GM/100ML-% IV SOLN
2.0000 g | Freq: Once | INTRAVENOUS | Status: AC
  Administered 2024-08-13: 2 g via INTRAVENOUS

## 2024-08-13 MED ORDER — PANTOPRAZOLE SODIUM 40 MG PO TBEC
40.0000 mg | DELAYED_RELEASE_TABLET | Freq: Every day | ORAL | Status: DC
  Administered 2024-08-14: 40 mg via ORAL
  Filled 2024-08-13: qty 1

## 2024-08-13 MED ORDER — SORBITOL 70 % SOLN: 30.0000 mL | Freq: Every day | Status: DC | PRN

## 2024-08-13 MED ORDER — SUCCINYLCHOLINE CHLORIDE 200 MG/10ML IV SOSY
PREFILLED_SYRINGE | INTRAVENOUS | Status: AC
  Filled 2024-08-13: qty 10

## 2024-08-13 MED ORDER — CHLORHEXIDINE GLUCONATE 0.12 % MT SOLN
15.0000 mL | Freq: Once | OROMUCOSAL | Status: AC
  Administered 2024-08-13: 15 mL via OROMUCOSAL

## 2024-08-13 MED ORDER — PROPOFOL 10 MG/ML IV BOLUS
INTRAVENOUS | Status: DC | PRN
  Administered 2024-08-13: 50 mg via INTRAVENOUS
  Administered 2024-08-13: 100 mg via INTRAVENOUS

## 2024-08-13 MED ORDER — FENTANYL CITRATE (PF) 100 MCG/2ML IJ SOLN
INTRAMUSCULAR | Status: DC | PRN
  Administered 2024-08-13 (×2): 50 ug via INTRAVENOUS

## 2024-08-13 MED ORDER — SURGIFLO WITH THROMBIN (HEMOSTATIC MATRIX KIT) OPTIME
TOPICAL | Status: DC | PRN
Start: 2024-08-13 — End: 2024-08-13

## 2024-08-13 MED ORDER — KETOROLAC TROMETHAMINE 15 MG/ML IJ SOLN
15.0000 mg | Freq: Four times a day (QID) | INTRAMUSCULAR | Status: DC
  Administered 2024-08-13 – 2024-08-14 (×3): 15 mg via INTRAVENOUS
  Filled 2024-08-13 (×2): qty 1

## 2024-08-13 MED ORDER — METHOCARBAMOL 500 MG PO TABS
ORAL_TABLET | ORAL | Status: AC
  Filled 2024-08-13: qty 1

## 2024-08-13 MED ORDER — SODIUM CHLORIDE (PF) 0.9 % IJ SOLN
INTRAMUSCULAR | Status: DC | PRN
  Administered 2024-08-13: 60 mL via INTRAMUSCULAR

## 2024-08-13 MED ORDER — NICOTINE 7 MG/24HR TD PT24
7.0000 mg | MEDICATED_PATCH | Freq: Every day | TRANSDERMAL | Status: DC
  Filled 2024-08-13: qty 1

## 2024-08-13 MED ORDER — VALACYCLOVIR HCL 500 MG PO TABS
1000.0000 mg | ORAL_TABLET | Freq: Every day | ORAL | Status: DC
  Filled 2024-08-13: qty 2

## 2024-08-13 MED ORDER — ONDANSETRON HCL 4 MG PO TABS: 4.0000 mg | ORAL_TABLET | Freq: Four times a day (QID) | ORAL | Status: DC | PRN

## 2024-08-13 MED ORDER — LACTATED RINGERS IV SOLN: INTRAVENOUS | Status: DC

## 2024-08-13 MED ORDER — METHOCARBAMOL 500 MG PO TABS
500.0000 mg | ORAL_TABLET | Freq: Four times a day (QID) | ORAL | Status: DC | PRN
  Administered 2024-08-13 – 2024-08-14 (×2): 500 mg via ORAL
  Filled 2024-08-13: qty 1

## 2024-08-13 MED ORDER — FONDAPARINUX SODIUM 2.5 MG/0.5ML ~~LOC~~ SOLN
2.5000 mg | SUBCUTANEOUS | Status: DC
  Administered 2024-08-14: 2.5 mg via SUBCUTANEOUS
  Filled 2024-08-13 (×2): qty 0.5

## 2024-08-13 MED ORDER — DOCUSATE SODIUM 100 MG PO CAPS
100.0000 mg | ORAL_CAPSULE | Freq: Two times a day (BID) | ORAL | Status: DC
  Administered 2024-08-14: 100 mg via ORAL
  Filled 2024-08-13: qty 1

## 2024-08-13 MED ORDER — VANCOMYCIN HCL IN DEXTROSE 1-5 GM/200ML-% IV SOLN
1000.0000 mg | Freq: Once | INTRAVENOUS | Status: AC
  Administered 2024-08-13: 1000 mg via INTRAVENOUS

## 2024-08-13 MED ORDER — BUPIVACAINE-EPINEPHRINE (PF) 0.5% -1:200000 IJ SOLN
INTRAMUSCULAR | Status: DC | PRN
Start: 2024-08-13 — End: 2024-08-13
  Administered 2024-08-13: 10 mL

## 2024-08-13 MED ORDER — BUPIVACAINE LIPOSOME 1.3 % IJ SUSP
INTRAMUSCULAR | Status: AC
  Filled 2024-08-13: qty 20

## 2024-08-13 MED ORDER — ALBUTEROL-BUDESONIDE 90-80 MCG/ACT IN AERO: 2.0000 | INHALATION_SPRAY | Freq: Four times a day (QID) | RESPIRATORY_TRACT | Status: DC | PRN

## 2024-08-13 MED ORDER — GLYCOPYRROLATE 0.2 MG/ML IJ SOLN
INTRAMUSCULAR | Status: DC | PRN
  Administered 2024-08-13: .2 mg via INTRAVENOUS

## 2024-08-13 MED ORDER — CHLORHEXIDINE GLUCONATE 0.12 % MT SOLN
OROMUCOSAL | Status: AC
  Filled 2024-08-13: qty 15

## 2024-08-13 MED ORDER — MUPIROCIN 2 % EX OINT: 1.0000 | TOPICAL_OINTMENT | Freq: Two times a day (BID) | CUTANEOUS | 0 refills | Status: AC

## 2024-08-13 MED ORDER — DEXMEDETOMIDINE HCL IN NACL 80 MCG/20ML IV SOLN
INTRAVENOUS | Status: DC | PRN
  Administered 2024-08-13: 8 ug via INTRAVENOUS

## 2024-08-13 MED ORDER — OXYCODONE HCL 5 MG/5ML PO SOLN: 5.0000 mg | Freq: Once | ORAL | Status: AC | PRN

## 2024-08-13 MED ORDER — ACETAMINOPHEN 10 MG/ML IV SOLN
INTRAVENOUS | Status: AC
  Filled 2024-08-13: qty 100

## 2024-08-13 MED ORDER — FLUTICASONE FUROATE-VILANTEROL 100-25 MCG/ACT IN AEPB
1.0000 | INHALATION_SPRAY | Freq: Every day | RESPIRATORY_TRACT | Status: DC
  Administered 2024-08-14: 1 via RESPIRATORY_TRACT
  Filled 2024-08-13 (×2): qty 28

## 2024-08-13 MED ORDER — FLUOXETINE HCL 20 MG PO CAPS
20.0000 mg | ORAL_CAPSULE | Freq: Every day | ORAL | Status: DC
  Administered 2024-08-14: 20 mg via ORAL
  Filled 2024-08-13: qty 1

## 2024-08-13 MED ORDER — 0.9 % SODIUM CHLORIDE (POUR BTL) OPTIME
TOPICAL | Status: DC | PRN
  Administered 2024-08-13: 500 mL

## 2024-08-13 MED ORDER — ORAL CARE MOUTH RINSE: 15.0000 mL | Freq: Once | OROMUCOSAL | Status: AC

## 2024-08-13 MED ORDER — ACETAMINOPHEN 325 MG PO TABS: 650.0000 mg | ORAL_TABLET | ORAL | Status: DC | PRN

## 2024-08-13 MED ORDER — ONDANSETRON HCL 4 MG/2ML IJ SOLN
INTRAMUSCULAR | Status: AC
  Filled 2024-08-13: qty 2

## 2024-08-13 MED ORDER — ACETAMINOPHEN 10 MG/ML IV SOLN
INTRAVENOUS | Status: DC | PRN
Start: 2024-08-13 — End: 2024-08-13
  Administered 2024-08-13: 1000 mg via INTRAVENOUS

## 2024-08-13 MED ORDER — ALPRAZOLAM 0.25 MG PO TABS
0.5000 mg | ORAL_TABLET | Freq: Three times a day (TID) | ORAL | Status: DC | PRN
  Administered 2024-08-13: 0.5 mg via ORAL
  Filled 2024-08-13: qty 2

## 2024-08-13 MED ORDER — OXYCODONE HCL 5 MG PO TABS: 10.0000 mg | ORAL_TABLET | ORAL | Status: DC | PRN

## 2024-08-13 MED ORDER — OXYCODONE HCL 5 MG PO TABS
5.0000 mg | ORAL_TABLET | Freq: Once | ORAL | Status: AC | PRN
  Administered 2024-08-13: 5 mg via ORAL

## 2024-08-13 MED ORDER — SENNA 8.6 MG PO TABS
1.0000 | ORAL_TABLET | Freq: Two times a day (BID) | ORAL | Status: DC
  Administered 2024-08-14: 8.6 mg via ORAL
  Filled 2024-08-13: qty 1

## 2024-08-13 MED ORDER — SODIUM CHLORIDE (PF) 0.9 % IJ SOLN
INTRAMUSCULAR | Status: AC
  Filled 2024-08-13: qty 20

## 2024-08-13 MED ORDER — MAGNESIUM CITRATE PO SOLN: 1.0000 | Freq: Once | ORAL | Status: DC | PRN

## 2024-08-13 MED ORDER — OXYCODONE HCL 5 MG PO TABS
ORAL_TABLET | ORAL | Status: AC
  Filled 2024-08-13: qty 1

## 2024-08-13 MED ORDER — ACETAMINOPHEN 500 MG PO TABS
1000.0000 mg | ORAL_TABLET | Freq: Four times a day (QID) | ORAL | Status: DC
  Administered 2024-08-13 – 2024-08-14 (×3): 1000 mg via ORAL
  Filled 2024-08-13 (×4): qty 2

## 2024-08-13 MED ORDER — ACETAMINOPHEN 650 MG RE SUPP: 650.0000 mg | RECTAL | Status: DC | PRN

## 2024-08-13 MED ORDER — OXYCODONE HCL 5 MG PO TABS: 5.0000 mg | ORAL_TABLET | ORAL | Status: DC | PRN

## 2024-08-13 MED ORDER — KETAMINE HCL 50 MG/5ML IJ SOSY
PREFILLED_SYRINGE | INTRAMUSCULAR | Status: AC
  Filled 2024-08-13: qty 5

## 2024-08-13 MED ORDER — SUCCINYLCHOLINE CHLORIDE 200 MG/10ML IV SOSY
PREFILLED_SYRINGE | INTRAVENOUS | Status: DC | PRN
  Administered 2024-08-13: 100 mg via INTRAVENOUS

## 2024-08-13 MED ORDER — IRRISEPT - 450ML BOTTLE WITH 0.05% CHG IN STERILE WATER, USP 99.95% OPTIME
TOPICAL | Status: DC | PRN
  Administered 2024-08-13: 450 mL

## 2024-08-13 MED ORDER — LIDOCAINE HCL (CARDIAC) PF 100 MG/5ML IV SOSY
PREFILLED_SYRINGE | INTRAVENOUS | Status: DC | PRN
  Administered 2024-08-13: 100 mg via INTRAVENOUS

## 2024-08-13 MED ORDER — POLYETHYLENE GLYCOL 3350 17 G PO PACK: 17.0000 g | PACK | Freq: Every day | ORAL | Status: DC | PRN

## 2024-08-13 MED ORDER — GLYCOPYRROLATE 0.2 MG/ML IJ SOLN
INTRAMUSCULAR | Status: AC
  Filled 2024-08-13: qty 1

## 2024-08-13 MED ORDER — BUDESONIDE 0.25 MG/2ML IN SUSP: 0.2500 mg | Freq: Four times a day (QID) | RESPIRATORY_TRACT | Status: DC | PRN

## 2024-08-13 MED ORDER — ONDANSETRON HCL 4 MG/2ML IJ SOLN
INTRAMUSCULAR | Status: DC | PRN
  Administered 2024-08-13: 4 mg via INTRAVENOUS

## 2024-08-13 MED ORDER — FENTANYL CITRATE (PF) 100 MCG/2ML IJ SOLN
INTRAMUSCULAR | Status: AC
  Filled 2024-08-13: qty 2

## 2024-08-13 MED ORDER — ONDANSETRON HCL 4 MG/2ML IJ SOLN: 4.0000 mg | Freq: Four times a day (QID) | INTRAMUSCULAR | Status: DC | PRN

## 2024-08-13 MED ORDER — PROPOFOL 10 MG/ML IV BOLUS
INTRAVENOUS | Status: AC
  Filled 2024-08-13: qty 20

## 2024-08-13 MED ORDER — ONDANSETRON HCL 4 MG/2ML IJ SOLN: 4.0000 mg | Freq: Once | INTRAMUSCULAR | Status: DC | PRN

## 2024-08-13 MED ORDER — MIDAZOLAM HCL 2 MG/2ML IJ SOLN
INTRAMUSCULAR | Status: AC
  Filled 2024-08-13: qty 2

## 2024-08-13 MED ORDER — AZELAIC ACID 15 % EX GEL: 1.0000 | Freq: Every evening | CUTANEOUS | Status: DC | PRN

## 2024-08-13 MED ORDER — BUPIVACAINE HCL (PF) 0.5 % IJ SOLN
INTRAMUSCULAR | Status: AC
  Filled 2024-08-13: qty 30

## 2024-08-13 MED ORDER — METHOCARBAMOL 1000 MG/10ML IJ SOLN: 500.0000 mg | Freq: Four times a day (QID) | INTRAMUSCULAR | Status: DC | PRN

## 2024-08-13 MED ORDER — ALBUTEROL SULFATE (2.5 MG/3ML) 0.083% IN NEBU
2.5000 mg | INHALATION_SOLUTION | Freq: Four times a day (QID) | RESPIRATORY_TRACT | Status: DC | PRN
Start: 2024-08-13 — End: 2024-08-14

## 2024-08-13 MED ORDER — FENTANYL CITRATE (PF) 100 MCG/2ML IJ SOLN
25.0000 ug | INTRAMUSCULAR | Status: AC | PRN
  Administered 2024-08-13 (×8): 25 ug via INTRAVENOUS

## 2024-08-13 MED ORDER — MIDAZOLAM HCL (PF) 2 MG/2ML IJ SOLN
INTRAMUSCULAR | Status: DC | PRN
  Administered 2024-08-13: 2 mg via INTRAVENOUS

## 2024-08-13 MED ORDER — SODIUM CHLORIDE 0.9% FLUSH
3.0000 mL | Freq: Two times a day (BID) | INTRAVENOUS | Status: DC
  Administered 2024-08-14: 3 mL via INTRAVENOUS

## 2024-08-13 MED ORDER — PHENOL 1.4 % MT LIQD: 1.0000 | OROMUCOSAL | Status: DC | PRN

## 2024-08-13 MED ORDER — SODIUM CHLORIDE 0.9 % IV SOLN
250.0000 mL | INTRAVENOUS | Status: DC
  Administered 2024-08-13: 1000 mL via INTRAVENOUS

## 2024-08-13 MED ORDER — HYDROMORPHONE HCL 1 MG/ML IJ SOLN
0.5000 mg | INTRAMUSCULAR | Status: AC | PRN
  Administered 2024-08-13: 0.5 mg via INTRAVENOUS
  Filled 2024-08-13: qty 0.5

## 2024-08-13 MED ORDER — KETAMINE HCL 50 MG/5ML IJ SOSY
PREFILLED_SYRINGE | INTRAMUSCULAR | Status: DC | PRN
  Administered 2024-08-13: 30 mg via INTRAVENOUS
  Administered 2024-08-13: 10 mg via INTRAVENOUS

## 2024-08-13 MED ORDER — CHLORHEXIDINE GLUCONATE 4 % EX SOLN: 1.0000 | CUTANEOUS | 1 refills | Status: DC

## 2024-08-13 MED ORDER — DEXAMETHASONE SOD PHOSPHATE PF 10 MG/ML IJ SOLN
INTRAMUSCULAR | Status: DC | PRN
  Administered 2024-08-13: 10 mg via INTRAVENOUS

## 2024-08-13 MED ORDER — KETOROLAC TROMETHAMINE 15 MG/ML IJ SOLN
INTRAMUSCULAR | Status: AC
  Filled 2024-08-13: qty 1

## 2024-08-13 SURGICAL SUPPLY — 51 items
ALLOGRAFT BONE FIBER KORE 10CC (Bone Implant) IMPLANT
BASIN KIT SINGLE STR (MISCELLANEOUS) ×2 IMPLANT
BUR NEURO DRILL SOFT 3.0X3.8M (BURR) ×2 IMPLANT
CAGE SABLE 10X26 8D (Cage) IMPLANT
DERMABOND ADVANCED .7 DNX12 (GAUZE/BANDAGES/DRESSINGS) ×2 IMPLANT
DRAPE C-ARMOR (DRAPES) IMPLANT
DRAPE LAPAROTOMY 100X77 ABD (DRAPES) ×2 IMPLANT
DRAPE SCAN PATIENT (DRAPES) ×2 IMPLANT
DRAPE SPINE LEICA/WILD 54X150 (DRAPES) IMPLANT
DRAPE TABLE BACK 80X90 (DRAPES) ×2 IMPLANT
DRSG OPSITE POSTOP 4X6 (GAUZE/BANDAGES/DRESSINGS) IMPLANT
DRSG OPSITE POSTOP 4X8 (GAUZE/BANDAGES/DRESSINGS) IMPLANT
DRSG TEGADERM 4X4.75 (GAUZE/BANDAGES/DRESSINGS) IMPLANT
ELECTRODE EZSTD 165MM 6.5IN (MISCELLANEOUS) IMPLANT
ELECTRODE REM PT RTRN 9FT ADLT (ELECTROSURGICAL) ×2 IMPLANT
EVACUATOR 1/8 PVC DRAIN (DRAIN) IMPLANT
EX-PIN ORTHOLOCK NAV 4X150 (PIN) IMPLANT
FEE CVG SUPP BRAINLAB NG SPNE (MISCELLANEOUS) ×2 IMPLANT
FEE INTRAOP CADWELL SUPPLY NCS (MISCELLANEOUS) IMPLANT
FEE INTRAOP MONITOR IMPULS NCS (MISCELLANEOUS) IMPLANT
GAUZE 4X4 16PLY ~~LOC~~+RFID DBL (SPONGE) IMPLANT
GAUZE SPONGE 2X2 STRL 8-PLY (GAUZE/BANDAGES/DRESSINGS) IMPLANT
GLOVE BIOGEL PI IND STRL 6.5 (GLOVE) ×4 IMPLANT
GLOVE SURG SYN 6.5 PF PI (GLOVE) ×4 IMPLANT
GLOVE SURG SYN 8.5 PF PI (GLOVE) ×8 IMPLANT
GOWN SRG LRG LVL 4 IMPRV REINF (GOWNS) ×6 IMPLANT
GOWN SRG XL LVL 3 NONREINFORCE (GOWNS) ×2 IMPLANT
GUIDEWIRE NITINOL BEVEL TIP (WIRE) IMPLANT
HOLDER FOLEY CATH W/STRAP (MISCELLANEOUS) IMPLANT
KIT DISP MARS 3V (KITS) IMPLANT
KIT PREVENA INCISION MGT 13 (CANNISTER) IMPLANT
KIT SPINAL PRONEVIEW (KITS) ×2 IMPLANT
LAVAGE JET IRRISEPT WOUND (IRRIGATION / IRRIGATOR) ×2 IMPLANT
MANIFOLD NEPTUNE II (INSTRUMENTS) ×2 IMPLANT
MARKER SPHERE PSV REFLC 13MM (MARKER) ×14 IMPLANT
NDL SAFETY ECLIP 18X1.5 (MISCELLANEOUS) ×2 IMPLANT
NS IRRIG 500ML POUR BTL (IV SOLUTION) ×2 IMPLANT
PACK LAMINECTOMY ARMC (PACKS) ×2 IMPLANT
ROD RELINE MAS TI 5.5X35MM LRD (Rod) IMPLANT
SCREW LOCK RELINE 5.5 TULIP (Screw) IMPLANT
SCREW RELINE RED 6.5X45MM POLY (Screw) IMPLANT
SCREW RELINE RED 6.5X50MM POLY (Screw) IMPLANT
STAPLER SKIN PROX 35W (STAPLE) IMPLANT
SUT STRATA 3-0 15 PS-2 (SUTURE) ×2 IMPLANT
SUT VIC AB 0 CT1 27XCR 8 STRN (SUTURE) ×4 IMPLANT
SUT VIC AB 2-0 CT1 18 (SUTURE) ×4 IMPLANT
SUTURE EHLN 3-0 FS-10 30 BLK (SUTURE) IMPLANT
SYR 30ML LL (SYRINGE) ×4 IMPLANT
TOWEL OR 17X26 4PK STRL BLUE (TOWEL DISPOSABLE) ×2 IMPLANT
TRAP FLUID SMOKE EVACUATOR (MISCELLANEOUS) ×2 IMPLANT
TRAY FOLEY SLVR 16FR LF STAT (SET/KITS/TRAYS/PACK) IMPLANT

## 2024-08-13 NOTE — H&P (Signed)
 Referring Physician:  Clois Fret, MD [address] [place],  [place] [address]  Primary Physician:  Daniel Cyndee Jamee JONELLE, DO  History of Present Illness: 08/13/2024 [name] presents today with continued symptoms.  07/05/2024 [name] returns to see me with continued symptoms of left leg pain.    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  He has had ongoing back and right leg pain.  He has had intermittent pain for years.  He continues to have pain.  He is going back for physical therapy reevaluation today.  He smoked his last cigar 2 days ago.   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  He began having symptoms on the R a few days ago.  His pain peaked last night.  He did not have paresthesias. He had mostly R sided pain into his buttock.  He had trouble walking yesterday.    This is his 4th episode of pain in the past year.  Each episode lasts a few weeks, then he has an interval of reasonable pain for a couple of weeks before he has another episode of pain.    He had a L-sided injection at L5/S1 today.   He had an injection on the left side a few weeks ago without much impact to his symptoms.    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  He has been doing some exercises, but not started physical therapy.  He has had 1 injection which did help.  He has another injection scheduled.       08/25/2023 [name] is here today with a chief complaint of intermittent low back pain that began in December 2023.  He went on a hike in December and had onset of back pain.  It subsequently improved, but he had an additional episode that began in April 2024.  Since that time, he also began having pain into his left buttock and numbness and weakness in his left foot.  His numbness on the bottom of the foot and he has some pain around his ankle.  His pain can get as bad as 7 out of 10 with  walking or standing.  Sitting or laying down makes it improved.  He feels the pain almost immediately when he stands.  He has pain within walking 50 feet.   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   The symptoms are causing a significant impact on the patient's life.    I have utilized the care everywhere function in epic to review the outside records available from external health systems.    Review of Systems:  A 10 point review of systems is negative, except for the pertinent positives and negatives detailed in the HPI.  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 08/2009    Allergies: Allergies as of 07/12/2024 - Review Complete 07/05/2024  Allergen Reaction Noted    Alpha-gal Hives 12/16/2023   Demerol [meperidine hcl]  08/27/2011   Food  08/22/2018   Latex     Morphine and codeine  08/27/2011   Other Other (See Comments) 08/22/2018    Medications:  Current Facility-Administered Medications:    ceFAZolin  (ANCEF ) IVPB 2g/100 mL premix, 2 g, Intravenous, Once, Daniel Fret, MD   chlorhexidine  (PERIDEX ) 0.12 % solution 15 mL, 15 mL, Mouth/Throat, Once **OR** Oral care mouth rinse, 15 mL, Mouth Rinse, Once, Chesley Lendia LITTIE, MD   lactated ringers  infusion, , Intravenous, Continuous, Oliver, Tesia L, MD   vancomycin (VANCOCIN) IVPB 1000 mg/200 mL premix, 1,000 mg, Intravenous, Once, Daniel Fret, MD  Social History: Social History   Tobacco Use   Smoking status: Former    Average packs/day: 0.2 packs/day for 43.7 years (10.9 ttl pk-yrs)    Types: Cigarettes, Cigars    Start date: 10/11/1980   Smokeless tobacco: Never   Tobacco comments:    Quit 06/11/2024  Vaping Use   Vaping status: Never Used  Substance Use Topics   Alcohol use: Yes    Alcohol/week: 0.0 standard drinks of alcohol    Comment: occ   Drug use: No    Family Medical History: Family History  Problem Relation Age of Onset   Sjogren's syndrome Mother    COPD Mother    Colon polyps Father    Dementia Father    Throat cancer Father    Melanoma Brother    Colon cancer Neg Hx    Esophageal cancer Neg Hx    Stomach cancer Neg Hx    Rectal cancer Neg Hx    Liver disease Neg Hx     Physical Examination: Vitals:   08/13/24 1135  BP: 122/86  Pulse: 63  Resp: 18  Temp: 97.6 F (36.4 C)  SpO2: 97%    Heart sounds normal no MRG. Chest Clear to Auscultation Bilaterally.  CNI MAEW 5/5 throughout L L5 distributions altered sensation and paiin  Medical Decision Making  Imaging: MRI L spine 08/27/2023 IMPRESSION: 1. Interval resolution of the previously seen right subarticular disc extrusion at L5-S1. Unchanged severe left and moderate right neural  foraminal narrowing at this level. 2. Moderate bilateral neural foraminal narrowing at L4-5, slightly worse on the right. 3. Moderate right neural foraminal narrowing at L3-4.     Electronically Signed   By: Ryan Chess M.D.   On: 08/31/2023 09:43   I have reviewed his x-rays as well.  He has some retrolisthesis of L4 and L5.  He has history of a synovial cyst at this level suggesting instability.  I have personally reviewed the images and agree with the above interpretation.  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  He has previously had multiple episodes of left-sided sciatica and right sided sciatica.  Over the past year, his episodes have being longer in his asymptomatic intervals and been getting shorter.  He tried physical therapy without substantial long-term improvement.  He is returning for reevaluation today.  He has had multiple injections.  At this point, he has tried and failed conservative management extensively. We discussed that he has bilateral L5 compression which I think is symptomatic. I would like to get flexion-extension radiographs of his lower back. Because of his retrolisthesis and the position of his posterior superior iliac crest, far lateral foraminotomy at L5-S1 is not possible. As such, we will plan for L5-S1 transforaminal lumbar interbody fusion.       Mark Hassey K. Clois MD, [facility] Neurosurgery

## 2024-08-13 NOTE — Anesthesia Procedure Notes (Signed)
 Procedure Name: Intubation Date/Time: 08/13/2024 1:08 PM  Performed by: Jackye Spanner, CRNAPre-anesthesia Checklist: Patient identified, Patient being monitored, Timeout performed, Emergency Drugs available and Suction available Patient Re-evaluated:Patient Re-evaluated prior to induction Oxygen Delivery Method: Circle system utilized Preoxygenation: Pre-oxygenation with 100% oxygen Induction Type: IV induction Ventilation: Mask ventilation without difficulty Laryngoscope Size: 3 and McGrath Grade View: Grade I Tube type: Oral Tube size: 7.5 mm Number of attempts: 1 Airway Equipment and Method: Stylet Placement Confirmation: ETT inserted through vocal cords under direct vision, positive ETCO2 and breath sounds checked- equal and bilateral Secured at: 22 cm Tube secured with: Tape Dental Injury: Teeth and Oropharynx as per pre-operative assessment  Comments: Smooth atraumatic intubation, no complications noted.

## 2024-08-13 NOTE — Anesthesia Preprocedure Evaluation (Addendum)
 Anesthesia Evaluation  Patient identified by MRN, date of birth, ID band Patient awake    Reviewed: Allergy & Precautions, NPO status , Patient's Chart, lab work & pertinent test results  Airway Mallampati: II  TM Distance: >3 FB Neck ROM: Full    Dental  (+) Teeth Intact   Pulmonary asthma , sleep apnea and Continuous Positive Airway Pressure Ventilation , former smoker   Pulmonary exam normal        Cardiovascular Exercise Tolerance: Good negative cardio ROS Normal cardiovascular exam Rhythm:Regular Rate:Normal     Neuro/Psych   Anxiety     negative neurological ROS  negative psych ROS   GI/Hepatic negative GI ROS, Neg liver ROS,GERD  Medicated,,  Endo/Other  negative endocrine ROS    Renal/GU negative Renal ROS  negative genitourinary   Musculoskeletal   Abdominal Normal abdominal exam  (+)   Peds negative pediatric ROS (+)  Hematology negative hematology ROS (+)   Anesthesia Other Findings Past Medical History: No date: Anxiety state, unspecified No date: Asthma 2008: Atypical mole No date: Complication of anesthesia     Comment:  had some difficulty voiding No date: Dermatitis     Comment:  anal dermatitis No date: GERD (gastroesophageal reflux disease) No date: Hemorrhoids No date: Herpes simplex without mention of complication No date: 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     Comment:  testicular-radiation post op No date: Numbness and tingling No date: Other and unspecified anterior pituitary hyperfunction No date: Sleep apnea No date: Unspecified asthma(493.90)     Comment:  controlled  Past Surgical History: No date: APPENDECTOMY 09/01/2011: KNEE ARTHROSCOPY     Comment:  Procedure: ARTHROSCOPY KNEE;  Surgeon: Norleen LITTIE Gavel;                Location: [facility];  Service:               Orthopedics;  Laterality: Left;  medial and lateral               meniscus  tear debridment  and chondroplasty No date: LASIK No date: ORCHIECTOMY     Comment:  Lt. 08/2009     Reproductive/Obstetrics negative OB ROS                              Anesthesia Physical Anesthesia Plan  ASA: 3  Anesthesia Plan: General   Post-op Pain Management:    Induction: Intravenous  PONV Risk Score and Plan: Ondansetron , Dexamethasone , Midazolam  and Treatment may vary due to age or medical condition  Airway Management Planned: Oral ETT  Additional Equipment:   Intra-op Plan:   Post-operative Plan: Extubation in OR  Informed Consent: I have reviewed the patients History and Physical, chart, labs and discussed the procedure including the risks, benefits and alternatives for the proposed anesthesia with the patient or authorized representative who has indicated his/her understanding and acceptance.     Dental Advisory Given  Plan Discussed with: CRNA  Anesthesia Plan Comments:          Anesthesia Quick Evaluation

## 2024-08-13 NOTE — Anesthesia Postprocedure Evaluation (Signed)
 Anesthesia Post Note  Patient: Aloysius FORBES Mech, MD  Procedure(s) Performed: LEFT L5-S1 MINIMALLY INVASIVE (MIS) TRANSFORAMINAL LUMBAR INTERBODY FUSION (TLIF) (Left) APPLICATION OF INTRAOPERATIVE CT SCAN (Spine Lumbar)  Patient location during evaluation: PACU Anesthesia Type: General Level of consciousness: awake and alert Pain management: pain level controlled Vital Signs Assessment: post-procedure vital signs reviewed and stable Respiratory status: spontaneous breathing, nonlabored ventilation and respiratory function stable Cardiovascular status: blood pressure returned to baseline and stable Postop Assessment: no apparent nausea or vomiting Anesthetic complications: no   No notable events documented.   Last Vitals:  Vitals:   08/13/24 1800 08/13/24 1810  BP: 110/73 117/76  Pulse: 63 73  Resp: (!) 8 12  Temp:  36.6 C  SpO2: 93% 97%    Last Pain:  Vitals:   08/13/24 1900  TempSrc:   PainSc: 7                  Fairy POUR Mak Bonny

## 2024-08-13 NOTE — Plan of Care (Signed)
 Received in Surge room 19.  Oriented to room. Wife at bedside.

## 2024-08-13 NOTE — Op Note (Signed)
 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 The patient tried and failed conservative management, prompting surgical intervention.  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   Preoperative Note:   Risks of surgery discussed include: infection, bleeding, stroke, coma, death, paralysis, CSF leak, nerve/spinal cord injury, numbness, tingling, weakness, complex regional pain syndrome, recurrent stenosis and/or disc herniation, vascular injury, development of instability, neck/back pain, need for further surgery, persistent symptoms, development of deformity, and the risks of anesthesia. The patient understood these risks and agreed to proceed.  Operative Note:  1. Transforaminal Lumbar Interbody Fusion L5/S1 2. Posterolateral arthrodesis L5 to S1 3. Posterior nonsegmental instrumentation L5 to S1 using Nuvasive Reline 4. Use of stereotaxis ([employer]) 5. Harvesting of autograft via the same incision 6. Placement of a biomechanical device (Globus Sable) at L5/S1 for anterior arthrodesis  The patient was brought to the Operating Room, intubated and turned into the prone position. All pressure points were checked and double checked. The patient was prepped and draped in the standard fashion. A full timeout was performed. Preoperative antibiotics were given.   After draping, the stereotactic array was placed.  Stereotactic imaging was acquired and registered to the [place] system.  The image guidance system was used to choose bilateral [place] incisions. The incisions were injected with local  anesthetic.  Each incision was opened with a scalpel, bovie electrocautery was used to open the fascia.  Using stereotactic guidance, each pedicle from L5-S1 was cannulated and K wire secured.  We then placed pedicle screws over each K wire on the contralateral side.  We placed the ipsilateral pedicle screws after the TLIF procedure was performed.  6.5 mm Nuvasive Reline screws were used bilaterally at each level.   After placement of pedicle screws, we then turned our attention to transforaminal lumbar interbody fusion.  A tubular retractor was advanced over the left L5/S1 facet. The microscope was brought into the field.  The left L5/S1 facet was removed with osteotomes and the drill, and handed off for preparation as autograft. The traversing and exiting nerve roots on the left were identified and protected. The disc was opened using a scalpel. After incising the disc space, we took a combination of pituitary rongeurs, Kerrison rongeurs, disc scrapers, and curettes to remove a majority of the disc material.  We prepared the end plates for accepting the interbody fusion.  We removed the cartilaginous plate, preserved the cortical endplate if possible during this procedure.  The disc space was irrigated.  The Globus Sable biomechanical device was inserted, then backfilled with a mixture of allograft and autograft. During placement, the nerve roots and dural sac were carefully protected without any leaks identified. After placement of the device, the canal was fully inspected and all nerve roots were checked to ensure full decompression.  The retractor was removed.  Rods measured and placed. The rods were secured using locking caps to manufacturer's specifications. CT scan was taken to confirm instrumentation placement. The wound was copiously irrigated, then the posterior elements were prepared for arthrodesis.  A mixture of allograft and autograft was placed over the decorticated surfaces for arthrodesis.    A subfascial drain was placed on the left.  After hemostasis, the wound was closed in layers with 0 and 2-0 vicryl. 3-0 monocryl and dermabond was applied to the incision. A sterile dressing was placed.  The patient was then flipped supine and extubated with incident. All counts were correct times 2 at the end of the case. No immediate complications were noted.  Edsel Goods PA assisted in the entire procedure. An assistant was required for this procedure due to the complexity.  The assistant provided assistance in tissue manipulation and suction, and was required for the successful and safe performance of the procedure. I performed the critical portions of the procedure.   Reeves Daisy MD

## 2024-08-13 NOTE — Transfer of Care (Signed)
 Immediate Anesthesia Transfer of Care Note  Patient: Daniel Lam Mech, MD  Procedure(s) Performed: LEFT L5-S1 MINIMALLY INVASIVE (MIS) TRANSFORAMINAL LUMBAR INTERBODY FUSION (TLIF) (Left) APPLICATION OF INTRAOPERATIVE CT SCAN (Spine Lumbar)  Patient Location: PACU  Anesthesia Type:General  Level of Consciousness: drowsy  Airway & Oxygen Therapy: Patient Spontanous Breathing and Patient connected to face mask oxygen  Post-op Assessment: Report given to RN and Post -op Vital signs reviewed and stable  Post vital signs: Reviewed and stable  Last Vitals:  Vitals Value Taken Time  BP 125/68 08/13/24 16:03  Temp 35.8 1603  Pulse 73 08/13/24 16:07  Resp 13 08/13/24 16:07  SpO2 97 % 08/13/24 16:07  Vitals shown include unfiled device data.  Last Pain:  Vitals:   08/13/24 1135  TempSrc: Temporal  PainSc: 0-No pain         Complications: No notable events documented.

## 2024-08-13 NOTE — Discharge Instructions (Signed)
 Your surgeon has performed an operation on your lumbar spine (low back) to relieve pressure on one or more nerves. Many times, patients feel better immediately after surgery and can "overdo it." Even if you feel well, it is important that you follow these activity guidelines. If you do not let your back heal properly from the surgery, you can increase the chance of hardware complications and/or return of your symptoms. The following are instructions to help in your recovery once you have been discharged from the hospital.  Do not use NSAIDs for 3 months after surgery.  *Regarding compression stockings-  Please wear day and night until you are walking a couple hundred feet three times a day.   Activity    No bending, lifting, or twisting ("BLT"). Avoid lifting objects heavier than 10 pounds (gallon milk jug).  Where possible, avoid household activities that involve lifting, bending, pushing, or pulling such as laundry, vacuuming, grocery shopping, and childcare. Try to arrange for help from friends and family for these activities while your back heals.  Increase physical activity slowly as tolerated.  Taking short walks is encouraged, but avoid strenuous exercise. Do not jog, run, bicycle, lift weights, or participate in any other exercises unless specifically allowed by your doctor. Avoid prolonged sitting, including car rides.  Talk to your doctor before resuming sexual activity.  You should not drive until cleared by your doctor.  Until released by your doctor, you should not return to work or school.  You should rest at home and let your body heal.   You may shower three days after your surgery.  After showering, lightly dab your incision dry. Do not take a tub bath or go swimming for 3 weeks, or until approved by your doctor at your follow-up appointment.  If you smoke, we strongly recommend that you quit.  Smoking has been proven to interfere with normal healing in your back and will  dramatically reduce the success rate of your surgery. Please contact [facility] (800-QUIT-NOW) and use the resources at www.[facility].com for assistance in stopping smoking.  Surgical Incision   If you have a dressing on your incision, you may remove it three days after your surgery. Keep your incision area clean and dry.  Your incision was closed with Dermabond glue. The glue should begin to peel away within about a week.  Diet            You may return to your usual diet. Be sure to stay hydrated.  When to Contact Us   Although your surgery and recovery will likely be uneventful, you may have some residual numbness, aches, and pains in your back and/or legs. This is normal and should improve in the next few weeks.  However, should you experience any of the following, contact us  immediately: New numbness or weakness Pain that is progressively getting worse, and is not relieved by your pain medications or rest Bleeding, redness, swelling, pain, or drainage from surgical incision Chills or flu-like symptoms Fever greater than 101.0 F (38.3 C) Problems with bowel or bladder functions Difficulty breathing or shortness of breath Warmth, tenderness, or swelling in your calf  Contact Information How to contact us :  If you have any questions/concerns before or after surgery, you can reach us  at [phone], or you can send a mychart message. We can be reached by phone or mychart 8am-4pm, Monday-Friday.  *Please note: Calls after 4pm are forwarded to a third party answering service. Mychart messages are not routinely monitored during evenings,  weekends, and holidays. Please call our office to contact the answering service for urgent concerns during non-business hours.

## 2024-08-14 ENCOUNTER — Other Ambulatory Visit: Payer: Self-pay

## 2024-08-14 ENCOUNTER — Encounter: Payer: Self-pay | Admitting: Neurosurgery

## 2024-08-14 DIAGNOSIS — Z87891 Personal history of nicotine dependence: Secondary | ICD-10-CM | POA: Diagnosis not present

## 2024-08-14 DIAGNOSIS — M4316 Spondylolisthesis, lumbar region: Secondary | ICD-10-CM | POA: Diagnosis not present

## 2024-08-14 DIAGNOSIS — J45909 Unspecified asthma, uncomplicated: Secondary | ICD-10-CM | POA: Diagnosis not present

## 2024-08-14 DIAGNOSIS — Z981 Arthrodesis status: Secondary | ICD-10-CM | POA: Diagnosis not present

## 2024-08-14 DIAGNOSIS — Z91040 Latex allergy status: Secondary | ICD-10-CM | POA: Diagnosis not present

## 2024-08-14 DIAGNOSIS — F1090 Alcohol use, unspecified, uncomplicated: Secondary | ICD-10-CM | POA: Diagnosis not present

## 2024-08-14 DIAGNOSIS — G8929 Other chronic pain: Secondary | ICD-10-CM | POA: Diagnosis not present

## 2024-08-14 MED ORDER — OXYCODONE HCL 5 MG PO TABS
5.0000 mg | ORAL_TABLET | ORAL | 0 refills | Status: DC | PRN
  Filled 2024-08-14: qty 30, 5d supply, fill #0

## 2024-08-14 MED ORDER — METHOCARBAMOL 500 MG PO TABS
500.0000 mg | ORAL_TABLET | Freq: Four times a day (QID) | ORAL | 0 refills | Status: DC | PRN
  Filled 2024-08-14: qty 90, 23d supply, fill #0

## 2024-08-14 MED ORDER — TAMSULOSIN HCL 0.4 MG PO CAPS
0.4000 mg | ORAL_CAPSULE | Freq: Every day | ORAL | 0 refills | Status: DC
  Filled 2024-08-14: qty 30, 30d supply, fill #0

## 2024-08-14 MED ORDER — POLYETHYLENE GLYCOL 3350 17 GM/SCOOP PO POWD
17.0000 g | Freq: Every day | ORAL | 0 refills | Status: AC | PRN
  Filled 2024-08-14: qty 238, 14d supply, fill #0

## 2024-08-14 MED ORDER — CELECOXIB 200 MG PO CAPS
200.0000 mg | ORAL_CAPSULE | Freq: Two times a day (BID) | ORAL | 0 refills | Status: AC
Start: 2024-08-14 — End: 2024-11-12
  Filled 2024-08-14: qty 60, 30d supply, fill #0

## 2024-08-14 NOTE — Progress Notes (Signed)
 Patient requested foley to stay for couple more hours - RN will inform on coming staff of request.

## 2024-08-14 NOTE — TOC CM/SW Note (Signed)
 Transition of Care (TOC) CM/SW Note   .Patient is not able to walk the distance required to go the bathroom, or he/she is unable to safely negotiate stairs required to access the bathroom.  A 3in1 BSC will alleviate this problem

## 2024-08-14 NOTE — Evaluation (Signed)
 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  He has previously had multiple episodes of left-sided sciatica and right sided sciatica.  Over the past year, his episodes have being longer in his asymptomatic intervals and been getting shorter.  He tried physical therapy without substantial long-term improvement. He underwent L5-S1 transforaminal lumbar interbody fusion on 08/13/24 with Dr. Clois.   Clinical Impression  Pt admitted with above diagnosis. Pt currently with functional limitations due to the deficits listed below (see PT Problem List). Pt received upright in recliner agreeable to PT. Reports PTA being independent with gait and ADL's.   To date, reviewed education as listed below. Able to don pajama pants in sitting at supervision maintaining BLT precautions. Stands and ambulates at supervision level with cautious, minimal step through gait but good balance/stability. Able to complete 16 steps for stair training with good completion with rail varying from step to and alternating patterns. Pt returning to recliner with all needs in reach. Discussion on d/c recs as indicated for recreational goals and tissue healing timelines. Pt and family understanding. Pt has met all needed PT goals for safe d/c.           If plan is discharge home, recommend the following: A little help with walking and/or transfers;Assistance with cooking/housework;Help with stairs or ramp for entrance;Assist for transportation   Can travel by private vehicle        Equipment Recommendations BSC/3in1  Recommendations for Other Services       Functional Status Assessment Patient has had a recent decline in their functional status and demonstrates the ability to make significant improvements in function in a reasonable and  predictable amount of time.     Precautions / Restrictions Precautions Precautions: Back Precaution Booklet Issued: Yes (comment) Recall of Precautions/Restrictions: Intact Restrictions Weight Bearing Restrictions Per Provider Order: No      Mobility  Bed Mobility               General bed mobility comments: NT. In recliner pre and post session Patient Response: Cooperative  Transfers Overall transfer level: Needs assistance Equipment used: Rolling walker (2 wheels) Transfers: Sit to/from Stand Sit to Stand: Supervision                Ambulation/Gait Ambulation/Gait assistance: Supervision Gait Distance (Feet): 250 Feet Assistive device: None Gait Pattern/deviations: Step-through pattern, Decreased step length - right, Decreased step length - left       General Gait Details: decreased step through pattern. Gait in general cautious but good balance/stability  Stairs Stairs: Yes Stairs assistance: Supervision Stair Management: One rail Right, Alternating pattern, Step to pattern, Forwards Number of Stairs: 4 General stair comments: asc/desc 4x with single rail use. Mix of alternating and step to pattern. Good balance/stability with steps using rail safely for both techniques.  Wheelchair Mobility     Tilt Bed Tilt Bed Patient Response: Cooperative  Modified Rankin (Stroke Patients Only)       Balance Overall balance assessment: Needs assistance Sitting-balance support: Feet supported, No upper extremity supported Sitting balance-Leahy Scale: Normal     Standing balance support: No upper extremity supported Standing balance-Leahy Scale: Fair  Pertinent Vitals/Pain Pain Assessment Pain Assessment: 0-10 Pain Score: 4  Pain Location: back Pain Descriptors / Indicators: Aching Pain Intervention(s): Monitored during session    Home Living Family/patient expects to be discharged to:: Private  residence Living Arrangements: Spouse/significant other Available Help at Discharge: Family Type of Home: House         Home Layout: Able to live on main level with bedroom/bathroom Home Equipment: Other (comment) (walking stick)      Prior Function Prior Level of Function : Independent/Modified Independent             Mobility Comments: active and independent, biking/walking ADLs Comments: independent     Extremity/Trunk Assessment   Upper Extremity Assessment Upper Extremity Assessment: Overall WFL for tasks assessed    Lower Extremity Assessment Lower Extremity Assessment: Overall WFL for tasks assessed       Communication   Communication Communication: No apparent difficulties    Cognition                                         Cueing       General Comments General comments (skin integrity, edema, etc.): drain intact    Exercises Other Exercises Other Exercises: reviewed log roll, no indications for lumbar brace per MD orders, stair training, BLT precautions   Assessment/Plan    PT Assessment Patient needs continued PT services  PT Problem List Decreased strength;Decreased activity tolerance;Decreased balance;Decreased mobility;Decreased knowledge of precautions       PT Treatment Interventions DME instruction;Gait training;Patient/family education;Stair training;Functional mobility training;Therapeutic activities;Therapeutic exercise;Balance training;Neuromuscular re-education    PT Goals (Current goals can be found in the Care Plan section)  Acute Rehab PT Goals Patient Stated Goal: to go home, return to recreational activity PT Goal Formulation: With patient Time For Goal Achievement: 08/28/24 Potential to Achieve Goals: Good    Frequency 7X/week     Co-evaluation               AM-PAC PT 6 Clicks Mobility  Outcome Measure Help needed turning from your back to your side while in a flat bed without using  bedrails?: A Little Help needed moving from lying on your back to sitting on the side of a flat bed without using bedrails?: A Little Help needed moving to and from a bed to a chair (including a wheelchair)?: A Little Help needed standing up from a chair using your arms (e.g., wheelchair or bedside chair)?: A Little Help needed to walk in hospital room?: A Little Help needed climbing 3-5 steps with a railing? : A Little 6 Click Score: 18    End of Session   Activity Tolerance: Patient tolerated treatment well Patient left: in chair;with call bell/phone within reach;with family/visitor present Nurse Communication: Mobility status PT Visit Diagnosis: Muscle weakness (generalized) (M62.81);Difficulty in walking, not elsewhere classified (R26.2)    Time: 9089-9072 PT Time Calculation (min) (ACUTE ONLY): 17 min   Charges:   PT Evaluation $PT Eval Low Complexity: 1 Low   PT General Charges $$ ACUTE PT VISIT: 1 Visit        Dorina HERO. Fairly IV, PT, DPT Physical Therapist- [place]  [facility] 08/14/2024, 10:05 AM

## 2024-08-14 NOTE — Progress Notes (Signed)
 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  POD1: L leg numbness is improved.  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  - mobilize - pain control - DVT prophylaxis - PTOT   Reeves Daisy MD, [facility] Department of Neurosurgery

## 2024-08-14 NOTE — Plan of Care (Signed)
   Problem: Clinical Measurements: Goal: Ability to maintain clinical measurements within normal limits will improve Outcome: Progressing

## 2024-08-14 NOTE — Evaluation (Signed)
 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  He has previously had multiple episodes of left-sided sciatica and right sided sciatica.  Over the past year, his episodes have being longer in his asymptomatic intervals and been getting shorter.  He tried physical therapy without substantial long-term improvement. He underwent L5-S1 transforaminal lumbar interbody fusion on 08/13/24 with Dr. Clois.     Clinical Impressions Patient was seen for OT evaluation this date. 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 Patient lives in a 2 story home (able to stay on first floor) with good support from spouse. Patient has been hospitalized due to scheduled L5-S1 fusion. Patient educated on managing ADLs while adhering to lumbar spinal precautions with good understanding from patient. Patient performed log rolling, SPT from EOB to recliner, sit<>stand from recliner and ambulated 50 feet in hallway to bathroom to perform toilet transfer all without AD and with CGA/SBA. OT educated on LB dressing techniques, he feels confident that his spouse will assist with LB dressing but if not, he is agreeable to obtain AE (reacher, sock aid, long handled loofah).   Patient presents with deficits in standing tolerance and LB mobility, affecting safe and optimal ADL completion. Patient is currently requiring mod A for LB self care tasks.  Patient does not require further OT intervention and he is agreeable. OT recommending BSC, patient agreeable.      If plan is discharge home, recommend the following:   A little help with walking and/or transfers;A lot of help with bathing/dressing/bathroom     Functional Status Assessment   Patient has  had a recent decline in their functional status and demonstrates the ability to make significant improvements in function in a reasonable and predictable amount of time.     Equipment Recommendations   BSC/3in1     Recommendations for Other Services         Precautions/Restrictions   Precautions Precautions: Back Precaution Booklet Issued: Yes (comment) Recall of Precautions/Restrictions: Intact Restrictions Weight Bearing Restrictions Per Provider Order: No     Mobility Bed Mobility Overal bed mobility: Needs Assistance Bed Mobility: Supine to Sit     Supine to sit: Contact guard, Supervision     General bed mobility comments: educated on log rolling technique, patient with good return demo    Transfers Overall transfer level: Needs assistance Equipment used: Rolling walker (2 wheels) Transfers: Sit to/from Stand Sit to Stand: Contact guard assist           General transfer comment: cues for hand placement and pacing      Balance Overall balance assessment: Needs assistance Sitting-balance support: Feet supported, No upper extremity supported Sitting balance-Leahy Scale: Normal     Standing balance support: During functional activity, Reliant on assistive device for balance Standing balance-Leahy Scale: Fair                             ADL either performed or assessed with clinical judgement   ADL Overall ADL's : Needs assistance/impaired Eating/Feeding: Independent       Upper Body Bathing: Modified independent   Lower Body Bathing: Minimal assistance;Adhering to back precautions Lower Body Bathing Details (indicate cue type and reason): spouse  to assist Upper Body Dressing : Modified independent   Lower Body Dressing: Moderate assistance;Adhering to back precautions Lower Body Dressing Details (indicate cue type and reason): spouse to assist Toilet Transfer: Supervision/safety;Grab bars   Toileting- Clothing Manipulation and  Hygiene: Supervision/safety;Adhering to back precautions       Functional mobility during ADLs: Supervision/safety General ADL Comments: will require some A to manage B distal LE due to back precautions and lack of AE, OT recommending AE, patient will discuss with spouse and see if she will be agreeable to assist him, if not, he will order AE     Vision         Perception         Praxis         Pertinent Vitals/Pain Pain Assessment Pain Assessment: 0-10 Pain Score: 3  Pain Location: back Pain Descriptors / Indicators: Aching Pain Intervention(s): Monitored during session     Extremity/Trunk Assessment Upper Extremity Assessment Upper Extremity Assessment: Overall WFL for tasks assessed           Communication Communication Communication: No apparent difficulties   Cognition Arousal: Alert Behavior During Therapy: WFL for tasks assessed/performed Cognition: No apparent impairments                               Following commands: Intact       Cueing  General Comments   Cueing Techniques: Verbal cues      Exercises     Shoulder Instructions      Home Living Family/patient expects to be discharged to:: Private residence Living Arrangements: Spouse/significant other Available Help at Discharge: Family Type of Home: House       Home Layout: Able to live on main level with bedroom/bathroom     Bathroom Shower/Tub: Producer, Television/film/video: Standard Bathroom Accessibility: Yes How Accessible: Accessible via walker Home Equipment: Other (comment) (walking sticl)          Prior Functioning/Environment Prior Level of Function : Independent/Modified Independent             Mobility Comments: active and independent, biking/walking ADLs Comments: independent    OT Problem List: Decreased activity tolerance   OT Treatment/Interventions:        OT Goals(Current goals can be found in the care plan section)    Acute Rehab OT Goals Patient Stated Goal: to go home OT Goal Formulation: With patient Potential to Achieve Goals: Good   OT Frequency:       Co-evaluation              AM-PAC OT 6 Clicks Daily Activity     Outcome Measure Help from another person eating meals?: None Help from another person taking care of personal grooming?: None Help from another person toileting, which includes using toliet, bedpan, or urinal?: A Little Help from another person bathing (including washing, rinsing, drying)?: A Little Help from another person to put on and taking off regular upper body clothing?: None Help from another person to put on and taking off regular lower body clothing?: A Little 6 Click Score: 21   End of Session Nurse Communication: Mobility status  Activity Tolerance: Patient tolerated treatment well Patient left: in chair;with call bell/phone within reach  OT Visit Diagnosis: Unsteadiness on feet (R26.81)                Time: 9197-9164 OT Time Calculation (min): 33 min Charges:  OT General Charges $OT Visit: 1 Visit OT Evaluation $OT Eval Low Complexity: 1 Low OT Treatments $Self Care/Home Management : 23-37 mins  Rogers Clause, OT/L MSOT, 08/14/2024   Daniel Lam Clause 08/14/2024, 8:46 AM

## 2024-08-14 NOTE — Discharge Summary (Signed)
 Physician Discharge Summary  Patient ID: Daniel KIMBALL, MD MRN: [id number] DOB/AGE: 61-11-1960 61 y.o.  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  His symptoms improved after surgery. He tolerated the procedure well and was stable for discharge on POD1. PT ad OT cleared him for discharge.  Consults: None  Significant Diagnostic Studies: radiology: X-Ray: localization of implants  Treatments: surgery: L L5/S1 TLIF  Discharge Exam: Blood pressure (!) 121/56, pulse 70, temperature 98.2 F (36.8 C), temperature source Oral, resp. rate 18, height 5' 10 (1.778 m), weight 97.1 kg, SpO2 96%. General appearance: alert and cooperative CNI MAEW   Disposition: Discharge disposition: 01-Home or Self Care       Discharge Instructions     Discharge patient   Complete by: As directed    Discharge disposition: 01-Home or Self Care   Discharge patient date: 08/14/2024   Incentive spirometry RT   Complete by: As directed       Allergies as of 08/14/2024       Reactions   Alpha-gal Hives   Demerol [meperidine Hcl]    Difficulty voiding   Food    Mammals- Alpha Gal Syndrome   Latex    REACTION: SOB, rash, itching   Morphine And Codeine    Difficulty voiding   Other Other (See Comments)   Mammals- Alpha Gal Syndrome        Medication List     STOP taking these medications    ibuprofen  200 MG tablet Commonly known as: ADVIL        TAKE these medications    acetaminophen  500 MG tablet Commonly known as: TYLENOL  Take 1,000 mg by mouth every 8 (eight) hours as needed for moderate pain (pain  score 4-6).   Airsupra  90-80 MCG/ACT Aero Generic drug: Albuterol -Budesonide  Inhale 2 Inhalations into the lungs every 6 (six) hours as needed.   ALPRAZolam  0.5 MG tablet Commonly known as: XANAX  Take 1 tablet (0.5 mg total) by mouth 3 (three) times daily as needed.   Azelaic Acid  15 % gel Apply a pea sized amount topically to face at bedtime. What changed:  when to take this reasons to take this   budesonide -formoterol  80-4.5 MCG/ACT inhaler Commonly known as: SYMBICORT  Inhale 2 puffs into the lungs 2 (two) times daily.   celecoxib 200 MG capsule Commonly known as: CeleBREX Take 1 capsule (200 mg total) by mouth 2 (two) times daily.   chlorhexidine  4 % external liquid Commonly known as: HIBICLENS  Apply 15 mLs (1 Application total) topically as directed for 30 doses. Use as directed daily for 5 days every other week for 6 weeks.   EPINEPHrine  0.3 mg/0.3 mL Soaj injection Commonly known as: EpiPen  2-Pak Inject 0.3 mg into the muscle as needed for anaphylaxis.   fexofenadine 180 MG tablet Commonly known as: ALLEGRA Take 180 mg by mouth daily as needed for allergies.   FLUoxetine  20 MG capsule Commonly known as: PROZAC  Take 1 capsule (20 mg total) by mouth daily.   methocarbamol  500 MG tablet Commonly known as: ROBAXIN  Take 1 tablet (500 mg total) by mouth every 6 (six) hours as needed for muscle spasms.   mupirocin ointment  2 % Commonly known as: BACTROBAN Place 1 Application into the nose 2 (two) times daily for 60 doses. Use as directed 2 times daily for 5 days every other week for 6 weeks.   oxyCODONE  5 MG immediate release tablet Commonly known as: Oxy IR/ROXICODONE  Take 1 tablet (5 mg total) by mouth every 4 (four) hours as needed for moderate pain (pain score 4-6).   Ozempic (0.25 or 0.5 MG/DOSE) 2 MG/1.5ML Sopn Generic drug: Semaglutide(0.25 or 0.5MG /DOS) Inject 0.25 mg into the skin once a week.   pantoprazole  40 MG tablet Commonly known as:  PROTONIX  Take 1 tablet (40 mg total) by mouth 2 (two) times daily. What changed: when to take this   polyethylene glycol 17 g packet Commonly known as: MIRALAX / GLYCOLAX Take 17 g by mouth daily as needed for mild constipation.   tamsulosin 0.4 MG Caps capsule Commonly known as: FLOMAX Take 1 capsule (0.4 mg total) by mouth daily.   valACYclovir  1000 MG tablet Commonly known as: VALTREX  Take 1 tablet (1,000 mg total) by mouth daily.   Vitamin D  50 MCG (2000 UT) Caps Take 2,000 Units by mouth daily.               Durable Medical Equipment  (From admission, onward)           Start     Ordered   08/14/24 0947  For home use only DME Bedside commode  Once       Question:  Patient needs a bedside commode to treat with the following condition  Answer:  Lumbago with sciatica, left side   08/14/24 0946             Signed: REEVES DAISY 08/14/2024, 12:57 PM

## 2024-08-14 NOTE — Plan of Care (Signed)
  Problem: Education: Goal: Ability to verbalize activity precautions or restrictions will improve Outcome: Progressing   Problem: Activity: Goal: Ability to avoid complications of mobility impairment will improve Outcome: Progressing   Problem: Bowel/Gastric: Goal: Gastrointestinal status for postoperative course will improve Outcome: Progressing   Problem: Pain Management: Goal: Pain level will decrease Outcome: Progressing   Problem: Health Behavior/Discharge Planning: Goal: Identification of resources available to assist in meeting health care needs will improve Outcome: Progressing

## 2024-08-14 NOTE — Progress Notes (Signed)
 DISCHARGE NOTE:   Pt dc with IV removed and dc instructions given. Pt has both TED hose on and in place. Pt voices no questions or concerns at this time. Pt's medications delivered to hospital room. Pt wheeled down to medical mall entrance by staff. Pt's wife provided transportation.

## 2024-08-15 ENCOUNTER — Emergency Department (HOSPITAL_COMMUNITY)
Admission: EM | Admit: 2024-08-15 | Discharge: 2024-08-15 | Disposition: A | Discharge status: Home / Self Care | Attending: Emergency Medicine | Admitting: Emergency Medicine

## 2024-08-15 ENCOUNTER — Encounter: Payer: Self-pay | Admitting: Neurosurgery

## 2024-08-15 ENCOUNTER — Telehealth: Payer: Self-pay

## 2024-08-15 ENCOUNTER — Other Ambulatory Visit: Payer: Self-pay

## 2024-08-15 DIAGNOSIS — R55 Syncope and collapse: Secondary | ICD-10-CM | POA: Insufficient documentation

## 2024-08-15 DIAGNOSIS — R7401 Elevation of levels of liver transaminase levels: Secondary | ICD-10-CM | POA: Insufficient documentation

## 2024-08-15 DIAGNOSIS — R748 Abnormal levels of other serum enzymes: Secondary | ICD-10-CM | POA: Diagnosis not present

## 2024-08-15 DIAGNOSIS — R7989 Other specified abnormal findings of blood chemistry: Secondary | ICD-10-CM

## 2024-08-15 DIAGNOSIS — R110 Nausea: Secondary | ICD-10-CM | POA: Diagnosis not present

## 2024-08-15 DIAGNOSIS — W19XXXA Unspecified fall, initial encounter: Secondary | ICD-10-CM | POA: Diagnosis not present

## 2024-08-15 DIAGNOSIS — Z91040 Latex allergy status: Secondary | ICD-10-CM | POA: Diagnosis not present

## 2024-08-15 DIAGNOSIS — R42 Dizziness and giddiness: Secondary | ICD-10-CM | POA: Diagnosis not present

## 2024-08-15 LAB — URINALYSIS, ROUTINE W REFLEX MICROSCOPIC
Bacteria, UA: NONE SEEN
Bilirubin Urine: NEGATIVE
Glucose, UA: NEGATIVE mg/dL
Hgb urine dipstick: NEGATIVE
Ketones, ur: NEGATIVE mg/dL
Leukocytes,Ua: NEGATIVE
Nitrite: NEGATIVE
Protein, ur: NEGATIVE mg/dL
Specific Gravity, Urine: 1.012 (ref 1.005–1.030)
pH: 6 (ref 5.0–8.0)

## 2024-08-15 LAB — COMPREHENSIVE METABOLIC PANEL WITH GFR
ALT: 159 U/L — ABNORMAL HIGH (ref 0–44)
AST: 174 U/L — ABNORMAL HIGH (ref 15–41)
Albumin: 3.6 g/dL (ref 3.5–5.0)
Alkaline Phosphatase: 103 U/L (ref 38–126)
Anion gap: 6 (ref 5–15)
BUN: 17 mg/dL (ref 8–23)
CO2: 29 mmol/L (ref 22–32)
Calcium: 8.7 mg/dL — ABNORMAL LOW (ref 8.9–10.3)
Chloride: 105 mmol/L (ref 98–111)
Creatinine, Ser: 0.97 mg/dL (ref 0.61–1.24)
GFR, Estimated: >60 mL/min (ref >60)
Glucose, Bld: 112 mg/dL — ABNORMAL HIGH (ref 70–99)
Potassium: 4.1 mmol/L (ref 3.5–5.1)
Sodium: 139 mmol/L (ref 135–145)
Total Bilirubin: 0.4 mg/dL (ref 0.0–1.2)
Total Protein: 6 g/dL — ABNORMAL LOW (ref 6.5–8.1)

## 2024-08-15 LAB — CBC WITH DIFFERENTIAL/PLATELET
Abs Immature Granulocytes: 0.07 K/uL (ref 0.00–0.07)
Basophils Absolute: 0 K/uL (ref 0.0–0.1)
Basophils Relative: 0 %
Eosinophils Absolute: 0 K/uL (ref 0.0–0.5)
Eosinophils Relative: 0 %
HCT: 36.7 % — ABNORMAL LOW (ref 39.0–52.0)
Hemoglobin: 12.3 g/dL — ABNORMAL LOW (ref 13.0–17.0)
Immature Granulocytes: 1 %
Lymphocytes Relative: 8 %
Lymphs Abs: 0.7 K/uL (ref 0.7–4.0)
MCH: 32.7 pg (ref 26.0–34.0)
MCHC: 33.5 g/dL (ref 30.0–36.0)
MCV: 97.6 fL (ref 80.0–100.0)
Monocytes Absolute: 0.7 K/uL (ref 0.1–1.0)
Monocytes Relative: 8 %
Neutro Abs: 7.1 K/uL (ref 1.7–7.7)
Neutrophils Relative %: 83 %
Platelets: 162 K/uL (ref 150–400)
RBC: 3.76 MIL/uL — ABNORMAL LOW (ref 4.22–5.81)
RDW: 12.4 % (ref 11.5–15.5)
WBC: 8.6 K/uL (ref 4.0–10.5)
nRBC: 0 % (ref 0.0–0.2)

## 2024-08-15 LAB — I-STAT CHEM 8, ED
BUN: 17 mg/dL (ref 8–23)
Calcium, Ion: 1.14 mmol/L — ABNORMAL LOW (ref 1.15–1.40)
Chloride: 102 mmol/L (ref 98–111)
Creatinine, Ser: 1.1 mg/dL (ref 0.61–1.24)
Glucose, Bld: 109 mg/dL — ABNORMAL HIGH (ref 70–99)
HCT: 35 % — ABNORMAL LOW (ref 39.0–52.0)
Hemoglobin: 11.9 g/dL — ABNORMAL LOW (ref 13.0–17.0)
Potassium: 4 mmol/L (ref 3.5–5.1)
Sodium: 140 mmol/L (ref 135–145)
TCO2: 29 mmol/L (ref 22–32)

## 2024-08-15 LAB — CK: Total CK: 1670 U/L — ABNORMAL HIGH (ref 49–397)

## 2024-08-15 MED ORDER — KETOROLAC TROMETHAMINE 15 MG/ML IJ SOLN
15.0000 mg | Freq: Once | INTRAMUSCULAR | Status: AC
  Administered 2024-08-15: 15 mg via INTRAVENOUS
  Filled 2024-08-15: qty 1

## 2024-08-15 MED ORDER — LACTATED RINGERS IV BOLUS
1000.0000 mL | Freq: Once | INTRAVENOUS | Status: AC
  Administered 2024-08-15: 1000 mL via INTRAVENOUS

## 2024-08-15 MED ORDER — ACETAMINOPHEN 500 MG PO TABS
1000.0000 mg | ORAL_TABLET | Freq: Once | ORAL | Status: AC
  Administered 2024-08-15: 1000 mg via ORAL
  Filled 2024-08-15: qty 2

## 2024-08-15 NOTE — ED Provider Notes (Signed)
 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   HPI 63 year old male presents with syncope.  He is 2 days postop from L5-S1 TLIF.  He was getting Tylenol  and Toradol  in the hospital.  He took Tylenol  and Celebrex yesterday and was discharged yesterday.  He took a Robaxin  around 3 in the morning.  He got up later to go to the bathroom.  When he was washing his hands he started did not feel right and felt very lightheaded.  His wife caught him and lowered him to the ground but he did briefly pass out for a few seconds.  No injuries to his back or anywhere else including his head.  He never had chest pain, shortness of breath, palpitations.  EMS was called and when they tried to sit him up from the ground he felt recurrently lightheaded and had to lay back down.  Otherwise while lying flat he feels okay.  He feels like he has been drinking fluids okay.  He has no new or worsening pain in his back but currently has about a 5 out of 10 pain.  He denies any weakness or numbness in his legs.  No abdominal pain, headache, etc.  Patient received general anesthesia and was receiving heparin while in the hospital.  He also received Exparel  during the surgery.  He also mentions that since waking up from surgery he has been having diffuse bodyaches, he suspects it might be from the position that he was in.  Prior to Admission medications   Medication Sig Start Date End Date Taking? Authorizing Provider  acetaminophen  (TYLENOL ) 500 MG tablet Take 1,000 mg by mouth every 8 (eight) hours as needed for moderate pain (pain score 4-6).    [provider]  Albuterol -Budesonide  (AIRSUPRA ) 90-80 MCG/ACT AERO Inhale 2 Inhalations into the lungs every 6 (six) hours as needed. 11/24/23   Kozlow, Camellia PARAS, MD  ALPRAZolam  (XANAX ) 0.5 MG tablet Take 1  tablet (0.5 mg total) by mouth 3 (three) times daily as needed. 06/07/24   Cottle, Lorene KANDICE Raddle., MD  Azelaic Acid  15 % gel Apply a pea sized amount topically to face at bedtime. Patient taking differently: Apply 1 application  topically at bedtime as needed (rosacea). 05/17/24     budesonide -formoterol  (SYMBICORT ) 80-4.5 MCG/ACT inhaler Inhale 2 puffs into the lungs 2 (two) times daily. 11/24/23   Kozlow, Camellia PARAS, MD  celecoxib (CELEBREX) 200 MG capsule Take 1 capsule (200 mg total) by mouth 2 (two) times daily. 08/14/24 11/12/24  Clois Fret, MD  chlorhexidine  (HIBICLENS ) 4 % external liquid Apply 15 mLs (1 Application total) topically as directed for 30 doses. Use as directed daily for 5 days every other week for 6 weeks. 08/13/24   Gregory Edsel Ruth, PA  Cholecalciferol (VITAMIN D ) 50 MCG (2000 UT) CAPS Take 2,000 Units by mouth daily.    [provider]  EPINEPHrine  (EPIPEN  2-PAK) 0.3 mg/0.3 mL IJ SOAJ injection Inject 0.3 mg into the muscle as needed for anaphylaxis. 01/27/21   Lowne Chase, Yvonne R, DO  fexofenadine (ALLEGRA) 180 MG tablet Take 180 mg by mouth daily as needed for allergies.    [provider]  FLUoxetine  (PROZAC ) 20 MG capsule Take 1 capsule (20 mg total) by mouth daily. 06/07/24   Geoffry Lorene KANDICE Raddle.,  MD  methocarbamol  (ROBAXIN ) 500 MG tablet Take 1 tablet (500 mg total) by mouth every 6 (six) hours as needed for muscle spasms. 08/14/24   Clois Fret, MD  mupirocin ointment (BACTROBAN) 2 % Place 1 Application into the nose 2 (two) times daily for 60 doses. Use as directed 2 times daily for 5 days every other week for 6 weeks. 08/13/24 09/12/24  Gregory Edsel Ruth, PA  oxyCODONE  (OXY IR/ROXICODONE ) 5 MG immediate release tablet Take 1 tablet (5 mg total) by mouth every 4 (four) hours as needed for moderate pain (pain score 4-6). 08/14/24   Clois Fret, MD  pantoprazole  (PROTONIX ) 40 MG tablet Take 1 tablet (40 mg total) by mouth 2 (two) times  daily. Patient taking differently: Take 40 mg by mouth daily. 05/28/24   Kozlow, Eric J, MD  polyethylene glycol powder (GLYCOLAX/MIRALAX) 17 GM/SCOOP powder Take 17 g by mouth daily as needed for mild constipation. Dissolve 1 capful (17g) in 4-8 ounces of liquid and take by mouth daily as needed. 08/14/24   Clois Fret, MD  Semaglutide,0.25 or 0.5MG /DOS, (OZEMPIC, 0.25 OR 0.5 MG/DOSE,) 2 MG/1.5ML SOPN Inject 0.25 mg into the skin once a week.    [provider]  tamsulosin (FLOMAX) 0.4 MG CAPS capsule Take 1 capsule (0.4 mg total) by mouth daily. 08/14/24   Clois Fret, MD  valACYclovir  (VALTREX ) 1000 MG tablet Take 1 tablet (1,000 mg total) by mouth daily. 04/26/24   Daniel Daniel BRAVO, MD    Allergies: Alpha-gal, Demerol [meperidine hcl], Food, Latex, Morphine and codeine, and Other    Review of Systems  Respiratory:  Negative for shortness of breath.   Cardiovascular:  Negative for chest pain and palpitations.  Musculoskeletal:  Positive for back pain.  Neurological:  Positive for syncope and light-headedness. Negative for headaches.    Updated Vital Signs BP 128/83   Pulse 78   Temp 98.1 F (36.7 C) (Oral)   Resp 17   Ht 5' 10 (1.778 m)   Wt 95.3 kg   SpO2 95%   BMI 30.13 kg/m   Physical Exam Vitals and nursing note reviewed.  Constitutional:      General: He is not in acute distress.    Appearance: He is well-developed. He is not ill-appearing or diaphoretic.  HENT:     Head: Normocephalic and atraumatic.     Mouth/Throat:     Mouth: Mucous membranes are dry.  Cardiovascular:     Rate and Rhythm: Normal rate and regular rhythm.     Heart sounds: Normal heart sounds. No murmur heard. Pulmonary:     Effort: Pulmonary effort is normal.     Breath sounds: Normal breath sounds.  Abdominal:     Palpations: Abdomen is soft.     Tenderness: There is no abdominal tenderness.  Musculoskeletal:     Comments: Lumbar back incision appears C/D/I. No obvious  swelling/induration.  Skin:    General: Skin is warm and dry.  Neurological:     Mental Status: He is alert.     Comments: Normal speech, no facial droop. 5/5 strength in BUE. Limited strength evaluation in legs due to the pain it causes in his back. Grossly normal sensation in both legs.     (all labs ordered are listed, but only abnormal results are displayed) Labs Reviewed  COMPREHENSIVE METABOLIC PANEL WITH GFR - Abnormal; Notable for the following components:      Result Value   Glucose, Bld 112 (*)    Calcium 8.7 (*)  Total Protein 6.0 (*)    AST 174 (*)    ALT 159 (*)    All other components within normal limits  CBC WITH DIFFERENTIAL/PLATELET - Abnormal; Notable for the following components:   RBC 3.76 (*)    Hemoglobin 12.3 (*)    HCT 36.7 (*)    All other components within normal limits  CK - Abnormal; Notable for the following components:   Total CK 1,670 (*)    All other components within normal limits  I-STAT CHEM 8, ED - Abnormal; Notable for the following components:   Glucose, Bld 109 (*)    Calcium, Ion 1.14 (*)    Hemoglobin 11.9 (*)    HCT 35.0 (*)    All other components within normal limits  URINALYSIS, ROUTINE W REFLEX MICROSCOPIC    EKG: EKG Interpretation Date/Time:  Wednesday August 15 2024 08:56:34 EST Ventricular Rate:  64 PR Interval:  158 QRS Duration:  100 QT Interval:  380 QTC Calculation: 392 R Axis:   44  Text Interpretation: Sinus rhythm no acute ST/T changes no significant change since Oct 2024 Confirmed by Freddi Hamilton [phone]) on 08/15/2024 10:07:19 AM  Radiology: No results found.    Procedures   Medications Ordered in the ED  lactated ringers  bolus 1,000 mL (0 mLs Intravenous Stopped 08/15/24 1343)  ketorolac  (TORADOL ) 15 MG/ML injection 15 mg (15 mg Intravenous Given 08/15/24 0948)  acetaminophen  (TYLENOL ) tablet 1,000 mg (1,000 mg Oral Given 08/15/24 0947)  lactated ringers  bolus 1,000 mL (0 mLs Intravenous Stopped  08/15/24 1343)                                    Medical Decision Making Amount and/or Complexity of Data Reviewed External Data Reviewed: notes. Labs: ordered.    Details: Mild elevated CK.  Normal renal function. ECG/medicine tests: ordered and independent interpretation performed.    Details: No arrhythmia or ischemia  Risk OTC drugs. Prescription drug management.   Patient was given a couple liters of fluids.  He is starting to feel a lot better and was able to ambulate around the room.  He has had no chest pain or shortness of breath either before, during, or after the syncopal episode.  I suspect this is probably medication related, possibly from the Exparel  as well as the Robaxin .  I have encouraged him to push fluids but at this point he is able to walk around and feels like he is well enough to go home.  We did discuss the possibility of a possible PE given he is postop but he has no other signs or symptoms of this and we have decided together to hold off on CTA.  His abnormal LFT could be related to the elevated CK.  He will need to get this rechecked by his PCP.  He has no right upper quadrant or abdominal pain or tenderness. He has been taking 1000 mg of Tylenol  with each dose but not more than 4000 mg in a 24-hour period.  I did let his neurosurgeon, Dr. Katrina know of the patient's return ED visit.  He appears stable for discharge with return precautions.    Final diagnoses:  Syncope and collapse  Abnormal LFTs  Elevated CK    ED Discharge Orders     None          Freddi Hamilton, MD 08/15/24 [phone]

## 2024-08-15 NOTE — Telephone Encounter (Signed)
 [name]

## 2024-08-15 NOTE — Discharge Instructions (Signed)
 Be sure to increase your fluid intake.  Have caution if you are to take Robaxin  again as this may have contributed to your passing out.  Your liver function tests and CK level were mildly abnormal.  Follow-up with your primary care provider to get these rechecked.  Return to the ER for any new or worsening symptoms.

## 2024-08-15 NOTE — ED Triage Notes (Addendum)
 Pt BIB EMS from home, post op lumbar procedure Monday. Syncopal episode from bathroom, blurred vision, nausea, lost consciousness for at least 5 seconds. Wife helped to ground, denies hitting head. Heparin yesterday before d/c denies thinners. 500 NS with EMS BP 130/80 laying. CBG 130.

## 2024-08-15 NOTE — Telephone Encounter (Signed)
 Dr Clois sent a mychart message to the patient.

## 2024-08-16 ENCOUNTER — Other Ambulatory Visit (HOSPITAL_BASED_OUTPATIENT_CLINIC_OR_DEPARTMENT_OTHER): Payer: Self-pay

## 2024-08-16 MED ORDER — CHLORHEXIDINE GLUCONATE 4 % EX SOLN
15.0000 mL | Freq: Every day | CUTANEOUS | 1 refills | Status: DC
  Filled 2024-08-16: qty 236, 15d supply, fill #0

## 2024-08-16 MED ORDER — MUPIROCIN 2 % EX OINT
TOPICAL_OINTMENT | CUTANEOUS | 0 refills | Status: DC
  Filled 2024-08-16: qty 66, 42d supply, fill #0

## 2024-08-26 NOTE — Progress Notes (Unsigned)
   REFERRING PHYSICIAN:  [name] Rosalea [address] [place],  [place] [address]  DOS: Left L5-S1 TLIF/PSF  HISTORY OF PRESENT ILLNESS: Daniel FORBES Mech, Daniel Lam is approximately 2 weeks status post above surgery. Was given celebrex, robaxin , oxycodone  on discharge from the hospital.   Was seen back in ED on 08/15/24 for sycope. Did he follow up with PCP regarding repeat CK labs?***  Preop back and left leg pain.***      PHYSICAL EXAMINATION:  General: Patient is well developed, well nourished, calm, collected, and in no apparent distress.   NEUROLOGICAL:  General: In no acute distress.   Awake, alert, oriented to person, place, and time.  Pupils equal round and reactive to light.  Facial tone is symmetric.     Strength:            Side Iliopsoas Quads Hamstring PF DF EHL  R 5 5 5 5 5 5   L 5 5 5 5 5 5    Incision c/d/i   ROS (Neurologic):  Negative except as noted above  IMAGING: Nothing new to review.   ASSESSMENT/PLAN:  Daniel FORBES Mech, Daniel Lam is doing well s/p above surgery. Treatment options reviewed with patient and following plan made:   - I have advised the patient to lift up to 10 pounds until 6 weeks after surgery (follow up with Dr. Clois).  - Reviewed wound care.  - No bending, twisting, or lifting.  - Continue on current medications including ***.  - Preop nasal swab was positive for staph- patient is to continue mupirocin and chloraprep x 6 weeks.  - Follow up as scheduled in 4 weeks and prn.   Advised to contact the office if any questions or concerns arise.  Glade Boys PA-C Department of neurosurgery

## 2024-08-27 ENCOUNTER — Ambulatory Visit (INDEPENDENT_AMBULATORY_CARE_PROVIDER_SITE_OTHER): Admitting: Orthopedic Surgery

## 2024-08-27 ENCOUNTER — Encounter: Payer: Self-pay | Admitting: Orthopedic Surgery

## 2024-08-27 VITALS — BP 130/88 | Temp 98.7°F | Ht 70.0 in | Wt 210.0 lb

## 2024-08-27 DIAGNOSIS — M532X6 Spinal instabilities, lumbar region: Secondary | ICD-10-CM

## 2024-08-27 DIAGNOSIS — Z981 Arthrodesis status: Secondary | ICD-10-CM

## 2024-09-03 ENCOUNTER — Other Ambulatory Visit (INDEPENDENT_AMBULATORY_CARE_PROVIDER_SITE_OTHER)

## 2024-09-03 ENCOUNTER — Other Ambulatory Visit: Payer: Self-pay | Admitting: Family Medicine

## 2024-09-03 DIAGNOSIS — R748 Abnormal levels of other serum enzymes: Secondary | ICD-10-CM

## 2024-09-04 LAB — CBC WITH DIFFERENTIAL/PLATELET
Basophils Absolute: 0 K/uL (ref 0.0–0.1)
Basophils Relative: 0.7 % (ref 0.0–3.0)
Eosinophils Absolute: 0.2 K/uL (ref 0.0–0.7)
Eosinophils Relative: 3.1 % (ref 0.0–5.0)
HCT: 39.4 % (ref 39.0–52.0)
Hemoglobin: 13.6 g/dL (ref 13.0–17.0)
Lymphocytes Relative: 18.3 % (ref 12.0–46.0)
Lymphs Abs: 1.1 K/uL (ref 0.7–4.0)
MCHC: 34.6 g/dL (ref 30.0–36.0)
MCV: 95.7 fl (ref 78.0–100.0)
Monocytes Absolute: 0.5 K/uL (ref 0.1–1.0)
Monocytes Relative: 8.4 % (ref 3.0–12.0)
Neutro Abs: 4 K/uL (ref 1.4–7.7)
Neutrophils Relative %: 69.5 % (ref 43.0–77.0)
Platelets: 253 K/uL (ref 150.0–400.0)
RBC: 4.11 Mil/uL — ABNORMAL LOW (ref 4.22–5.81)
RDW: 12.6 % (ref 11.5–15.5)
WBC: 5.8 K/uL (ref 4.0–10.5)

## 2024-09-04 LAB — COMPREHENSIVE METABOLIC PANEL WITH GFR
ALT: 33 U/L (ref 0–53)
AST: 23 U/L (ref 0–37)
Albumin: 4.3 g/dL (ref 3.5–5.2)
Alkaline Phosphatase: 117 U/L (ref 39–117)
BUN: 16 mg/dL (ref 6–23)
CO2: 31 meq/L (ref 19–32)
Calcium: 9.5 mg/dL (ref 8.4–10.5)
Chloride: 101 meq/L (ref 96–112)
Creatinine, Ser: 1.03 mg/dL (ref 0.40–1.50)
GFR: 78.35 mL/min (ref >60.00)
Glucose, Bld: 92 mg/dL (ref 70–99)
Potassium: 4.3 meq/L (ref 3.5–5.1)
Sodium: 139 meq/L (ref 135–145)
Total Bilirubin: 0.4 mg/dL (ref 0.2–1.2)
Total Protein: 6.7 g/dL (ref 6.0–8.3)

## 2024-09-04 LAB — CK: Total CK: 84 U/L (ref 17–232)

## 2024-09-05 ENCOUNTER — Ambulatory Visit: Payer: Self-pay | Admitting: Family Medicine

## 2024-09-17 ENCOUNTER — Other Ambulatory Visit: Payer: Self-pay | Admitting: Neurosurgery

## 2024-09-17 DIAGNOSIS — Z981 Arthrodesis status: Secondary | ICD-10-CM

## 2024-09-25 ENCOUNTER — Encounter (HOSPITAL_BASED_OUTPATIENT_CLINIC_OR_DEPARTMENT_OTHER): Payer: Self-pay | Admitting: Pharmacist

## 2024-09-25 ENCOUNTER — Other Ambulatory Visit: Payer: Self-pay | Admitting: Family Medicine

## 2024-09-25 ENCOUNTER — Other Ambulatory Visit (HOSPITAL_BASED_OUTPATIENT_CLINIC_OR_DEPARTMENT_OTHER): Payer: Self-pay

## 2024-09-25 ENCOUNTER — Other Ambulatory Visit

## 2024-09-25 ENCOUNTER — Encounter: Admitting: Neurosurgery

## 2024-09-25 DIAGNOSIS — G4733 Obstructive sleep apnea (adult) (pediatric): Secondary | ICD-10-CM

## 2024-09-25 MED ORDER — ZEPBOUND 2.5 MG/0.5ML ~~LOC~~ SOAJ
2.5000 mg | SUBCUTANEOUS | 0 refills | Status: DC
  Filled 2024-09-25: qty 2, 28d supply, fill #0

## 2024-09-26 ENCOUNTER — Other Ambulatory Visit (HOSPITAL_BASED_OUTPATIENT_CLINIC_OR_DEPARTMENT_OTHER): Payer: Self-pay

## 2024-09-26 ENCOUNTER — Other Ambulatory Visit: Payer: Self-pay

## 2024-09-26 MED ORDER — CLINDAMYCIN HCL 150 MG PO CAPS
150.0000 mg | ORAL_CAPSULE | Freq: Three times a day (TID) | ORAL | 0 refills | Status: AC
  Filled 2024-09-26: qty 30, 10d supply, fill #0

## 2024-09-27 ENCOUNTER — Other Ambulatory Visit

## 2024-09-27 ENCOUNTER — Encounter: Payer: Self-pay | Admitting: Neurosurgery

## 2024-09-27 ENCOUNTER — Ambulatory Visit: Admitting: Neurosurgery

## 2024-09-27 VITALS — BP 106/76 | Temp 98.4°F | Ht 70.0 in | Wt 212.0 lb

## 2024-09-27 DIAGNOSIS — Z09 Encounter for follow-up examination after completed treatment for conditions other than malignant neoplasm: Secondary | ICD-10-CM

## 2024-09-27 DIAGNOSIS — Z981 Arthrodesis status: Secondary | ICD-10-CM

## 2024-09-27 DIAGNOSIS — M532X6 Spinal instabilities, lumbar region: Secondary | ICD-10-CM

## 2024-09-27 DIAGNOSIS — M4310 Spondylolisthesis, site unspecified: Secondary | ICD-10-CM

## 2024-09-27 NOTE — Progress Notes (Signed)
° °  REFERRING PHYSICIAN:  [name] Lam [address] [place],  [place] [address]  DOS: 08/13/24  Left L5-S1 TLIF/PSF  HISTORY OF PRESENT ILLNESS: Daniel Lam Daniel Lam Lam is status post above surgery.    His left leg feels much better.  He is still having some minor numbness in his right foot.  He still has some back pain, but is overall doing well.   PHYSICAL EXAMINATION:  General: Patient is well developed, well nourished, calm, collected, and in no apparent distress.   NEUROLOGICAL:  General: In no acute distress.   Awake, alert, oriented to person, place, and time.  Pupils equal round and reactive to light.  Facial tone is symmetric.     Strength:           Side Iliopsoas Quads Hamstring PF DF EHL  R 5 5 5 5 5 5   L 5 5 5 5 5 5    Incisions c/d/i   ROS (Neurologic):  Negative except as noted above  IMAGING: No complications noted.  ASSESSMENT/PLAN:  Daniel Lam Daniel Lam Lam is doing well s/p above surgery.    He will start to increase activity slowly.  We reviewed activity limitations.  He is ready to return to work.  He had elevated creatinine kinase when he presented to the emergency department.  I think it was almost certainly due to surgery.  We will see him back in 6 weeks    Daniel Daisy Lam Department of neurosurgery

## 2024-10-12 ENCOUNTER — Other Ambulatory Visit (HOSPITAL_BASED_OUTPATIENT_CLINIC_OR_DEPARTMENT_OTHER): Payer: Self-pay

## 2024-10-15 ENCOUNTER — Other Ambulatory Visit (HOSPITAL_BASED_OUTPATIENT_CLINIC_OR_DEPARTMENT_OTHER): Payer: Self-pay

## 2024-10-18 ENCOUNTER — Other Ambulatory Visit: Payer: Self-pay | Admitting: Family Medicine

## 2024-10-18 ENCOUNTER — Other Ambulatory Visit (HOSPITAL_BASED_OUTPATIENT_CLINIC_OR_DEPARTMENT_OTHER): Payer: Self-pay

## 2024-10-18 MED ORDER — VALACYCLOVIR HCL 500 MG PO TABS
500.0000 mg | ORAL_TABLET | Freq: Every day | ORAL | 3 refills | Status: AC
  Filled 2024-10-18: qty 90, 90d supply, fill #0

## 2024-10-30 ENCOUNTER — Other Ambulatory Visit: Payer: Self-pay | Admitting: Neurosurgery

## 2024-10-30 DIAGNOSIS — Z981 Arthrodesis status: Secondary | ICD-10-CM

## 2024-11-05 ENCOUNTER — Other Ambulatory Visit

## 2024-11-05 ENCOUNTER — Encounter: Admitting: Orthopedic Surgery

## 2024-11-06 NOTE — Progress Notes (Unsigned)
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 Left leg pain was better but he still had some numbness in right foot and some mild back pain.   He is here for follow up.   He is doing well. He has some mild left sided back soreness that gets worse with bending, reaching.[name] He has intermittent numbness and tingling in left leg, but this is much better since surgery.   He takes prn celebrex /tylenol .   He wants to PT at [facility] in [place].   PHYSICAL EXAMINATION:  General: Patient is well developed, well nourished, calm, collected, and in no apparent distress.   NEUROLOGICAL:  General: In no acute distress.   Awake, alert, oriented to person, place, and time.  Pupils equal round and reactive to light.  Facial tone is symmetric.     Strength:         Side Iliopsoas Quads Hamstring PF DF EHL  R 5 5 5 5 5 5   L 5 5 5 5 5 5    Incisions well healed   ROS (Neurologic):  Negative except as noted above  IMAGING: Lumbar xrays dated 11/08/24:  No complications noted.  Report for above xrays not yet available. Dr. Clois reviewed above xrays. He is not concerned about possible lucency on previous xray report.    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 Treatment options reviewed with patient and following plan made:   - He can return to activity as tolerated.  - PT orders to [facility] in [place]. - He will follow up with Dr. Clois in 3 months with repeat xrays.   Advised to contact the office if any questions or concerns arise.  Glade Boys PA-C Department of neurosurgery "

## 2024-11-07 ENCOUNTER — Other Ambulatory Visit

## 2024-11-08 ENCOUNTER — Ambulatory Visit

## 2024-11-08 ENCOUNTER — Encounter: Payer: Self-pay | Admitting: Orthopedic Surgery

## 2024-11-08 ENCOUNTER — Ambulatory Visit: Admitting: Orthopedic Surgery

## 2024-11-08 VITALS — BP 132/78 | Temp 98.2°F | Ht 70.0 in | Wt 212.0 lb

## 2024-11-08 DIAGNOSIS — M532X6 Spinal instabilities, lumbar region: Secondary | ICD-10-CM

## 2024-11-08 DIAGNOSIS — Z981 Arthrodesis status: Secondary | ICD-10-CM

## 2024-11-08 DIAGNOSIS — Z09 Encounter for follow-up examination after completed treatment for conditions other than malignant neoplasm: Secondary | ICD-10-CM

## 2024-11-15 ENCOUNTER — Telehealth (HOSPITAL_BASED_OUTPATIENT_CLINIC_OR_DEPARTMENT_OTHER): Payer: Self-pay | Admitting: Physical Therapy

## 2024-11-15 ENCOUNTER — Telehealth: Payer: Self-pay | Admitting: Neurosurgery

## 2024-11-15 ENCOUNTER — Encounter (HOSPITAL_BASED_OUTPATIENT_CLINIC_OR_DEPARTMENT_OTHER): Payer: Self-pay | Admitting: Physical Therapy

## 2024-11-15 ENCOUNTER — Other Ambulatory Visit: Payer: Self-pay

## 2024-11-15 ENCOUNTER — Ambulatory Visit (HOSPITAL_BASED_OUTPATIENT_CLINIC_OR_DEPARTMENT_OTHER): Admitting: Physical Therapy

## 2024-11-15 DIAGNOSIS — M6281 Muscle weakness (generalized): Secondary | ICD-10-CM

## 2024-11-15 DIAGNOSIS — M5459 Other low back pain: Secondary | ICD-10-CM

## 2024-11-15 DIAGNOSIS — R29898 Other symptoms and signs involving the musculoskeletal system: Secondary | ICD-10-CM

## 2024-11-15 NOTE — Telephone Encounter (Signed)
 Kristen with [facility] is calling to obtain precautions for this patient. She is wanting to confirm bending, lifting, and twisting.

## 2024-11-15 NOTE — Telephone Encounter (Signed)
 Reached out to referring provider seeking clarification on if BLT precautions post lumbar surgery have been lifted. Left message with office staff, awaiting call back.  Josette Rough, PT, DPT 11/15/24 11:27 AM

## 2024-11-15 NOTE — Therapy (Signed)
 Per message from neurosurgery, pt with no further restrictions, able to participate in all PT tasks/exercises as tolerated.  Josette Rough, PT, DPT 11/15/24 2:13 PM

## 2024-11-15 NOTE — Telephone Encounter (Signed)
 Called Kristen to confirm that per the latest note from [place], [place], activity as tolerated with no restrictions. Number called was business number with no answer. Left voicemail detailing this information and left callback number

## 2024-11-15 NOTE — Therapy (Signed)
 " OUTPATIENT PHYSICAL THERAPY THORACOLUMBAR EVALUATION   Patient Name: Daniel KIMBRELL, MD MRN: [id number] DOB:06/27/1961, 64 y.o., male Today's Date: 11/15/2024  END OF SESSION:  PT End of Session - 11/15/24 0858     Visit Number 1    Number of Visits 9    Date for Recertification  01/10/25    Authorization Type [facility] [employer]    Progress Note Due on Visit 10    PT Start Time 0848    PT Stop Time 0927    PT Time Calculation (min) 39 min    Activity Tolerance Patient tolerated treatment well    Behavior During Therapy WFL for tasks assessed/performed          Past Medical History:  Diagnosis Date   Anxiety state, unspecified    Asthma    Atypical mole 2008   Complication of anesthesia    had some difficulty voiding   Dermatitis    anal dermatitis   GERD (gastroesophageal reflux disease)    Hemorrhoids    Herpes simplex without mention of complication    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   Numbness and tingling    Other and unspecified anterior pituitary hyperfunction    Sleep apnea    Unspecified asthma(493.90)    controlled   Past Surgical History:  Procedure Laterality Date   APPENDECTOMY     APPLICATION OF INTRAOPERATIVE CT SCAN N/A 08/13/2024   Procedure: APPLICATION OF INTRAOPERATIVE CT SCAN;  Surgeon: Clois Fret, MD;  Location: [facility];  Service: Neurosurgery;  Laterality: N/A;   KNEE ARTHROSCOPY  09/01/2011   Procedure: ARTHROSCOPY KNEE;  Surgeon: Norleen LITTIE Gavel;  Location: [facility];  Service: Orthopedics;  Laterality: Left;  medial and lateral meniscus tear debridment  and chondroplasty   LASIK     ORCHIECTOMY     Lt. 08/2009   TRANSFORAMINAL LUMBAR INTERBODY FUSION W/ MIS 1 LEVEL Left 08/13/2024   Procedure: LEFT L5-S1 MINIMALLY INVASIVE (MIS) TRANSFORAMINAL LUMBAR INTERBODY FUSION (TLIF);  Surgeon: Clois Fret, MD;  Location: [facility];  Service: Neurosurgery;  Laterality: Left;  LEFT  L5-S1 MINIMALLY INVASIVE (MIS) TRANSFORAMINAL LUMBAR INTERBODY FUSION (TLIF)   Patient Active Problem List   Diagnosis Date Noted   S/P lumbar fusion 08/13/2024   Chronic bilateral low back pain with left-sided sciatica 08/13/2024   Retrolisthesis 08/13/2024   Dependence on CPAP ventilation 03/27/2023   Abnormal transaminases 08/08/2019   Viral respiratory illness 12/24/2018   Preventative health care 08/22/2018   Tear of LCL (lateral collateral ligament) of knee, left, initial encounter 08/18/2017   Lumbar radiculopathy 01/20/2017   Paresthesia 08/18/2016   Low serum testosterone  12/16/2015   Metatarsalgia of left foot 09/13/2013   OSA on CPAP 09/30/2011   Hypogonadism male 07/06/2011   Herpes simplex virus (HSV) infection 12/11/2010   Anxiety state 12/11/2010   Asthma 12/11/2010   SEMINOMA 12/11/2009    PCP: Antonio Cyndee Rockers DO   REFERRING PROVIDER: Hilma Hastings, PA-C  REFERRING DIAG:  Diagnosis  M53.2X6 (ICD-10-CM) - Lumbar spine instability  Z98.1 (ICD-10-CM) - S/P lumbar fusion    Rationale for Evaluation and Treatment: Rehabilitation  THERAPY DIAG:  Other low back pain  Muscle weakness (generalized)  Other symptoms and signs involving the musculoskeletal system  ONSET DATE: L L5-S1 TLIF 08/13/24  SUBJECTIVE:  SUBJECTIVE STATEMENT:  Still having some back pain- had this pain for 2 years that ran into the buttock/leg with walking, finally had surgery for L5-S1 fusion in November. Started walking program about a month after surgery, was painful at first but now its better. Have started upper body lifting. I wake up with pain in the morning, having pain when bending forward and reaching to put shoes/socks on. Have to use sock aide and reacher. Not having radiating pain at this point  but still somewhat pain limited. Have been sticking to B/L/T precautions after surgery. Wear a brace maybe 20% of the time when at work.   PERTINENT HISTORY:   See above   PAIN:  Are you having pain? When first waking up, pain in low back can be 3-4/10 and very stiff but stretches bring it down to a 2/10. Stiff tight feeling. Stretches help quite a bit.   PRECAUTIONS: activity as tolerated per MD note- will reach out to confirm this means B/L/T precautions have been lifted   RED FLAGS: None   WEIGHT BEARING RESTRICTIONS: No  FALLS:  Has patient fallen in last 6 months? No  LIVING ENVIRONMENT: Lives with: lives with their family Lives in: House/apartment   OCCUPATION:  MD   PLOF: Independent, Independent with basic ADLs, Independent with gait, and Independent with transfers  PATIENT GOALS: be able to do ADLs without as much pain, get back to  stationary bicycling/hiking as much as able   NEXT MD VISIT: surgeon in 3 months   OBJECTIVE:  Note: Objective measures were completed at Evaluation unless otherwise noted.  DIAGNOSTIC FINDINGS:   EXAM: 2-3 VIEW(S) XRAY OF THE LUMBAR SPINE 11/08/2024 10:13:36 AM   COMPARISON: Lumbar spine x-ray 09/27/2024.   CLINICAL HISTORY: follow up fusion Follow-up fusion.   FINDINGS:   LUMBAR SPINE: BONES: Vertebral body heights are maintained. Alignment is anatomic. Lucency is again seen around the bilateral pedicle screws at L5 and S1 measuring up to 3 mm, unchanged from prior. Hardware is stable in position. No evidence for fracture.   DISCS AND DEGENERATIVE CHANGES: Disc spacer is also unchanged. No severe degenerative changes.   SOFT TISSUES: No acute abnormality.   IMPRESSION: 1. Stable hardware position with unchanged lucency around the bilateral pedicle screws at L5 and S1 measuring up to 3 mm. 2. No evidence of fracture.  PATIENT SURVEYS:  PSFS: THE PATIENT SPECIFIC FUNCTIONAL SCALE  Place score of 0-10 (0 =  unable to perform activity and 10 = able to perform activity at the same level as before injury or problem)  Activity Date: 11/15/24 eval    Getting shoes/socks on  5    2. Walking for exercise  6    3.  ADLs  5    4.      Total Score 5.3      Total Score = Sum of activity scores/number of activities  Minimally Detectable Change: 3 points (for single activity); 2 points (for average score)  [facility] (nd). The Patient Specific Functional Scale . Retrieved from [email or web address]   COGNITION: Overall cognitive status: Within functional limits for tasks assessed     SENSATION: On and off, not consistent- when it happens its in the soles of both feet   MUSCLE LENGTH:  HS: L 25% limited, R 40% limited  Piriformis: L 50% limited, R 75% limited   POSTURE: rounded shoulders, forward head, increased lumbar lordosis, decreased lumbar lordosis, flexed trunk , and expected post-op findings lx spine  PALPATION:  R lumbar paraspinals TTP at L4 level  LUMBAR ROM:   AROM eval  Flexion   Extension   Right lateral flexion   Left lateral flexion   Right rotation   Left rotation    (Blank rows = not tested)  Did not assess lumbar ROM at eval- will check with referring about if BLT precautions have been lifted first    LOWER EXTREMITY MMT:    MMT Right eval Left eval  Hip flexion 4+ 4+  Hip extension    Hip abduction 4 4  Hip adduction    Hip internal rotation    Hip external rotation    Knee flexion 4+ 4  Knee extension 5 4+  Ankle dorsiflexion 5 5  Ankle plantarflexion    Ankle inversion    Ankle eversion     (Blank rows = not tested)    FUNCTIONAL TESTS:   SLS 20 seconds B, more tremulous on L    TREATMENT DATE:   11/15/24  Eval, POC, HEP   Nustep L5x6 minutes BLEs only, seat 10  HEP practice and discussion                                                                                                                                    PATIENT EDUCATION:  Education details: exam findings, POC, HEP, discussed that PT will reach out to referring to ensure BLT precautions have been lifted prior to working on lumbar ROM Person educated: Patient Education method: Programmer, Multimedia, Facilities Manager, Verbal cues, and Handouts Education comprehension: verbalized understanding, returned demonstration, and verbal cues required  HOME EXERCISE PROGRAM:  Access Code: [email or web address] URL: [email or web address] Date: 11/15/2024 Prepared by: Josette Rough   ASSESSMENT:  CLINICAL IMPRESSION: Patient is a 64 y.o. M who was seen today for physical therapy evaluation and treatment for  Diagnosis  M53.2X6 (ICD-10-CM) - Lumbar spine instability  Z98.1 (ICD-10-CM) - S/P lumbar fusion  . Of note, referring MD note says activity as tolerated but its a bit unclear if BLT post-op precautions have been lifted- will reach out. Overall he presents as expected following recent lumbar surgery, will progress as tolerated and appropriate.   OBJECTIVE IMPAIRMENTS: Abnormal gait, decreased activity tolerance, decreased mobility, difficulty walking, decreased ROM, decreased strength, increased fascial restrictions, increased muscle spasms, impaired flexibility, impaired sensation, improper body mechanics, postural dysfunction, and pain.   ACTIVITY LIMITATIONS: carrying, lifting, bending, squatting, transfers, hygiene/grooming, and locomotion level  PARTICIPATION LIMITATIONS: meal prep, cleaning, laundry, driving, shopping, community activity, and occupation  PERSONAL FACTORS: Fitness, Past/current experiences, Profession, Social background, and Time since onset of injury/illness/exacerbation are also affecting patient's functional outcome.   REHAB POTENTIAL: Excellent  CLINICAL DECISION MAKING: Stable/uncomplicated  EVALUATION COMPLEXITY: Low   GOALS: Goals reviewed with patient? No  SHORT TERM  GOALS: Target date: 12/13/2024    Will be compliant with appropriate progressive HEP  Baseline: Goal status: INITIAL  2.  Sx with  walking to have improved by at least 25% Baseline:  Goal status: INITIAL    LONG TERM GOALS: Target date: 01/10/2025    MMT to be at least 4+/5 Baseline:  Goal status: INITIAL  2.  Lumbar AROM to be within 75% of normal all planes  Baseline:  Goal status: INITIAL  3.  Morning stiffness to have improved by 75% Baseline:  Goal status: INITIAL  4.  Pain with walking to have resolved by at least 75% Baseline:  Goal status: INITIAL  5.  Will have been able to return to all desired gym based exercises and required work based tasks with pain no more than 2/10  Baseline:  Goal status: INITIAL  6.  PSFS to have improved by at least 3 points  Baseline:  Goal status: INITIAL  PLAN:  PT FREQUENCY: 1x/week  PT DURATION: 8 weeks  PLANNED INTERVENTIONS: 97750- Physical Performance Testing, 97110-Therapeutic exercises, 97530- Therapeutic activity, [id number]- Neuromuscular re-education, 97535- Self Care, [id number]- Manual therapy, [id number]- Gait training, and [id number]- Aquatic Therapy.  PLAN FOR NEXT SESSION: did MD clarify BLT precautions? Core strength, hip ROM and strength, lumbar ROM when cleared by MD  Josette Rough, PT, DPT 11/15/24 9:37 AM  "

## 2024-11-23 ENCOUNTER — Encounter (HOSPITAL_BASED_OUTPATIENT_CLINIC_OR_DEPARTMENT_OTHER)

## 2024-12-13 ENCOUNTER — Encounter (HOSPITAL_BASED_OUTPATIENT_CLINIC_OR_DEPARTMENT_OTHER): Admitting: Physical Therapy

## 2024-12-20 ENCOUNTER — Encounter (HOSPITAL_BASED_OUTPATIENT_CLINIC_OR_DEPARTMENT_OTHER): Admitting: Physical Therapy

## 2024-12-27 ENCOUNTER — Encounter (HOSPITAL_BASED_OUTPATIENT_CLINIC_OR_DEPARTMENT_OTHER): Admitting: Physical Therapy

## 2025-01-03 ENCOUNTER — Encounter (HOSPITAL_BASED_OUTPATIENT_CLINIC_OR_DEPARTMENT_OTHER)

## 2025-01-10 ENCOUNTER — Encounter (HOSPITAL_BASED_OUTPATIENT_CLINIC_OR_DEPARTMENT_OTHER): Admitting: Physical Therapy

## 2025-02-12 ENCOUNTER — Ambulatory Visit: Admitting: Neurosurgery

## 2025-06-06 ENCOUNTER — Ambulatory Visit: Admitting: Psychiatry
